# Patient Record
Sex: Male | Born: 1935 | Race: White | Hispanic: No | Smoking: Never smoker
Health system: Southern US, Community
[De-identification: ages and names within clinical notes are randomized; demographics above are authoritative.]

## PROBLEM LIST (undated history)

## (undated) DIAGNOSIS — Z8719 Personal history of other diseases of the digestive system: Secondary | ICD-10-CM

## (undated) DIAGNOSIS — IMO0001 Reserved for inherently not codable concepts without codable children: Secondary | ICD-10-CM

## (undated) DIAGNOSIS — I509 Heart failure, unspecified: Secondary | ICD-10-CM

## (undated) DIAGNOSIS — K5790 Diverticulosis of intestine, part unspecified, without perforation or abscess without bleeding: Secondary | ICD-10-CM

## (undated) DIAGNOSIS — I453 Trifascicular block: Secondary | ICD-10-CM

## (undated) DIAGNOSIS — E785 Hyperlipidemia, unspecified: Secondary | ICD-10-CM

## (undated) DIAGNOSIS — D469 Myelodysplastic syndrome, unspecified: Secondary | ICD-10-CM

## (undated) DIAGNOSIS — Z9289 Personal history of other medical treatment: Secondary | ICD-10-CM

## (undated) DIAGNOSIS — M1990 Unspecified osteoarthritis, unspecified site: Secondary | ICD-10-CM

## (undated) DIAGNOSIS — M722 Plantar fascial fibromatosis: Secondary | ICD-10-CM

## (undated) DIAGNOSIS — I351 Nonrheumatic aortic (valve) insufficiency: Secondary | ICD-10-CM

## (undated) DIAGNOSIS — J45909 Unspecified asthma, uncomplicated: Secondary | ICD-10-CM

## (undated) DIAGNOSIS — I2510 Atherosclerotic heart disease of native coronary artery without angina pectoris: Secondary | ICD-10-CM

## (undated) DIAGNOSIS — I10 Essential (primary) hypertension: Secondary | ICD-10-CM

## (undated) HISTORY — DX: Unspecified asthma, uncomplicated: J45.909

## (undated) HISTORY — PX: TONSILLECTOMY: SUR1361

## (undated) HISTORY — PX: BACK SURGERY: SHX140

## (undated) HISTORY — PX: CATARACT EXTRACTION: SUR2

---

## 1994-11-02 HISTORY — PX: HEMORRHOID SURGERY: SHX153

## 1994-11-02 HISTORY — PX: INGUINAL HERNIA REPAIR: SUR1180

## 1995-12-06 ENCOUNTER — Encounter: Payer: Self-pay | Admitting: Gastroenterology

## 1998-10-01 ENCOUNTER — Encounter: Admission: RE | Admit: 1998-10-01 | Discharge: 1998-12-30 | Payer: Self-pay | Admitting: Endocrinology

## 1999-04-23 ENCOUNTER — Encounter: Payer: Self-pay | Admitting: Endocrinology

## 1999-04-23 ENCOUNTER — Ambulatory Visit (HOSPITAL_COMMUNITY): Admission: RE | Admit: 1999-04-23 | Discharge: 1999-04-23 | Payer: Self-pay | Admitting: Endocrinology

## 2000-12-01 ENCOUNTER — Ambulatory Visit (HOSPITAL_COMMUNITY): Admission: RE | Admit: 2000-12-01 | Discharge: 2000-12-01 | Payer: Self-pay | Admitting: Gastroenterology

## 2001-08-05 ENCOUNTER — Ambulatory Visit (HOSPITAL_COMMUNITY): Admission: RE | Admit: 2001-08-05 | Discharge: 2001-08-05 | Payer: Self-pay | Admitting: Endocrinology

## 2001-11-02 DIAGNOSIS — Z8719 Personal history of other diseases of the digestive system: Secondary | ICD-10-CM

## 2001-11-02 HISTORY — PX: LUMBAR LAMINECTOMY: SHX95

## 2001-11-02 HISTORY — DX: Personal history of other diseases of the digestive system: Z87.19

## 2001-12-22 ENCOUNTER — Ambulatory Visit (HOSPITAL_COMMUNITY): Admission: RE | Admit: 2001-12-22 | Discharge: 2001-12-22 | Payer: Self-pay | Admitting: Specialist

## 2001-12-22 ENCOUNTER — Encounter: Payer: Self-pay | Admitting: Specialist

## 2002-09-21 ENCOUNTER — Inpatient Hospital Stay (HOSPITAL_COMMUNITY): Admission: RE | Admit: 2002-09-21 | Discharge: 2002-09-23 | Payer: Self-pay | Admitting: Neurosurgery

## 2002-09-21 ENCOUNTER — Encounter: Payer: Self-pay | Admitting: Neurosurgery

## 2003-01-29 ENCOUNTER — Observation Stay (HOSPITAL_COMMUNITY): Admission: EM | Admit: 2003-01-29 | Discharge: 2003-01-30 | Payer: Self-pay | Admitting: Emergency Medicine

## 2003-09-04 ENCOUNTER — Encounter: Admission: RE | Admit: 2003-09-04 | Discharge: 2003-12-03 | Payer: Self-pay | Admitting: Neurosurgery

## 2005-05-18 ENCOUNTER — Ambulatory Visit (HOSPITAL_COMMUNITY): Admission: RE | Admit: 2005-05-18 | Discharge: 2005-05-18 | Payer: Self-pay | Admitting: Orthopedic Surgery

## 2008-08-23 ENCOUNTER — Encounter: Payer: Self-pay | Admitting: Internal Medicine

## 2008-08-24 ENCOUNTER — Encounter: Payer: Self-pay | Admitting: Internal Medicine

## 2009-08-22 ENCOUNTER — Encounter: Payer: Self-pay | Admitting: Internal Medicine

## 2009-08-23 ENCOUNTER — Encounter: Payer: Self-pay | Admitting: Internal Medicine

## 2010-01-07 ENCOUNTER — Encounter: Payer: Self-pay | Admitting: Cardiology

## 2010-03-11 ENCOUNTER — Ambulatory Visit: Payer: Self-pay | Admitting: Vascular Surgery

## 2010-04-15 DIAGNOSIS — I2510 Atherosclerotic heart disease of native coronary artery without angina pectoris: Secondary | ICD-10-CM

## 2010-04-15 DIAGNOSIS — I10 Essential (primary) hypertension: Secondary | ICD-10-CM | POA: Insufficient documentation

## 2010-04-16 ENCOUNTER — Ambulatory Visit: Payer: Self-pay | Admitting: Internal Medicine

## 2010-04-16 DIAGNOSIS — R011 Cardiac murmur, unspecified: Secondary | ICD-10-CM

## 2010-04-16 DIAGNOSIS — I453 Trifascicular block: Secondary | ICD-10-CM

## 2010-05-13 ENCOUNTER — Ambulatory Visit: Payer: Self-pay

## 2010-05-13 ENCOUNTER — Ambulatory Visit: Payer: Self-pay | Admitting: Cardiology

## 2010-05-13 ENCOUNTER — Encounter: Payer: Self-pay | Admitting: Internal Medicine

## 2010-05-13 ENCOUNTER — Ambulatory Visit (HOSPITAL_COMMUNITY): Admission: RE | Admit: 2010-05-13 | Discharge: 2010-05-13 | Payer: Self-pay | Admitting: Internal Medicine

## 2010-06-05 ENCOUNTER — Telehealth: Payer: Self-pay | Admitting: Internal Medicine

## 2010-07-03 ENCOUNTER — Ambulatory Visit: Payer: Self-pay | Admitting: Internal Medicine

## 2010-07-15 ENCOUNTER — Encounter: Payer: Self-pay | Admitting: Internal Medicine

## 2010-07-15 LAB — CBC & DIFF AND RETIC
BASO%: 0.2 % (ref 0.0–2.0)
Basophils Absolute: 0 10*3/uL (ref 0.0–0.1)
EOS%: 1.3 % (ref 0.0–7.0)
HCT: 35.7 % — ABNORMAL LOW (ref 38.4–49.9)
HGB: 12 g/dL — ABNORMAL LOW (ref 13.0–17.1)
MCH: 32.6 pg (ref 27.2–33.4)
MCHC: 33.6 g/dL (ref 32.0–36.0)
MCV: 97 fL (ref 79.3–98.0)
MONO%: 10.8 % (ref 0.0–14.0)
NEUT%: 62 % (ref 39.0–75.0)
lymph#: 1.4 10*3/uL (ref 0.9–3.3)

## 2010-07-17 LAB — COMPREHENSIVE METABOLIC PANEL
BUN: 22 mg/dL (ref 6–23)
CO2: 27 mEq/L (ref 19–32)
Calcium: 9 mg/dL (ref 8.4–10.5)
Chloride: 102 mEq/L (ref 96–112)
Creatinine, Ser: 0.94 mg/dL (ref 0.40–1.50)
Glucose, Bld: 80 mg/dL (ref 70–99)
Total Bilirubin: 0.5 mg/dL (ref 0.3–1.2)

## 2010-07-17 LAB — FOLATE: Folate: >20 ng/mL

## 2010-07-17 LAB — PROTEIN ELECTROPHORESIS, SERUM
Albumin ELP: 62 % (ref 55.8–66.1)
Beta Globulin: 5.7 % (ref 4.7–7.2)
Total Protein, Serum Electrophoresis: 6.1 g/dL (ref 6.0–8.3)

## 2010-07-17 LAB — LACTATE DEHYDROGENASE: LDH: 156 U/L (ref 94–250)

## 2010-07-17 LAB — FERRITIN: Ferritin: 38 ng/mL (ref 22–322)

## 2010-07-17 LAB — VITAMIN B12: Vitamin B-12: 871 pg/mL (ref 211–911)

## 2010-07-17 LAB — IRON AND TIBC: UIBC: 197 ug/dL

## 2010-08-05 ENCOUNTER — Ambulatory Visit: Payer: Self-pay | Admitting: Internal Medicine

## 2010-08-05 LAB — CBC WITH DIFFERENTIAL/PLATELET
BASO%: 0.2 % (ref 0.0–2.0)
HCT: 35.5 % — ABNORMAL LOW (ref 38.4–49.9)
LYMPH%: 20.1 % (ref 14.0–49.0)
MCHC: 33.5 g/dL (ref 32.0–36.0)
MCV: 97.8 fL (ref 79.3–98.0)
MONO#: 0.7 10*3/uL (ref 0.1–0.9)
MONO%: 11.5 % (ref 0.0–14.0)
NEUT%: 66.9 % (ref 39.0–75.0)
Platelets: 120 10*3/uL — ABNORMAL LOW (ref 140–400)
RBC: 3.63 10*6/uL — ABNORMAL LOW (ref 4.20–5.82)
nRBC: 0 % (ref 0–0)

## 2010-11-07 ENCOUNTER — Ambulatory Visit: Payer: Self-pay | Admitting: Internal Medicine

## 2010-12-02 NOTE — Letter (Signed)
Summary: [facility]  [facility]   Imported By: Marylou Mccoy 08/06/2010 10:34:35  _____________________________________________________________________  External Attachment:    Type:   Image     Comment:   External Document

## 2010-12-02 NOTE — Progress Notes (Signed)
Summary: test results( EKG)  Phone Note Call from Patient Call back at Work Phone [phone]   Caller: Patient Summary of Call: Ttest results  Initial call taken by: Judie Grieve,  June 05, 2010 1:19 PM  Follow-up for Phone Call        pt request 2 copies of EKG be mailed to him, Deitra Mayo, RN  June 05, 2010 4:33 PM

## 2010-12-02 NOTE — Assessment & Plan Note (Signed)
Summary: NP6/ Victor Green HAS [employer]/ GD   Visit Type:  new Victor Green visit Primary Provider:  Lucianne Muss  CC:  Victor Green states he needed to get a cardiologist...no complaints today.  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 Has a h/o  HTN and HL previously followed by Dr. Corinda Gubler years ago. He presents to establish long-term cardiac care  Follows with a cardiologist in the [facility] in [place] once a year has been followed very closely.  He provides extensive records from them which I have reviewed.   In 2009 had complete cardiac w/u:  -ECHO 70% mild AI -Myoview 89% no ischemia or scar (has never had a cath) -EBCT with significant coronary ca2+ (82%) -Carotid u/s 2010 40% bilat  Recently found to have Baker cyst in R knee and been having problems with swelling in RLE only. [facility] and had u/s and no DVT. Denies CP or SOB. No orthopnea or PND Exercises 1-2/week on recumbent bicycle. BP and lipids followed by Dr. Lucianne Muss.   Current Medications (verified): 1)  Lipitor 10 Mg Tabs (Atorvastatin Calcium) .... 1/2 Tab Once Daily 2)  Diovan 160 Mg Tabs (Valsartan) .Marland Kitchen.. 1 Tab Two Times A Day 3)  Amiloride Hcl 5 Mg Tabs (Amiloride Hcl) .Marland Kitchen.. 1 Tab Once Daily 4)  Cardura 4 Mg Tabs (Doxazosin Mesylate) .... Dose Titrated.Kinston Reddinger Takes 1-2 Tabs Once Daily 5)  Allegra 180 Mg Tabs (Fexofenadine Hcl) .Marland Kitchen.. 1 Tab Once Daily 6)  Multivitamins   Tabs (Multiple Vitamin) .Marland Kitchen.. 1 Tab Once Daily 7)  B Complex  Tabs (B Complex Vitamins) .Marland Kitchen.. 1 Tab Once Daily 8)  Metamucil 30.9 % Powd (Psyllium) .... W/each Meal 9)  Colace 100 Mg Caps (Docusate Sodium) .Marland Kitchen.. 1 Cap Once Daily 10)  Miralax  Powd (Polyethylene Glycol 3350) .... Take As Directed As Needed 11)  Glucosamine 500 Mg Caps (Glucosamine Sulfate) .Marland Kitchen.. 1 Tab W/each Meal 12)  Aspirin 81 Mg Tbec (Aspirin) .... Take One Tablet By Mouth Daily 13)  Alphagan P 0.15 % Soln (Brimonidine Tartrate) .... Use As Directed Two  Times A Day 14)  Trusopt 2 % Soln (Dorzolamide Hcl) .... Use As Directed 15)  Cytotec 200 Mcg Tabs (Misoprostol) .Marland Kitchen.. 1 Tab Once Daily 16)  Carafate 1 Gm Tabs (Sucralfate) .Marland Kitchen.. 1 Tab Once Daily 17)  Lodine 300 Mg .Marland Kitchen.. 1 Tab Once Daily For Reflux  Allergies (verified): No Known Drug Allergies  Past History:  Past Surgical History: Last updated: 04/15/2010 Tonsillectomy hemorrhoidectomy 1996 right herniorrhaphy 1996 lumbar laminectomy 2003 left cataract surgery  Family History: Last updated: Apr 28, 2010 Father: deceased @ 73 of mesenteric artery thrombosis after prostate surgery. Mother: deceased @ 68 of colon cancer  Social History: Last updated: 04/15/2010 Full Time Married  Tobacco Use - No.  Alcohol Use - no  Risk Factors: Smoking Status: never (04/15/2010)  Past Medical History: Current Problems:  HYPERTENSION (ICD-401.9) CAD (ICD-414.00) with extensive of coronary calcification by EBCT 2009   --Myoview Normal 2009 Hyprlipidemia.  Aoric insufficiency    --ECHO 2009 EF 70% mild AI  Trifascicular block    --1 AVB (264 ms), RBBB, LAFB) H/O upper GI bleed due to H. pyloric-positive ulcer 2003 Diverticulosis w/remote history of diverticulitis Chronic plantar faciitis Allergic  Rhinitis responding well to desenitation injections Allergy to tetanus vaccine Glaucoma Osteoarthritis of both knees. FH reviewed for relevance  Family History: Reviewed history from 04/15/2010 and no changes required. Father: deceased @ 32 of  mesenteric artery thrombosis after prostate surgery. Mother: deceased @ 71 of colon cancer  Social History: Reviewed history from 04/15/2010 and no changes required. Full Time Married  Tobacco Use - No.  Alcohol Use - no  Review of Systems       As per HPI and past medical history; otherwise all systems negative.   Vital Signs:  Patient profile:   77 year old male Height:      69 inches Weight:      172 pounds BMI:     25.49 Pulse  rate:   71 / minute Pulse rhythm:   irregular BP sitting:   106 / 60  (left arm) Cuff size:   regular  Vitals Entered By: Danielle Rankin, CMA (April 16, 2010 10:45 AM)  Physical Exam  General:  Gen: well appearing. no resp difficulty HEENT: normal Neck: supple. no JVD. Carotids 2+ bilat; no bruits. No lymphadenopathy or thryomegaly appreciated. Cor: PMI nondisplaced. Regular rate & rhythm. No rubs, gallops, murmur. Lungs: clear Abdomen: soft, nontender, nondistended. No hepatosplenomegaly. No bruits or masses. Good bowel sounds. Extremities: no cyanosis, clubbing, rash, 3+ edema with some varicosities on R no swelling on L Neuro: alert & orientedx3, cranial nerves grossly intact. moves all 4 extremities w/o difficulty. affect pleasant    Impression & Recommendations:  Problem # 1:  CAD (ICD-414.00) Stable. Asymptomatic. Continue aggressive RF management.  Orders: EKG w/ Interpretation (93000) Echocardiogram (Echo)  Problem # 2:  MURMUR (ICD-785.2) Has soft ejection murmur as RUSB suggestive of mild AS and squeaky systolic murmur at apex. Will need echo to further evaluate. I was not able to hear his AI  Orders: EKG w/ Interpretation (93000) Echocardiogram (Echo)  Problem # 3:  TRIFASCICULAR BLOCK (ICD-426.54) Stable. no indication for pacer at this point. Will follow.   Total time spent reviewing records, examining patient and doing note over 1 hour.   Patient Instructions: 1)  Your physician has requested that you have an echocardiogram.  Echocardiography is a painless test that uses sound waves to create images of your heart. It provides your doctor with information about the size and shape of your heart and how well your heart's chambers and valves are working.  This procedure takes approximately one hour. There are no restrictions for this procedure. 2)  Your physician wants you to follow-up in:  1 year. You will receive a reminder letter in the mail two months in advance.  If you don't receive a letter, please call our office to schedule the follow-up appointment.

## 2010-12-02 NOTE — Consult Note (Signed)
Summary: Patient Referral Request   Patient Referral Request   Imported By: Roderic Ovens 04/28/2010 12:31:08  _____________________________________________________________________  External Attachment:    Type:   Image     Comment:   External Document

## 2010-12-02 NOTE — Miscellaneous (Signed)
Summary: Influenza Vaccination  Influenza Vaccination   Imported By: Roderic Ovens 06/27/2010 13:01:43  _____________________________________________________________________  External Attachment:    Type:   Image     Comment:   External Document

## 2010-12-02 NOTE — Letter (Signed)
Summary: [facility]  [facility]   Imported By: Marylou Mccoy 04/15/2010 14:06:22  _____________________________________________________________________  External Attachment:    Type:   Image     Comment:   External Document

## 2010-12-02 NOTE — Letter (Signed)
Summary: [facility]  [facility]   Imported By: Marylou Mccoy 04/26/2010 10:34:21  _____________________________________________________________________  External Attachment:    Type:   Image     Comment:   External Document

## 2010-12-05 NOTE — Letter (Signed)
Summary: [facility]  [facility]   Imported By: Marylou Mccoy 04/15/2010 14:19:26  _____________________________________________________________________  External Attachment:    Type:   Image     Comment:   External Document

## 2010-12-05 NOTE — Letter (Signed)
Summary: [facility]  [facility]   Imported By: Marylou Mccoy 04/24/2010 11:34:11  _____________________________________________________________________  External Attachment:    Type:   Image     Comment:   External Document

## 2010-12-05 NOTE — Procedures (Signed)
Summary: [facility] Spirometry Report   [facility] Spirometry Report   Imported By: Roderic Ovens 06/27/2010 13:02:54  _____________________________________________________________________  External Attachment:    Type:   Image     Comment:   External Document

## 2011-03-17 NOTE — Procedures (Signed)
DUPLEX DEEP VENOUS EXAM - LOWER EXTREMITY   INDICATION:  Right lower extremity swelling for approximately 1 week.   HISTORY:  Edema:  Right lower extremity/foot, approximately 1 week.  Trauma/Surgery:  No.  Pain:  No.  PE:  No.  Previous DVT:  No.  Anticoagulants:  Baby aspirin.  Other:   DUPLEX EXAM:                CFV   SFV   PopV  PTV    GSV                R  L  R  L  R  L  R   L  R  L  Thrombosis    o  o  o     o     o      o  Spontaneous   +  +  +     +     +      +  Phasic        +  +  +     +     +      +  Augmentation  +  +  +     +     +      +  Compressible  +  +  +     +     +      +  Competent     +  +  +     +     +      +   Legend:  + - yes  o - no  p - partial  D - decreased   IMPRESSION:  1. No evidence of deep or superficial venous thrombosis in the right      lower extremity or left common femoral vein.  2. Evidence of Baker's cyst in right popliteal fossa measuring 2.45 cm      X 4.03 cm.   Preliminary results phoned to Dr. Lucianne Muss.    _____________________________  Quita Skye. Hart Rochester, M.D.   AS/MEDQ  D:  03/11/2010  T:  03/11/2010  Job:  [id number]

## 2011-03-20 NOTE — Discharge Summary (Signed)
   NAMESKYE, RODARTE                        ACCOUNT NO.:  [id number]   MEDICAL RECORD NO.:  [id number]                   PATIENT TYPE:  INP   LOCATION:  0480                                 FACILITY:  [facility]   PHYSICIAN:  Reather Littler, M.D.                    DATE OF BIRTH:  Oct 17, 1920   DATE OF ADMISSION:  01/29/2003  DATE OF DISCHARGE:                                 DISCHARGE SUMMARY   HISTORY:  The patient was admitted with dark bowel movements and weakness  for one day.  The patient had been previously on Bextra and had a previous  history of bleeding duodenal ulcer in 1971.  On arrival to the emergency  room his hemoglobin was 9.8 with a baseline previously of 14.4.   HOSPITAL COURSE:  The patient was admitted and IV fluids were started.  Endoscopy showed a nonbleeding duodenal ulcer with some gastritis.  The  patient's hemoglobin stayed fairly stable around 9 and dropped slightly to  8.5 on discharge but he was clinically feeling much better and was  nontender.   LABORATORIES:  Hemoglobin ranged between 8.5 and 10.1 with a final one 8.5.  His sodium was 128.  Otherwise, laboratories normal.   DISCHARGE DIAGNOSES:  Bleeding duodenal ulcer secondary to Bextra.   DISCHARGE INSTRUCTIONS:  To avoid aspirin and Bextra.  Resume all other  medications as before and use Tylenol OTC for pain.  Also start ferrous  sulfate one daily.  To follow up in the office in one week and with Bernette Redbird, M.D. as directed.  Diet to be low sodium, low residue, and no  alcohol for 30 days.                                               Reather Littler, M.D.    AK/MEDQ  D:  01/30/2003  T:  01/30/2003  Job:  [id number]   cc:   Bernette Redbird, M.D.  [address]  [place], [place] [address]  Fax: [phone]

## 2011-03-20 NOTE — H&P (Signed)
NAMEBUEL, MOLDER                        ACCOUNT NO.:  [id number]   MEDICAL RECORD NO.:  [id number]                   PATIENT TYPE:  INP   LOCATION:  0480                                 FACILITY:  [facility]   PHYSICIAN:  Reather Littler, M.D.                    DATE OF BIRTH:  April 22, 1920   DATE OF ADMISSION:  01/29/2003  DATE OF DISCHARGE:                                HISTORY & PHYSICAL   CHIEF COMPLAINT:  Black stools.   HISTORY:  This is an 77 year old patient who started complaining of black  stools the day before admission in the morning.  He had two episodes  associated with mild upper abdominal discomfort.  He was not feeling  lightheaded or weak and his blood pressure at home was stable.  He was given  Nexium and liquid antacids but at about 10 o'clock in the evening he had  another dark bowel movement with some associated reddish blood also.  He was  feeling somewhat weaker and therefore was brought to be seen in the  emergency room where his blood pressure was normal but hemoglobin was down  to 9.8.  The patient normally has a hemoglobin of 14.4.   He had been taking Bextra 20 mg a day for almost 2 months for his low back.  He also has had osteoarthritis of his neck.  The patient also apparently has  a past history of bleeding duodenal ulcer since 1971 of unknown etiology.   MEDICATIONS:  1. Diovan 160 mg.  2. Amiloride 5 mg b.i.d.  3. Bextra 20 mg daily.  4. Flonase, Allegra, and Valium p.r.n.   ALLERGIES:  None.   PAST MEDICAL HISTORY:  His past history includes hernia surgery and bleeding  ulcer.   FAMILY HISTORY:  Positive for diabetes.  No other significant family  history.   PERSONAL HISTORY:  He drinks 2-3 alcohol drinks 5 days a week or so.   REVIEW OF SYSTEMS:  He has hypertension which is fairly well controlled,  mild BPH, allergic rhinitis, __________ insomnia.  He has neck and back  pain.  He has had no thyroid problems.  His sodium has occasionally  been  slightly low chronically with no etiology found.   PHYSICAL EXAMINATION:  VITAL SIGNS:  The patient weighs about 185 pounds.  His pulse is 90, blood pressure 160/78.  GENERAL:  He is alert and cooperative.  He is obviously built.  HEENT:  Grossly normal.  NECK:  No carotid bruit.  CARDIAC:  Heart sounds normal.  LUNGS:  Clear.  ABDOMEN:  No distention, mass, or tenderness.  RECTAL:  As per ER physician.  EXTREMITIES:  Normal.   ASSESSMENT:  He has bleeding likely to be from Bextra.   PLAN:  Would be to hydrate him, watch his hemoglobin and get GI consultation  urgently for endoscopy, monitor hemoglobin every 6 hours.  Reather Littler, M.D.    AK/MEDQ  D:  01/29/2003  T:  01/29/2003  Job:  [id number]

## 2011-03-20 NOTE — Procedures (Signed)
[facility]  Patient:    Victor Green, Victor Green                     MRN: [id number] Proc. Date: 12/01/00 Adm. Date:  [id number] Attending:  Rich Brave CC:         Reather Littler, M.D.   Procedure Report  PROCEDURE:  Colonoscopy.  INDICATION:  A 77 year old with family history of colon cancer.  FINDINGS:  Extensive diverticulosis.  DESCRIPTION OF PROCEDURE:  The nature, purpose, and risks of the procedure were familiar to the patient, who provided written consent.  Sedation for this procedure and the upper endoscopy which preceded it totalled fentanyl 40 mcg and Versed 4 mg IV.  He also received antibiotic prophylaxis with ampicillin 1 g, which went in just prior to the endoscopy, and gentamicin 80 mg, which went in during the colonoscopy.  This is because of a history of aortic insufficiency.  Digital rectal exam was unremarkable.  The Olympus colonoscope was advanced to the cecum without difficulty, and pullback was then performed.  The quality of prep was very good, and it is felt that all areas were well-seen.  No polyps, masses, colitis, or vascular malformations were observed.  There was extensive diverticulosis throughout the colon, especially the left colon, but to some degree in the transverse and, I believe, even in the ascending colon.  Retroflexion in the rectum was normal except for some scope trauma on one of the valves of [place].  No biopsies were obtained during this exam, which the patient tolerated well and without apparent complication.  IMPRESSION:  Normal screening colonoscopy in a patient with a family history of colon cancer.  RECOMMENDATIONS:  In view of the patients somewhat advanced age and the fact that he probably would not require another colonoscopy for five years, and since he has now had two colonoscopies five years apart, which were both negative for polyps, I think a strong case could be made for no  further surveillance examinations.  I would leave the final decision regarding this to the discretion of the patient and his primary physician. DD:  12/01/00 TD:  12/01/00 Job: [id number]

## 2011-03-20 NOTE — Consult Note (Signed)
Victor Green, Victor Green                        ACCOUNT NO.:  [id number]   MEDICAL RECORD NO.:  [id number]                   PATIENT TYPE:  INP   LOCATION:  0480                                 FACILITY:  [facility]   PHYSICIAN:  Althea Grimmer. Luther Parody, M.D.            DATE OF BIRTH:  14-Dec-1919   DATE OF CONSULTATION:  01/29/2003  DATE OF DISCHARGE:                                   CONSULTATION   REASON FOR CONSULTATION:  Victor Green is an 77 year old male in generally  good health for his age, who presented to Reather Littler, M.D. yesterday  complaining of two episodes of tarry stool.  He was placed on Nexium as an  outpatient but then admitted to the hospital.  He has been taking Bextra 20  mg daily along with Carafate 1 g b.i.d. (the latter as a preventative) for  pain related to osteoarthritis and lumbar stenosis.  He has a prior history  of a bleeding duodenal ulcer in 1971.  He has noticed some epigastric  discomfort in the last several days.  He does not feel lightheaded or dizzy  or have any chest pain.  He has not had any melena this morning.  His  baseline hemoglobin is approximately 14.  The hemoglobin this morning was  registered at 8.9.  The patient underwent endoscopic studies by Bernette Redbird, M.D. in January 2002.  A colonoscopy demonstrated extensive  diverticula, worse on the left side.  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  Victor Green does report that he had a bleeding duodenal  ulcer that required transfusions in 1971.  He denies other gastrointestinal  symptoms at this time.   PAST MEDICAL HISTORY:  1. Hypertension.  2. Osteoarthritis.  3. Lumbar stenosis.   FAMILY HISTORY:  Noncontributory.   SOCIAL HISTORY:  Nonsmoker, social drinker.   REVIEW OF SYSTEMS:  GENERAL:  No weight loss or night sweats.  ENDOCRINE:  No history of diabetes or thyroid problems.  SKIN:  No rash or  pruritus.  EYES:  No icterus or change in vision.  ENT:  No aphthous ulcers or chronic  sore throat.  RESPIRATORY:  No shortness of breath, cough, or wheezing.  CARDIAC:  No chest pain, palpitations, or history of valvular heart disease.  GI:  As above.  GU:  No dysuria or hematuria.  Remainder of review of  systems is negative.   PHYSICAL EXAMINATION:  GENERAL:  He is a well-developed, well-nourished,  alert, adult male in no acute distress.  VITAL SIGNS:  Afebrile, blood pressure 161/80, pulse 88 and regular.  Exam  as below.  HEENT:  Eyes anicteric.  Oropharynx unremarkable.  NECK:  Supple without thyromegaly.  There is no cervical or inguinal  adenopathy.  CHEST:  Clear.  HEART SOUNDS:  Regular rate and rhythm without murmurs, rubs, or gallops.  ABDOMEN:  Soft with normal bowel sounds and without mass or organomegaly.  There is minimal epigastric tenderness to deep palpation.  There is no  rebound or hernia.  RECTAL:  Not performed.  EXTREMITIES:  Without cyanosis, clubbing, edema, or rash.  He has  osteoarthritic changes of his knees.   IMPRESSION:  An 77 year old male with significant new anemia and melena.  He  has been taking Bextra along with Carafate.  I suspect he has nonsteroidal-  induced gastritis or ulcer disease.  Upper endoscopy is in order.   PLAN:  Upper endoscopy is advised to the patient and discussed in terms of  technique, perforation, and risk of complications, including bleeding and  perforation.  He agrees to proceed.  It will be scheduled immediately.  Further recommendations will follow the completion of this procedure.  He  has already received IV Protonix which will be continued.                                               Althea Grimmer. Luther Parody, M.D.    PJS/MEDQ  D:  01/29/2003  T:  01/29/2003  Job:  [id number]   cc:   Bernette Redbird, M.D.  [address]  [place], [place] [address]  Fax: [phone]   Reather Littler, M.D.  [address]  [address]  [place]  [place] [address]  Fax: [phone]

## 2011-03-20 NOTE — Op Note (Signed)
NAME:  Victor Green, Victor Green                        ACCOUNT NO.:  [id number]   MEDICAL RECORD NO.:  [id number]                   PATIENT TYPE:  INP   LOCATION:  NA                                   FACILITY:  [facility]   PHYSICIAN:  Hewitt Shorts, M.D.            DATE OF BIRTH:  1920-05-31   DATE OF PROCEDURE:  09/21/2002  DATE OF DISCHARGE:                                 OPERATIVE REPORT   PREOPERATIVE DIAGNOSIS:  Multilevel, multifactorial lumbar stenosis, left L3-  4 disk herniation as well as lumbar spondylosis and lumbar degenerative disk  disease.   POSTOPERATIVE DIAGNOSIS:  Multilevel, multifactorial lumbar stenosis, left  L3-4 disk herniation as well as lumbar spondylosis and lumbar degenerative  disk disease.   PROCEDURE:  L2 through L5 lumbar laminectomy and left L3-4 microdiskectomy.   SURGEON:  Hewitt Shorts, M.D.   ASSISTANT:  Stefani Dama, M.D.   ANESTHESIA:  General endotracheal anesthesia.   INDICATIONS FOR PROCEDURE:  The patient is an 77 year-old man who presented  with neurogenic claudication.  On examination he had weakness of the right  extensor hallicis longus.  MRI scan showed multilevel, multifactorial lumbar  stenosis as well as a left L3-4 disk herniation. The decision was made to  proceed with decompressive laminectomy as well as microdiskectomy.   DESCRIPTION OF PROCEDURE:  The patient was brought to the operating room and  placed under general endotracheal anesthesia.  The patient was turned to the  prone region and the lumbar region was prepped with Betadine soak and  solution and draped in sterile fashion.  The midline was infiltrated with  local anesthesia with epinephrine.  X-rays were taken and the L3-4 and L4-5  levels were identified.  A midline incision was made and carried down to the  subcutaneous tissue.  Bipolar cautery and electrocautery were used to  maintain hemostasis.  Dissection was carried down to the lumbar fascia which  was incised bilaterally and the paraspinal muscles were dissected along with  spinous process of the lamina in a subperiosteal fashion.  An x-ray was  taken to localize the spinous process of the lamina and then we proceeded  with a multilevel decompressive lumbar laminectomy using double Leksell  rongeurs, the Ragnell drill and the Kerrison punches. Care was taken to  leave the underlying thecal sac undisturbed. The stenosis was quite notable  both at the L3-4 as well as the L4-5 levels.  To help localize the disk  herniation another x-ray was taken.  The lamina was extended rostrally so as  to be able to expose the disk herniation.  The inferior aspect of L2 had to  be removed as well.  The microscope was draped and brought onto the field to  provide verification, lamination and visualization.  The diskectomy was  performed with microdissection and microsurgical technique. We identified  the left L2 and L3 nerve roots as well as the  left L4 nerve root.  We  examined the eventual epidural space adjacent to the left L3 nerve root as  it exited beneath the left L3 pedicle.  The disk herniation was visualized  and was spondylitic and removed in a piecemeal fashion.  Good decompression  fo the thecal sac and the nerve root was achieved.  We did not enter into  the disk space but rather removed the fragment within the spinal canal.  Once that diskectomy was completed we again checked to ensure that good  decompression of the thecal sac and nerve roots had been achieved  bilaterally.  Gelfoam was placed in the laminectomy defect that had been  soaked in thrombin.  Good hemostasis was noted throughout and we then  proceeded with closure.  The paraspinal muscle was reapproximated with  interrupted undyed #1 Vicryl sutures.  The deep fascia was were  reapproximated with interrupted undyed #1 Vicryl sutures.  The subcutaneous  layer was closed with interrupted 2-0 Vicryl sutures.  The skin was   reapproximated with Dermabond.  The patient tolerated the procedure well.  Estimated blood loss was 250 cc.  Sponge and needle counts were correct.  Following surgery the patient was to be returned back to the supine  position, reversal of anesthetic, extubated and transferred to the recovery  room for further care.                                               Hewitt Shorts, M.D.    RWN/MEDQ  D:  09/21/2002  T:  09/21/2002  Job:  [id number]

## 2011-08-27 ENCOUNTER — Ambulatory Visit (HOSPITAL_COMMUNITY)
Admission: RE | Admit: 2011-08-27 | Discharge: 2011-08-27 | Disposition: A | Discharge status: Home / Self Care | Payer: 59 | Source: Ambulatory Visit | Attending: Internal Medicine | Admitting: Internal Medicine

## 2011-08-27 ENCOUNTER — Encounter (HOSPITAL_COMMUNITY): Payer: Self-pay

## 2011-08-27 DIAGNOSIS — R609 Edema, unspecified: Secondary | ICD-10-CM | POA: Insufficient documentation

## 2011-08-27 DIAGNOSIS — I10 Essential (primary) hypertension: Secondary | ICD-10-CM | POA: Insufficient documentation

## 2011-08-27 DIAGNOSIS — I2510 Atherosclerotic heart disease of native coronary artery without angina pectoris: Secondary | ICD-10-CM | POA: Insufficient documentation

## 2011-08-27 HISTORY — DX: Personal history of other diseases of the digestive system: Z87.19

## 2011-08-27 HISTORY — DX: Trifascicular block: I45.3

## 2011-08-27 HISTORY — DX: Atherosclerotic heart disease of native coronary artery without angina pectoris: I25.10

## 2011-08-27 HISTORY — DX: Essential (primary) hypertension: I10

## 2011-08-27 HISTORY — DX: Nonrheumatic aortic (valve) insufficiency: I35.1

## 2011-08-27 HISTORY — DX: Diverticulosis of intestine, part unspecified, without perforation or abscess without bleeding: K57.90

## 2011-08-27 HISTORY — DX: Unspecified osteoarthritis, unspecified site: M19.90

## 2011-08-27 HISTORY — DX: Plantar fascial fibromatosis: M72.2

## 2011-08-27 HISTORY — DX: Hyperlipidemia, unspecified: E78.5

## 2011-08-27 NOTE — Progress Notes (Signed)
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 Has a h/o HTN and HL previously followed by Dr. Corinda Gubler years ago.   Follows with a cardiologist in the [facility] in [place] once a year has been followed very closely. Cardiac history as follows:  In 2009 had complete cardiac w/u:  -ECHO 70% mild AI  -Myoview 89% no ischemia or scar (has never had a cath)  -EBCT with significant coronary ca2+ (82%)  -Carotid u/s 2010 40% bilat   When I saw him last year had an AS murmur. Echo 7/11 EF 60%. AoV sclerosis no stenosis mild AI.   Returns for routine f/u. Doing very. Still working 4 hours per day. Denies CP or SOB. Not exercising well due to severe OA in knees. No CHF symptoms.  Follows BP very closely and well controlled with SBP 120-140. Dr. Lucianne Muss following lipids.    Current outpatient prescriptions:ALPRAZolam (XANAX) 0.25 MG tablet, Take 0.25 mg by mouth daily.  , Disp: , Rfl: ;  aMILoride (MIDAMOR) 5 MG tablet, Take 5 mg by mouth daily.  , Disp: , Rfl: ;  amLODipine (NORVASC) 2.5 MG tablet, Take 2.5 mg by mouth daily.  , Disp: , Rfl: ;  aspirin 81 MG tablet, Take 81 mg by mouth daily.  , Disp: , Rfl: ;  atorvastatin (LIPITOR) 10 MG tablet, Take 10 mg by mouth daily.  , Disp: , Rfl:  Azelastine HCl (ASTELIN NA), Place 2 sprays into the nose 2 (two) times daily.  , Disp: , Rfl: ;  brimonidine (ALPHAGAN) 0.15 % ophthalmic solution, Place 1 drop into both eyes 2 (two) times daily.  , Disp: , Rfl: ;  bumetanide (BUMEX) 0.5 MG tablet, Take 0.5 mg by mouth daily.  , Disp: , Rfl: ;  diazepam (VALIUM) 2 MG tablet, Take 2 mg by mouth daily.  , Disp: , Rfl:  dorzolamide (TRUSOPT) 2 % ophthalmic solution, 1 drop as directed.  , Disp: , Rfl: ;  doxazosin (CARDURA) 4 MG tablet, Take 4 mg in AM and 2 mg in PM, Disp: , Rfl: ;  fexofenadine (ALLEGRA) 180 MG tablet, Take 180 mg by mouth daily. , Disp: , Rfl: ;  fluticasone (FLONASE) 50 MCG/ACT nasal spray,  Place 2 sprays into the nose 3 (three) times daily.  , Disp: , Rfl:  glucosamine-chondroitin 500-400 MG tablet, Take 1 tablet by mouth 3 (three) times daily.  , Disp: , Rfl: ;  levothyroxine (SYNTHROID, LEVOTHROID) 25 MCG tablet, Take 25 mcg by mouth daily.  , Disp: , Rfl: ;  Multiple Vitamin (MULTIVITAMIN) capsule, Take 1 capsule by mouth daily.  , Disp: , Rfl: ;  pantoprazole (PROTONIX) 40 MG tablet, Take 40 mg by mouth daily.  , Disp: , Rfl:  polyethylene glycol powder (MIRALAX) powder, Take 17 g by mouth as needed.  , Disp: , Rfl: ;  Psyllium (METAMUCIL) 30.9 % POWD, Take by mouth 3 (three) times daily with meals.  , Disp: , Rfl: ;  valsartan (DIOVAN) 320 MG tablet, Take 320 mg by mouth daily.  , Disp: , Rfl:    Allergies (verified):  No Known Drug Allergies   Past History:  HYPERTENSION (ICD-401.9)  CAD (ICD-414.00) with extensive of coronary calcification by EBCT 2009  --Myoview Normal 2009  Hyprlipidemia.  Aoric insufficiency  --ECHO 2009 EF 70% mild AI  Trifascicular block  --1 AVB (264 ms), RBBB, LAFB)  H/O upper GI bleed  due to H. pyloric-positive ulcer 2003  Diverticulosis w/remote history of diverticulitis  Chronic plantar faciitis  Allergic  Rhinitis responding well to desenitation injections  Allergy to tetanus vaccine  Glaucoma  Osteoarthritis of both knees.   Family History:  Last updated: 05-07-10  Father: deceased @ 29 of mesenteric artery thrombosis after prostate surgery.  Mother: deceased @ 72 of colon cancer   Social History:  Last updated: 04/15/2010  Full Time  Married  Tobacco Use - No.  Alcohol Use - no  Risk Factors:  Smoking Status: never (04/15/2010)    Family History:  Father: deceased @ 19 of mesenteric artery thrombosis after prostate surgery.  Mother: deceased @ 1 of colon cancer    Filed Vitals:   08/27/11 1219  BP: 106/48  Pulse: 70    Physical Exam  General: Elderly well appearing. no resp difficulty  HEENT: normal  Neck:  supple. no JVD. Carotids 2+ bilat; no bruits. No lymphadenopathy or thryomegaly appreciated.  Cor: PMI nondisplaced. Regular rate & rhythm. No rubs, gallops. 1/6 aortic systolic murmur.  Lungs: clear  Abdomen: soft, nontender, nondistended. No hepatosplenomegaly. No bruits or masses. Good bowel sounds.  Extremities: no cyanosis, clubbing, rash, 2+ ankle edema with some varicosities on R no swelling on L  Neuro: alert & orientedx3, cranial nerves grossly intact. moves all 4 extremities w/o difficulty. affect pleasant

## 2011-08-27 NOTE — Assessment & Plan Note (Signed)
Mild to moderate due to venous insufficiency. Suggested wearing compression hose.

## 2011-08-27 NOTE — Patient Instructions (Signed)
Your physician wants you to follow-up in: 1 year. You will receive a reminder letter in the mail two months in advance. If you don't receive a letter, please call our office to schedule the follow-up appointment.  

## 2011-08-27 NOTE — Assessment & Plan Note (Signed)
Blood pressure well controlled. Continue current regimen.  

## 2011-08-27 NOTE — Assessment & Plan Note (Signed)
Stable. Very mild. Asymptomatic. I would not pursue further imagine at this point.

## 2011-08-30 NOTE — Assessment & Plan Note (Signed)
No evidence of ischemia. Continue current regimen.   

## 2011-10-23 ENCOUNTER — Ambulatory Visit
Admission: RE | Admit: 2011-10-23 | Discharge: 2011-10-23 | Disposition: A | Discharge status: Home / Self Care | Payer: Medicare Other | Source: Ambulatory Visit | Attending: Endocrinology | Admitting: Endocrinology

## 2011-10-23 ENCOUNTER — Other Ambulatory Visit: Payer: Self-pay | Admitting: Endocrinology

## 2011-10-23 DIAGNOSIS — R05 Cough: Secondary | ICD-10-CM

## 2012-11-22 ENCOUNTER — Ambulatory Visit
Admission: RE | Admit: 2012-11-22 | Discharge: 2012-11-22 | Disposition: A | Discharge status: Home / Self Care | Payer: 59 | Source: Ambulatory Visit | Attending: Endocrinology | Admitting: Endocrinology

## 2012-11-22 ENCOUNTER — Other Ambulatory Visit: Payer: Self-pay | Admitting: Endocrinology

## 2012-11-22 DIAGNOSIS — R05 Cough: Secondary | ICD-10-CM

## 2012-11-22 DIAGNOSIS — R059 Cough, unspecified: Secondary | ICD-10-CM

## 2013-05-17 ENCOUNTER — Other Ambulatory Visit: Payer: Self-pay | Admitting: *Deleted

## 2013-05-17 DIAGNOSIS — D649 Anemia, unspecified: Secondary | ICD-10-CM

## 2013-05-17 DIAGNOSIS — I10 Essential (primary) hypertension: Secondary | ICD-10-CM

## 2013-05-18 ENCOUNTER — Other Ambulatory Visit (INDEPENDENT_AMBULATORY_CARE_PROVIDER_SITE_OTHER): Payer: 59

## 2013-05-18 ENCOUNTER — Ambulatory Visit (INDEPENDENT_AMBULATORY_CARE_PROVIDER_SITE_OTHER): Payer: 59 | Admitting: Endocrinology

## 2013-05-18 ENCOUNTER — Encounter: Payer: Self-pay | Admitting: Endocrinology

## 2013-05-18 VITALS — BP 115/60 | HR 62 | Ht 69.0 in | Wt 185.8 lb

## 2013-05-18 DIAGNOSIS — D649 Anemia, unspecified: Secondary | ICD-10-CM

## 2013-05-18 DIAGNOSIS — R6882 Decreased libido: Secondary | ICD-10-CM

## 2013-05-18 DIAGNOSIS — D509 Iron deficiency anemia, unspecified: Secondary | ICD-10-CM

## 2013-05-18 DIAGNOSIS — R609 Edema, unspecified: Secondary | ICD-10-CM

## 2013-05-18 DIAGNOSIS — I10 Essential (primary) hypertension: Secondary | ICD-10-CM

## 2013-05-18 DIAGNOSIS — C61 Malignant neoplasm of prostate: Secondary | ICD-10-CM

## 2013-05-18 LAB — COMPREHENSIVE METABOLIC PANEL
BUN: 36 mg/dL — ABNORMAL HIGH (ref 6–23)
CO2: 29 mEq/L (ref 19–32)
Calcium: 9 mg/dL (ref 8.4–10.5)
Chloride: 101 mEq/L (ref 96–112)
Creatinine, Ser: 1.2 mg/dL (ref 0.4–1.5)
GFR: 63.34 mL/min (ref >60.00)
Glucose, Bld: 96 mg/dL (ref 70–99)

## 2013-05-18 LAB — CBC WITH DIFFERENTIAL/PLATELET
Basophils Absolute: 0 10*3/uL (ref 0.0–0.1)
Eosinophils Relative: 0.9 % (ref 0.0–5.0)
MCV: 106.8 fl — ABNORMAL HIGH (ref 78.0–100.0)
Monocytes Absolute: 0.6 10*3/uL (ref 0.1–1.0)
Neutrophils Relative %: 58.3 % (ref 43.0–77.0)
Platelets: 132 10*3/uL — ABNORMAL LOW (ref 150.0–400.0)
RDW: 15.5 % — ABNORMAL HIGH (ref 11.5–14.6)
WBC: 6.7 10*3/uL (ref 4.5–10.5)

## 2013-05-18 LAB — LIPID PANEL
HDL: 40.4 mg/dL (ref >39.00)
Total CHOL/HDL Ratio: 3
VLDL: 22.6 mg/dL (ref 0.0–40.0)

## 2013-05-18 LAB — URINALYSIS, ROUTINE W REFLEX MICROSCOPIC
Leukocytes, UA: NEGATIVE
Specific Gravity, Urine: >=1.03 (ref 1.000–1.030)
Urobilinogen, UA: 0.2 (ref 0.0–1.0)

## 2013-05-18 NOTE — Progress Notes (Signed)
Patient ID: Victor Green, male   DOB: Apr 10, 1920, 79 y.o.   MRN: [id number]  History of Present Illness:   Edema:he has had pedal edema for several months requiring diuretics. Etiology unclear as he has had no systemic disease is unlikely to be from venous insufficiency. He is not using elastic stockings as he has difficulty putting them on now. On his last visit was told to take Lasix 40 mg daily. Previously also has required Zaroxolyn and larger doses of diuretics. Dosage limited by prerenal azotemia and relatively low blood pressure  History of hypertension: Previously also had hyperaldosteronism but has not had any hypokalemia. The pressure not requiring any medications at this time Anemia: Hemoglobin was worse at 10.9 on the last visit, but clear if this was from concomitant fluid overload and is not on any specific treatment now.  Hypothyroidism: This is mild and stable on low-dose supplementation History of prostate cancer, PSA has been upper normal compared to before which was high. He has been seeing the urologist and a repeat PSA has been requested  Hypercholesterolemia: Mild and well-controlled with low dose Lipitor     Medication List       This list is accurate as of: 05/18/13 11:59 PM.  Always use your most recent med list.               albuterol 108 (90 BASE) MCG/ACT inhaler  Commonly known as:  PROVENTIL HFA;VENTOLIN HFA  Inhale 2 puffs into the lungs every 4 (four) hours as needed for wheezing.     ALPRAZolam 0.25 MG tablet  Commonly known as:  XANAX  Take 0.25 mg by mouth daily.     aMILoride 5 MG tablet  Commonly known as:  MIDAMOR  Take 5 mg by mouth daily.     amLODipine 2.5 MG tablet  Commonly known as:  NORVASC  Take 2.5 mg by mouth daily.     aspirin 81 MG tablet  Take 81 mg by mouth daily.     ASTELIN NA  Place 2 sprays into the nose 2 (two) times daily.     atorvastatin 10 MG tablet  Commonly known as:  LIPITOR  Take 10 mg by mouth daily.  Take 1/2 tablet once a day     brimonidine 0.15 % ophthalmic solution  Commonly known as:  ALPHAGAN  Place 1 drop into both eyes 2 (two) times daily.     diazepam 2 MG tablet  Commonly known as:  VALIUM  Take 2 mg by mouth daily.     dorzolamide 2 % ophthalmic solution  Commonly known as:  TRUSOPT  1 drop as directed.     doxazosin 4 MG tablet  Commonly known as:  CARDURA  Take 4 mg in AM and 2 mg in PM     EPINEPHrine 0.3 mg/0.3 mL Soaj  Commonly known as:  EPI-PEN  Inject 0.3 mg into the muscle once. INJECT AS DIRECTED INTRAMUSCULAR PER DR KUMARE     fexofenadine 180 MG tablet  Commonly known as:  ALLEGRA  Take 180 mg by mouth daily. 1/2 tablet once a day     fluticasone 50 MCG/ACT nasal spray  Commonly known as:  FLONASE  Place 2 sprays into the nose 3 (three) times daily.     furosemide 40 MG tablet  Commonly known as:  LASIX  Take 40 mg by mouth. TAKE 1/2 TABLET ORALLY ONCE A DAY     glucosamine-chondroitin 500-400 MG tablet  Take 1 tablet  by mouth 3 (three) times daily.     levothyroxine 25 MCG tablet  Commonly known as:  SYNTHROID, LEVOTHROID  Take 50 mcg by mouth daily before breakfast.     METAMUCIL 30.9 % Powd  Generic drug:  Psyllium  Take by mouth 3 (three) times daily with meals.     metolazone 2.5 MG tablet  Commonly known as:  ZAROXOLYN  Take 2.5 mg by mouth daily. TAKE ONE TABLET ORALLY ON Monday, Wednesday AND Friday 15-20 MINUTES BEFORE LASIX.     MIRALAX powder  Generic drug:  polyethylene glycol powder  Take 17 g by mouth as needed.     multivitamin capsule  Take 1 capsule by mouth daily.     pantoprazole 40 MG tablet  Commonly known as:  PROTONIX  Take 40 mg by mouth daily.     valsartan 320 MG tablet  Commonly known as:  DIOVAN  Take 320 mg by mouth daily. 1/2 tablet once a day        Allergies:  Allergies  Allergen Reactions  . Tetanus Toxoids     Past Medical History  Diagnosis Date  . Hypertension   . CAD (coronary  artery disease) complete w/u in 2009    extensive coronary calcification by EBCT, normal myoview, echo with EF 70% mild AI  . Hyperlipidemia   . Aortic insufficiency     echo July 2011 EF 60%  . 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 History of GI bleed 2003    due to pyloric-positive ulcer   . Diverticulosis     with remote history of diverticulitis  . Plantar fasciitis     chronic  . Allergic rhinitis     responding well to desensitation injections  . Glaucoma   . Osteoarthritis     both knees    Past Surgical History  Procedure Laterality Date  . Tonsillectomy    . Hemorrhoid surgery  1996  . Hernia repair  1996    right  . Lumbar laminectomy  2003  . Cataract extraction      left eye    Family History  Problem Relation Age of Onset  . Thrombosis Father     mesenteric artery thrombosis after prostate surgery  . Colon cancer Mother     Social History:  reports that he has never smoked. He does not have any smokeless tobacco history on file. He reports that he does not drink alcohol or use illicit drugs.  Review of Systems -   Has had osteoarthritis Has history of reflux and taking PPI drugs since he has been on anti-inflammatory drugs and asking about switching the Nexium OTC No history of symptomatic coronary artery disease History of diabetes No history of numbness in his feet Has history of shoulder rotator cuff symptoms   EXAM:  BP 115/60  Pulse 62  Ht 5\' 9"  (1.753 m)  Wt 185 lb 12.8 oz (84.278 kg)  BMI 27.43 kg/m2  SpO2 96%  Blood pressure checked sitting and standing, see vitals  Trace edema on the left and 1+ on the right lower leg He has symptoms scaliness And dryness of the right lower leg skin  Assessment/Plan:   Anemia:: His anemia was relatively worse on his last visit and will need to recheck this along with iron saturation. Not a candidate for colonoscopy   Decreased libido and low normal testosterone: He wants to be  rechecked. He says his urologist wants  this and his PSA checked for his prostate cancer but serum not saved for this. Discussed that testosterone supplementation may not be advisable with his history of prostate cancer  Hypertension: His blood pressure is  not requiring any specific medication while he is using his diuretics  EDEMA: This is partly idiopathic and may be related to venous insufficiency also. No history of CHF Since his edema is only mild  EDEMA: He is not using his elastic stockings as he has difficulty putting them on  and he has a tendency to prerenal azotemia will not increase his Lasix unless edemas gets worse  Addendum: Hemoglobin improved, renal function normal

## 2013-05-21 NOTE — Patient Instructions (Addendum)
Instructions to because and after labs available, continue same medications for now Start using plastic stockings for edema

## 2013-05-22 NOTE — Progress Notes (Signed)
Labs mailed to patient.

## 2013-05-24 ENCOUNTER — Other Ambulatory Visit: Payer: Self-pay | Admitting: *Deleted

## 2013-05-24 MED ORDER — EPINEPHRINE 0.3 MG/0.3ML IJ SOAJ: 0.3000 mg | Freq: Once | INTRAMUSCULAR | Status: DC

## 2013-05-25 ENCOUNTER — Ambulatory Visit: Payer: 59 | Admitting: Endocrinology

## 2013-06-21 ENCOUNTER — Telehealth: Payer: Self-pay | Admitting: Endocrinology

## 2013-06-21 ENCOUNTER — Other Ambulatory Visit: Payer: Self-pay | Admitting: *Deleted

## 2013-06-21 DIAGNOSIS — R609 Edema, unspecified: Secondary | ICD-10-CM

## 2013-06-21 MED ORDER — FUROSEMIDE 40 MG PO TABS: 40.0000 mg | ORAL_TABLET | Freq: Every day | ORAL | Status: DC

## 2013-06-21 NOTE — Telephone Encounter (Signed)
VOID

## 2013-06-30 ENCOUNTER — Telehealth: Payer: Self-pay | Admitting: Endocrinology

## 2013-06-30 NOTE — Telephone Encounter (Signed)
Pt is aware.  

## 2013-06-30 NOTE — Telephone Encounter (Signed)
Pt says he has been having a raspy voice, had bronchitis, he's not having any fever or coughing anything up, he's not feeling very bad from it, he says it's more annoying.  Wants to know if you can recommend something either rx or otc?

## 2013-06-30 NOTE — Telephone Encounter (Signed)
No specific treatment as it is likely viral, he can use OTC throat lozenges and steam inhalations

## 2013-07-19 ENCOUNTER — Encounter: Payer: Self-pay | Admitting: Endocrinology

## 2013-07-19 ENCOUNTER — Ambulatory Visit (INDEPENDENT_AMBULATORY_CARE_PROVIDER_SITE_OTHER): Payer: 59 | Admitting: Endocrinology

## 2013-07-19 VITALS — BP 104/58 | HR 74 | Temp 98.4°F | Resp 12 | Ht 69.0 in | Wt 184.6 lb

## 2013-07-19 DIAGNOSIS — N400 Enlarged prostate without lower urinary tract symptoms: Secondary | ICD-10-CM | POA: Insufficient documentation

## 2013-07-19 DIAGNOSIS — R7989 Other specified abnormal findings of blood chemistry: Secondary | ICD-10-CM

## 2013-07-19 DIAGNOSIS — C61 Malignant neoplasm of prostate: Secondary | ICD-10-CM

## 2013-07-19 DIAGNOSIS — D509 Iron deficiency anemia, unspecified: Secondary | ICD-10-CM

## 2013-07-19 DIAGNOSIS — E291 Testicular hypofunction: Secondary | ICD-10-CM

## 2013-07-19 DIAGNOSIS — R609 Edema, unspecified: Secondary | ICD-10-CM

## 2013-07-19 DIAGNOSIS — E7800 Pure hypercholesterolemia, unspecified: Secondary | ICD-10-CM | POA: Insufficient documentation

## 2013-07-19 DIAGNOSIS — E039 Hypothyroidism, unspecified: Secondary | ICD-10-CM

## 2013-07-19 LAB — CBC WITH DIFFERENTIAL/PLATELET
Basophils Relative: 0.2 % (ref 0.0–3.0)
Eosinophils Relative: 0.8 % (ref 0.0–5.0)
HCT: 32.8 % — ABNORMAL LOW (ref 39.0–52.0)
Hemoglobin: 11.1 g/dL — ABNORMAL LOW (ref 13.0–17.0)
Lymphs Abs: 1.2 10*3/uL (ref 0.7–4.0)
MCV: 102.5 fl — ABNORMAL HIGH (ref 78.0–100.0)
Monocytes Absolute: 0.4 10*3/uL (ref 0.1–1.0)
Neutro Abs: 3.6 10*3/uL (ref 1.4–7.7)
RBC: 3.2 Mil/uL — ABNORMAL LOW (ref 4.22–5.81)
WBC: 5.3 10*3/uL (ref 4.5–10.5)

## 2013-07-19 LAB — COMPREHENSIVE METABOLIC PANEL
Albumin: 3.6 g/dL (ref 3.5–5.2)
Alkaline Phosphatase: 58 U/L (ref 39–117)
BUN: 32 mg/dL — ABNORMAL HIGH (ref 6–23)
Calcium: 8.6 mg/dL (ref 8.4–10.5)
Creatinine, Ser: 1 mg/dL (ref 0.4–1.5)
Glucose, Bld: 110 mg/dL — ABNORMAL HIGH (ref 70–99)
Potassium: 4 mEq/L (ref 3.5–5.1)

## 2013-07-19 LAB — URINALYSIS, ROUTINE W REFLEX MICROSCOPIC
Hgb urine dipstick: NEGATIVE
Ketones, ur: NEGATIVE
Leukocytes, UA: NEGATIVE
Urine Glucose: NEGATIVE
Urobilinogen, UA: 0.2 (ref 0.0–1.0)

## 2013-07-19 LAB — LIPID PANEL
LDL Cholesterol: 53 mg/dL (ref 0–99)
Total CHOL/HDL Ratio: 3
Triglycerides: 84 mg/dL (ref 0.0–149.0)

## 2013-07-19 LAB — T4, FREE: Free T4: 1.01 ng/dL (ref 0.60–1.60)

## 2013-07-19 LAB — TESTOSTERONE: Testosterone: 163.77 ng/dL — ABNORMAL LOW (ref 350.00–890.00)

## 2013-07-19 LAB — TSH: TSH: 3.11 u[IU]/mL (ref 0.35–5.50)

## 2013-07-19 NOTE — Patient Instructions (Addendum)
LASIX ONE ALTERNATING WITH 1/2 TAB  Make sure you are off AMLODIPINE  USE ELASTIC STOCKING DAILY

## 2013-07-19 NOTE — Progress Notes (Signed)
Patient ID: Victor Green, male   DOB: [birth year], 79 y.o.   MRN: [id number]  History of Present Illness:   Edema:he has had pedal edema for several months requiring diuretics. Etiology unclear as he has had no systemic disease, low albumin levels or renal insufficiency. Has been felt to be  from venous insufficiency.  He is not using elastic stockings as he has difficulty putting them on now. Previously also has required Zaroxolyn and larger doses of diuretics.  Dosage limited by prerenal azotemia and relatively low blood pressure  Wt Readings from Last 3 Encounters:  07/19/13 184 lb 9.6 oz (83.734 kg)  05/18/13 185 lb 12.8 oz (84.278 kg)  08/27/11 182 lb 8 oz (82.781 kg)    History of hypertension: Previously also had hyperaldosteronism but has not had any hypokalemia. Currently not requiring any medications at this time. Home blood pressures generally higher than in the office HOME BP recently 134-150/66-87  Anemia: Hemoglobin was improved on last visit  History of prostate cancer, PSA has been upper normal compared to before which was high. He has been seeing the urologist and a repeat PSA has been requested  Hypercholesterolemia: Mild and well-controlled with low dose Lipitor     Medication List       This list is accurate as of: 07/19/13  1:26 PM.  Always use your most recent med list.               albuterol 108 (90 BASE) MCG/ACT inhaler  Commonly known as:  PROVENTIL HFA;VENTOLIN HFA  Inhale 2 puffs into the lungs every 4 (four) hours as needed for wheezing.     aMILoride 5 MG tablet  Commonly known as:  MIDAMOR  Take 5 mg by mouth daily.     amLODipine 2.5 MG tablet  Commonly known as:  NORVASC  Take 2.5 mg by mouth daily.     aspirin 81 MG tablet  Take 81 mg by mouth daily.     ASTELIN NA  Place 2 sprays into the nose 2 (two) times daily.     atorvastatin 10 MG tablet  Commonly known as:  LIPITOR  Take 10 mg by mouth daily. Take 1/2 tablet once a day      brimonidine 0.15 % ophthalmic solution  Commonly known as:  ALPHAGAN  Place 1 drop into both eyes 2 (two) times daily.     diazepam 2 MG tablet  Commonly known as:  VALIUM  Take 2 mg by mouth daily.     dorzolamide 2 % ophthalmic solution  Commonly known as:  TRUSOPT  1 drop as directed.     EPINEPHrine 0.3 mg/0.3 mL Soaj injection  Commonly known as:  EPI-PEN  Inject 0.3 mLs (0.3 mg total) into the muscle once. INJECT AS DIRECTED INTRAMUSCULAR PER DR KUMARE     fexofenadine 180 MG tablet  Commonly known as:  ALLEGRA  Take 180 mg by mouth daily. 1/2 tablet once a day     fluticasone 50 MCG/ACT nasal spray  Commonly known as:  FLONASE  Place 2 sprays into the nose 3 (three) times daily.     furosemide 40 MG tablet  Commonly known as:  LASIX  Take 1 tablet (40 mg total) by mouth daily. TAKE 1 TABLET ORALLY ONCE A DAY     glucosamine-chondroitin 500-400 MG tablet  Take 1 tablet by mouth 3 (three) times daily.     levothyroxine 25 MCG tablet  Commonly known as:  SYNTHROID, LEVOTHROID  Take 50 mcg by mouth daily before breakfast.     METAMUCIL 30.9 % Powd  Generic drug:  Psyllium  Take by mouth 3 (three) times daily with meals.     MIRALAX powder  Generic drug:  polyethylene glycol powder  Take 17 g by mouth as needed.     multivitamin capsule  Take 1 capsule by mouth daily.     pantoprazole 40 MG tablet  Commonly known as:  PROTONIX  Take 40 mg by mouth daily.        Allergies:  Allergies  Allergen Reactions  . Tetanus Toxoids     Past Medical History  Diagnosis Date  . Hypertension   . CAD (coronary artery disease) complete w/u in 2009    extensive coronary calcification by EBCT, normal myoview, echo with EF 70% mild AI  . Hyperlipidemia   . Aortic insufficiency     echo July 2011 EF 60%  . 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 History of GI bleed 2003    due to pyloric-positive ulcer   . Diverticulosis     with remote history of  diverticulitis  . Plantar fasciitis     chronic  . Allergic rhinitis     responding well to desensitation injections  . Glaucoma   . Osteoarthritis     both knees    Past Surgical History  Procedure Laterality Date  . Tonsillectomy    . Hemorrhoid surgery  1996  . Hernia repair  1996    right  . Lumbar laminectomy  2003  . Cataract extraction      left eye    Family History  Problem Relation Age of Onset  . Thrombosis Father     mesenteric artery thrombosis after prostate surgery  . Colon cancer Mother     Social History:  reports that he has never smoked. He does not have any smokeless tobacco history on file. He reports that he does not drink alcohol or use illicit drugs.  Review of Systems -   Has had osteoarthritis Has history of reflux and taking PPI drugs since he has been on anti-inflammatory drugs and asking about switching the Nexium OTC No history of symptomatic coronary artery disease No history of diabetes Has history of shoulder rotator cuff symptoms   EXAM:  BP 142/78  Pulse 74  Temp(Src) 98.4 F (36.9 C)  Resp 12  Ht 5\' 9"  (1.753 m)  Wt 184 lb 9.6 oz (83.734 kg)  BMI 27.25 kg/m2  SpO2 96%  Blood pressure checked sitting and standing, see vitals  No edema on the left and trace on the right lower leg above his socks  Assessment/Plan:   EDEMA: This is probably related to venous insufficiency. No history of CHF and cardiac evaluation negative Since his edema is only mild  and he appears to have mild orthostasis will reduce his Lasix for now. Also has a tendency to prerenal azotemia with larger amounts of diuretics  Anemia:: His anemia has been stable   Decreased libido and low normal testosterone: He wants to be rechecked. He says his urologist wants this and his PSA checked for his prostate cancer  Discussed that testosterone supplementation may not be advisable with his history of prostate cancer  Hypertension: He is not requiring any  specific medication while he is using his diuretics despite previous history of significant hypertension  Addendum: Renal function stable, has low testosterone and high normal PSA

## 2013-07-26 ENCOUNTER — Ambulatory Visit: Payer: 59 | Admitting: Endocrinology

## 2013-08-09 ENCOUNTER — Other Ambulatory Visit: Payer: Self-pay | Admitting: *Deleted

## 2013-08-09 DIAGNOSIS — R609 Edema, unspecified: Secondary | ICD-10-CM

## 2013-08-09 MED ORDER — DIAZEPAM 2 MG PO TABS: 2.0000 mg | ORAL_TABLET | Freq: Every day | ORAL | Status: DC

## 2013-08-09 MED ORDER — FUROSEMIDE 40 MG PO TABS: 40.0000 mg | ORAL_TABLET | Freq: Every day | ORAL | Status: DC

## 2013-08-17 ENCOUNTER — Encounter: Payer: Self-pay | Admitting: Endocrinology

## 2013-09-20 ENCOUNTER — Encounter: Payer: Self-pay | Admitting: Endocrinology

## 2013-09-20 ENCOUNTER — Ambulatory Visit (INDEPENDENT_AMBULATORY_CARE_PROVIDER_SITE_OTHER): Payer: 59 | Admitting: Endocrinology

## 2013-09-20 VITALS — BP 128/60 | HR 67 | Temp 98.5°F | Resp 12 | Ht 69.0 in | Wt 183.1 lb

## 2013-09-20 DIAGNOSIS — E7800 Pure hypercholesterolemia, unspecified: Secondary | ICD-10-CM

## 2013-09-20 DIAGNOSIS — D509 Iron deficiency anemia, unspecified: Secondary | ICD-10-CM

## 2013-09-20 DIAGNOSIS — E039 Hypothyroidism, unspecified: Secondary | ICD-10-CM

## 2013-09-20 DIAGNOSIS — D696 Thrombocytopenia, unspecified: Secondary | ICD-10-CM

## 2013-09-20 DIAGNOSIS — R609 Edema, unspecified: Secondary | ICD-10-CM

## 2013-09-20 LAB — LIPID PANEL
Cholesterol: 100 mg/dL (ref 0–200)
LDL Cholesterol: 44 mg/dL (ref 0–99)
Total CHOL/HDL Ratio: 3
VLDL: 18.2 mg/dL (ref 0.0–40.0)

## 2013-09-20 LAB — CBC WITH DIFFERENTIAL/PLATELET
Basophils Absolute: 0 10*3/uL (ref 0.0–0.1)
Basophils Relative: 0.3 % (ref 0.0–3.0)
Eosinophils Absolute: 0 10*3/uL (ref 0.0–0.7)
Hemoglobin: 10.8 g/dL — ABNORMAL LOW (ref 13.0–17.0)
Lymphocytes Relative: 25 % (ref 12.0–46.0)
MCHC: 33.7 g/dL (ref 30.0–36.0)
Monocytes Relative: 7.2 % (ref 3.0–12.0)
Neutrophils Relative %: 67.2 % (ref 43.0–77.0)
RBC: 3.11 Mil/uL — ABNORMAL LOW (ref 4.22–5.81)
RDW: 16.1 % — ABNORMAL HIGH (ref 11.5–14.6)

## 2013-09-20 LAB — COMPREHENSIVE METABOLIC PANEL
ALT: 13 U/L (ref 0–53)
AST: 22 U/L (ref 0–37)
Albumin: 3.6 g/dL (ref 3.5–5.2)
Alkaline Phosphatase: 57 U/L (ref 39–117)
Glucose, Bld: 92 mg/dL (ref 70–99)
Potassium: 4.4 mEq/L (ref 3.5–5.1)
Sodium: 138 mEq/L (ref 135–145)
Total Bilirubin: 0.9 mg/dL (ref 0.3–1.2)
Total Protein: 6.4 g/dL (ref 6.0–8.3)

## 2013-09-20 NOTE — Progress Notes (Signed)
Patient ID: Victor Green, male   DOB: 06-28-1920, 79 y.o.   MRN: [id number]  Chief complaint: Followup of multiple problems  History of Present Illness:  1. Edema:he has had pedal edema for several months requiring diuretics. Etiology unclear as he has had no systemic disease, low albumin levels or renal insufficiency. It has been felt to be  from venous insufficiency.  He is not using elastic stockings consistently and is asking about using them today He is now using Lasix 40 mg daily. Previously also has required Zaroxolyn and larger doses of diuretics. On his last visit he was told to alternate 40 mg which 20 mg of Lasix but has not done so His edema is well controlled currently Dosage limited by prerenal azotemia and relatively low blood pressure  Wt Readings from Last 3 Encounters:  09/20/13 183 lb 1.6 oz (83.054 kg)  07/19/13 184 lb 9.6 oz (83.734 kg)  05/18/13 185 lb 12.8 oz (84.278 kg)    2. History of hypertension: Previously also had hyperaldosteronism but has not had any hypokalemia. Currently not requiring any antihypertensive medications. Home blood pressures have been generally higher than in the office HOME BP recently 114-160/ 69-84, mostly under 135  3. Anemia: Hemoglobin has ranged from 11.1-11.6 in the last few months. Ferritin low normal at 38  4. History of prostate cancer, PSA has been minimally elevated compared to before when it was high. Last PSA was 4.3  He has been seeing the urologist  infrequently  5. Hypercholesterolemia: Mild and well-controlled with low dose Lipitor. Last LDL 44     Medication List       This list is accurate as of: 09/20/13  1:30 PM.  Always use your most recent med list.               albuterol 108 (90 BASE) MCG/ACT inhaler  Commonly known as:  PROVENTIL HFA;VENTOLIN HFA  Inhale 2 puffs into the lungs every 4 (four) hours as needed for wheezing.     aMILoride 5 MG tablet  Commonly known as:  MIDAMOR  Take 5 mg by mouth  daily.     amLODipine 2.5 MG tablet  Commonly known as:  NORVASC  Take 2.5 mg by mouth daily.     aspirin 81 MG tablet  Take 81 mg by mouth daily.     ASTELIN NA  Place 2 sprays into the nose 2 (two) times daily.     atorvastatin 10 MG tablet  Commonly known as:  LIPITOR  Take 10 mg by mouth daily. Take 1/2 tablet once a day     brimonidine 0.15 % ophthalmic solution  Commonly known as:  ALPHAGAN  Place 1 drop into both eyes 2 (two) times daily.     diazepam 2 MG tablet  Commonly known as:  VALIUM  Take 1 tablet (2 mg total) by mouth daily.     dorzolamide 2 % ophthalmic solution  Commonly known as:  TRUSOPT  1 drop as directed.     EPINEPHrine 0.3 mg/0.3 mL Soaj injection  Commonly known as:  EPI-PEN  Inject 0.3 mLs (0.3 mg total) into the muscle once. INJECT AS DIRECTED INTRAMUSCULAR PER DR KUMARE     fexofenadine 180 MG tablet  Commonly known as:  ALLEGRA  Take 180 mg by mouth daily. 1/2 tablet once a day     fluticasone 50 MCG/ACT nasal spray  Commonly known as:  FLONASE  Place 2 sprays into the nose 3 (three) times  daily.     furosemide 40 MG tablet  Commonly known as:  LASIX  Take 1 tablet (40 mg total) by mouth daily. TAKE 1 TABLET ORALLY ONCE A DAY     glucosamine-chondroitin 500-400 MG tablet  Take 1 tablet by mouth 3 (three) times daily.     levothyroxine 25 MCG tablet  Commonly known as:  SYNTHROID, LEVOTHROID  Take 50 mcg by mouth daily before breakfast.     METAMUCIL 30.9 % Powd  Generic drug:  Psyllium  Take by mouth 3 (three) times daily with meals.     MIRALAX powder  Generic drug:  polyethylene glycol powder  Take 17 g by mouth as needed.     multivitamin capsule  Take 1 capsule by mouth daily.     pantoprazole 40 MG tablet  Commonly known as:  PROTONIX  Take 40 mg by mouth daily.        Allergies:  Allergies  Allergen Reactions  . Tetanus Toxoids     Past Medical History  Diagnosis Date  . Hypertension   . CAD (coronary  artery disease) complete w/u in 2009    extensive coronary calcification by EBCT, normal myoview, echo with EF 70% mild AI  . Hyperlipidemia   . Aortic insufficiency     echo July 2011 EF 60%  . 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 History of GI bleed 2003    due to pyloric-positive ulcer   . Diverticulosis     with remote history of diverticulitis  . Plantar fasciitis     chronic  . Allergic rhinitis     responding well to desensitation injections  . Glaucoma   . Osteoarthritis     both knees    Past Surgical History  Procedure Laterality Date  . Tonsillectomy    . Hemorrhoid surgery  1996  . Hernia repair  1996    right  . Lumbar laminectomy  2003  . Cataract extraction      left eye    Family History  Problem Relation Age of Onset  . Thrombosis Father     mesenteric artery thrombosis after prostate surgery  . Colon cancer Mother     Social History:  reports that he has never smoked. He does not have any smokeless tobacco history on file. He reports that he does not drink alcohol or use illicit drugs.  Review of Systems -   Has had osteoarthritis in multiple joints especially knees Has history of reflux and taking PPI drugs since he has been on anti-inflammatory drugs and asking about switching the Nexium OTC No history of symptomatic coronary artery disease Has history of shoulder rotator cuff symptoms   EXAM:  BP 128/60  Pulse 67  Temp(Src) 98.5 F (36.9 C)  Resp 12  Ht 5\' 9"  (1.753 m)  Wt 183 lb 1.6 oz (83.054 kg)  BMI 27.03 kg/m2  SpO2 95%  Pleasant and alert, well built and nourished  No edema on the left lower leg and trace on the right lower leg above his sock line  Assessment/Plan:   EDEMA: This is  related to venous insufficiency. No history of CHF and cardiac evaluation negative More recently his edema is only mild. He did not reduce his Lasix as advised on the last visit when he was having some orthostasis when his blood  pressure measurement; will reduce his Lasix to 40 mg alternating with 20  Anemia with mild thrombocytopenia workup:  Likely to be from chronic disease and mild degree of iron deficiency; His anemia has been stable previously, to be followed periodically   Decreased libido and low normal testosterone: Probably does not have any significant systemic symptoms from this. Last testosterone level was 158  Discussed that testosterone supplementation may not be advisable with his history of prostate cancer  Hypertension: He is not requiring any specific medication except amiloride and will continue to monitor Discussed more lax systolic blood pressure target at his age  [facility]  Office Visit on 09/20/2013  Component Date Value Range Status  . Sodium 09/20/2013 138  135 - 145 mEq/L Final  . Potassium 09/20/2013 4.4  3.5 - 5.1 mEq/L Final  . Chloride 09/20/2013 106  96 - 112 mEq/L Final  . CO2 09/20/2013 28  19 - 32 mEq/L Final  . Glucose, Bld 09/20/2013 92  70 - 99 mg/dL Final  . BUN 16/08/9603 35* 6 - 23 mg/dL Final  . Creatinine, Ser 09/20/2013 1.0  0.4 - 1.5 mg/dL Final  . Total Bilirubin 09/20/2013 0.9  0.3 - 1.2 mg/dL Final  . Alkaline Phosphatase 09/20/2013 57  39 - 117 U/L Final  . AST 09/20/2013 22  0 - 37 U/L Final  . ALT 09/20/2013 13  0 - 53 U/L Final  . Total Protein 09/20/2013 6.4  6.0 - 8.3 g/dL Final  . Albumin 54/07/8118 3.6  3.5 - 5.2 g/dL Final  . Calcium 14/78/2956 9.1  8.4 - 10.5 mg/dL Final  . GFR 21/30/8657 71.87  >60.00 mL/min Final  . Cholesterol 09/20/2013 100  0 - 200 mg/dL Final   ATP III Classification       Desirable:  < 200 mg/dL               Borderline High:  200 - 239 mg/dL          High:  > = 846 mg/dL  . Triglycerides 09/20/2013 91.0  0.0 - 149.0 mg/dL Final   Normal:  <962 mg/dLBorderline High:  150 - 199 mg/dL  . HDL 09/20/2013 38.10* >39.00 mg/dL Final  . VLDL 95/28/4132 18.2  0.0 - 40.0 mg/dL Final  . LDL Cholesterol 09/20/2013 44  0 - 99 mg/dL  Final  . Total CHOL/HDL Ratio 09/20/2013 3   Final                  Men          Women1/2 Average Risk     3.4          3.3Average Risk          5.0          4.42X Average Risk          9.6          7.13X Average Risk          15.0          11.0                      . WBC 09/20/2013 6.4  4.5 - 10.5 K/uL Final  . RBC 09/20/2013 3.11* 4.22 - 5.81 Mil/uL Final  . Hemoglobin 09/20/2013 10.8* 13.0 - 17.0 g/dL Final  . HCT 44/11/270 32.1* 39.0 - 52.0 % Final  . MCV 09/20/2013 103.3* 78.0 - 100.0 fl Final  . MCHC 09/20/2013 33.7  30.0 - 36.0 g/dL Final  . RDW 53/66/4403 16.1* 11.5 - 14.6 % Final  . Platelets 09/20/2013  106.0* 150.0 - 400.0 K/uL Final  . Neutrophils Relative % 09/20/2013 67.2  43.0 - 77.0 % Final  . Lymphocytes Relative 09/20/2013 25.0  12.0 - 46.0 % Final  . Monocytes Relative 09/20/2013 7.2  3.0 - 12.0 % Final  . Eosinophils Relative 09/20/2013 0.3  0.0 - 5.0 % Final  . Basophils Relative 09/20/2013 0.3  0.0 - 3.0 % Final  . Neutro Abs 09/20/2013 4.3  1.4 - 7.7 K/uL Final  . Lymphs Abs 09/20/2013 1.6  0.7 - 4.0 K/uL Final  . Monocytes Absolute 09/20/2013 0.5  0.1 - 1.0 K/uL Final  . Eosinophils Absolute 09/20/2013 0.0  0.0 - 0.7 K/uL Final  . Basophils Absolute 09/20/2013 0.0  0.0 - 0.1 K/uL Final

## 2013-09-21 DIAGNOSIS — D696 Thrombocytopenia, unspecified: Secondary | ICD-10-CM | POA: Insufficient documentation

## 2013-10-30 ENCOUNTER — Other Ambulatory Visit: Payer: Self-pay | Admitting: *Deleted

## 2013-10-30 ENCOUNTER — Telehealth: Payer: Self-pay | Admitting: *Deleted

## 2013-10-30 MED ORDER — HYDROCOD POLST-CHLORPHEN POLST 10-8 MG/5ML PO LQCR: 5.0000 mL | Freq: Every evening | ORAL | Status: DC | PRN

## 2013-10-30 MED ORDER — ALBUTEROL SULFATE HFA 108 (90 BASE) MCG/ACT IN AERS: 2.0000 | INHALATION_SPRAY | RESPIRATORY_TRACT | Status: DC | PRN

## 2013-10-30 NOTE — Telephone Encounter (Signed)
Noted, pt is aware, rx's have been sent

## 2013-10-30 NOTE — Telephone Encounter (Signed)
I spoke with Victor Green on the phone, he sounds horrible, he states he is taking mucinex every day, he was running a fever yesterday, is very hoarse and feels very achy.  Please advise

## 2013-10-30 NOTE — Telephone Encounter (Signed)
Steam inhalation for hoarseness, Tussionex 1tsf hs for cough and Albuterol 2 puffs qid for chest congestion

## 2013-10-30 NOTE — Telephone Encounter (Signed)
Apparently he has a viral infection. Needs antibiotic only if having dark or yellow sputum He can use Mucinex DM for cough OTC

## 2013-10-30 NOTE — Telephone Encounter (Signed)
Pt would like an antibiotic, he says he's been sick with bronchitis and was running a temp of 99.4 as of yesterday, he said he has a sore raspy throat, cough, and hoarseness, Please advise

## 2013-10-30 NOTE — Telephone Encounter (Signed)
Noted left message on patients vm

## 2013-10-31 ENCOUNTER — Other Ambulatory Visit: Payer: Self-pay | Admitting: *Deleted

## 2013-10-31 ENCOUNTER — Telehealth: Payer: Self-pay | Admitting: *Deleted

## 2013-10-31 MED ORDER — AMOXICILLIN-POT CLAVULANATE 875-125 MG PO TABS: 1.0000 | ORAL_TABLET | Freq: Two times a day (BID) | ORAL | Status: DC

## 2013-10-31 MED ORDER — HYDROCOD POLST-CHLORPHEN POLST 10-8 MG/5ML PO LQCR: 5.0000 mL | Freq: Every evening | ORAL | Status: DC | PRN

## 2013-10-31 NOTE — Telephone Encounter (Signed)
Noted, rx sent, patient is aware. 

## 2013-10-31 NOTE — Telephone Encounter (Signed)
Patient called back again today, he's running a temp of about 99.7 and coughing up a green mucus discharge, He would like for an antibiotic to be called in to help with this. Please advise.

## 2013-10-31 NOTE — Telephone Encounter (Signed)
Augmentin 875/125, twice a day, 14 tablets

## 2013-11-06 ENCOUNTER — Other Ambulatory Visit: Payer: Self-pay | Admitting: *Deleted

## 2013-11-06 ENCOUNTER — Telehealth: Payer: Self-pay

## 2013-11-06 MED ORDER — AMILORIDE HCL 5 MG PO TABS: 5.0000 mg | ORAL_TABLET | Freq: Every day | ORAL | Status: DC

## 2013-11-06 NOTE — Telephone Encounter (Signed)
Kellie at [facility] requested a call back concerning pts pneumonia. Call back # is [phone]

## 2013-11-06 NOTE — Telephone Encounter (Signed)
I spoke with patient this morning and he is doing much better.

## 2013-11-16 ENCOUNTER — Telehealth: Payer: Self-pay | Admitting: *Deleted

## 2013-11-16 NOTE — Telephone Encounter (Signed)
Mr. Capraro has been getting allergy shots from a Dr. At [facility], but he said the Dr. Has left for [place], he is looking for a good Allergist here in town, wants to know if you have any names?

## 2013-11-16 NOTE — Telephone Encounter (Signed)
Noted, patient is aware. 

## 2013-11-16 NOTE — Telephone Encounter (Signed)
Dr Mosetta Anis

## 2013-12-08 ENCOUNTER — Other Ambulatory Visit: Payer: Self-pay | Admitting: Endocrinology

## 2013-12-08 ENCOUNTER — Other Ambulatory Visit: Payer: Self-pay | Admitting: *Deleted

## 2013-12-08 MED ORDER — DIAZEPAM 2 MG PO TABS: 2.0000 mg | ORAL_TABLET | Freq: Every day | ORAL | Status: DC

## 2013-12-12 ENCOUNTER — Other Ambulatory Visit: Payer: Self-pay | Admitting: *Deleted

## 2013-12-12 MED ORDER — LEVOTHYROXINE SODIUM 50 MCG PO TABS: 50.0000 ug | ORAL_TABLET | Freq: Every day | ORAL | Status: DC

## 2013-12-21 ENCOUNTER — Ambulatory Visit (INDEPENDENT_AMBULATORY_CARE_PROVIDER_SITE_OTHER): Payer: 59 | Admitting: Endocrinology

## 2013-12-21 ENCOUNTER — Encounter: Payer: Self-pay | Admitting: Endocrinology

## 2013-12-21 VITALS — BP 116/60 | HR 78 | Temp 98.4°F | Resp 16 | Ht 69.0 in | Wt 185.6 lb

## 2013-12-21 DIAGNOSIS — D539 Nutritional anemia, unspecified: Secondary | ICD-10-CM

## 2013-12-21 DIAGNOSIS — D696 Thrombocytopenia, unspecified: Secondary | ICD-10-CM

## 2013-12-21 DIAGNOSIS — E039 Hypothyroidism, unspecified: Secondary | ICD-10-CM

## 2013-12-21 DIAGNOSIS — R5383 Other fatigue: Secondary | ICD-10-CM

## 2013-12-21 DIAGNOSIS — D509 Iron deficiency anemia, unspecified: Secondary | ICD-10-CM

## 2013-12-21 DIAGNOSIS — R5381 Other malaise: Secondary | ICD-10-CM

## 2013-12-21 DIAGNOSIS — R609 Edema, unspecified: Secondary | ICD-10-CM

## 2013-12-21 LAB — COMPREHENSIVE METABOLIC PANEL
ALBUMIN: 3.8 g/dL (ref 3.5–5.2)
ALT: 14 U/L (ref 0–53)
AST: 24 U/L (ref 0–37)
Alkaline Phosphatase: 52 U/L (ref 39–117)
BUN: 40 mg/dL — AB (ref 6–23)
CALCIUM: 9.2 mg/dL (ref 8.4–10.5)
CHLORIDE: 106 meq/L (ref 96–112)
CO2: 25 meq/L (ref 19–32)
Creatinine, Ser: 1 mg/dL (ref 0.4–1.5)
GFR: 74.33 mL/min (ref >60.00)
GLUCOSE: 86 mg/dL (ref 70–99)
POTASSIUM: 4.2 meq/L (ref 3.5–5.1)
Sodium: 138 mEq/L (ref 135–145)
Total Bilirubin: 0.8 mg/dL (ref 0.3–1.2)
Total Protein: 6.4 g/dL (ref 6.0–8.3)

## 2013-12-21 LAB — IBC PANEL
Iron: 113 ug/dL (ref 42–165)
Saturation Ratios: 42.4 % (ref 20.0–50.0)
Transferrin: 190.4 mg/dL — ABNORMAL LOW (ref 212.0–360.0)

## 2013-12-21 LAB — CBC WITH DIFFERENTIAL/PLATELET
BASOS PCT: 0.1 % (ref 0.0–3.0)
Basophils Absolute: 0 10*3/uL (ref 0.0–0.1)
Eosinophils Absolute: 0 10*3/uL (ref 0.0–0.7)
Eosinophils Relative: 0.7 % (ref 0.0–5.0)
HCT: 34.8 % — ABNORMAL LOW (ref 39.0–52.0)
Hemoglobin: 11.3 g/dL — ABNORMAL LOW (ref 13.0–17.0)
LYMPHS PCT: 28.7 % (ref 12.0–46.0)
Lymphs Abs: 1.7 10*3/uL (ref 0.7–4.0)
MCHC: 32.5 g/dL (ref 30.0–36.0)
MCV: 104.9 fl — ABNORMAL HIGH (ref 78.0–100.0)
MONOS PCT: 7.4 % (ref 3.0–12.0)
Monocytes Absolute: 0.4 10*3/uL (ref 0.1–1.0)
NEUTROS PCT: 63.1 % (ref 43.0–77.0)
Neutro Abs: 3.8 10*3/uL (ref 1.4–7.7)
Platelets: 93 10*3/uL — ABNORMAL LOW (ref 150.0–400.0)
RBC: 3.32 Mil/uL — AB (ref 4.22–5.81)
RDW: 16.1 % — ABNORMAL HIGH (ref 11.5–14.6)
WBC: 6 10*3/uL (ref 4.5–10.5)

## 2013-12-21 LAB — VITAMIN B12: VITAMIN B 12: 1039 pg/mL — AB (ref 211–911)

## 2013-12-21 LAB — T4, FREE: FREE T4: 0.95 ng/dL (ref 0.60–1.60)

## 2013-12-21 LAB — TSH: TSH: 2.48 u[IU]/mL (ref 0.35–5.50)

## 2013-12-21 NOTE — Progress Notes (Signed)
Patient ID: Victor Green, male   DOB: 12-15-1919, 80 y.o.   MRN: [id number]  Chief complaint: Followup of multiple problems  History of Present Illness:  1. Leg edema: he has had lower leg edema chronically requiring diuretics. Etiology unclear as he has had no systemic disease, low albumin levels or renal insufficiency. It has been felt to be  from venous insufficiency.  He is not using elastic stockings consistently  He is now using Lasix 40 mg alternating with 20 daily. Previously also has required Zaroxolyn and larger doses of diuretics with occasional problems with renal insufficiency and low normal blood pressures. His edema is well controlled currently  Wt Readings from Last 3 Encounters:  12/21/13 185 lb 9.6 oz (84.188 kg)  09/20/13 183 lb 1.6 oz (83.054 kg)  07/19/13 184 lb 9.6 oz (83.734 kg)    2. History of hypertension: Previously also had hyperaldosteronism but has not had any hypokalemia. Currently not requiring any antihypertensive medications. Home blood pressures have been generally higher than in the office HOME BP recently about 017-510 systolic, mostly normal diastolic  3. Anemia: Hemoglobin has ranged from 10.8 -11.6 in the last few months. Ferritin previously low normal at 38 and B12 normal. He was referred to hematologist in 2012 but did not go  4. He is asking about taking testosterone level. Has had some decreased libido and occasional daytime fatigue and sleepiness. Previous level is 158 but treatment deferred because of questionable prostate cancer   5. Hypercholesterolemia: Mild and well-controlled with low dose Lipitor.   Lab Results  Component Value Date   CHOL 100 09/20/2013   HDL 38.10* 09/20/2013   LDLCALC 44 09/20/2013   TRIG 91.0 09/20/2013   CHOLHDL 3 09/20/2013       Medication List       This list is accurate as of: 12/21/13  2:04 PM.  Always use your most recent med list.               albuterol 108 (90 BASE) MCG/ACT inhaler   Commonly known as:  PROVENTIL HFA;VENTOLIN HFA  Inhale 2 puffs into the lungs every 4 (four) hours as needed for wheezing.     aMILoride 5 MG tablet  Commonly known as:  MIDAMOR  Take 1 tablet (5 mg total) by mouth daily.     amLODipine 2.5 MG tablet  Commonly known as:  NORVASC  Take 2.5 mg by mouth daily.     amoxicillin-clavulanate 875-125 MG per tablet  Commonly known as:  AUGMENTIN  Take 1 tablet by mouth 2 (two) times daily.     aspirin 81 MG tablet  Take 81 mg by mouth daily.     ASTELIN NA  Place 2 sprays into the nose 2 (two) times daily.     atorvastatin 10 MG tablet  Commonly known as:  LIPITOR  Take 10 mg by mouth daily. Take 1/2 tablet once a day     brimonidine 0.15 % ophthalmic solution  Commonly known as:  ALPHAGAN  Place 1 drop into both eyes 2 (two) times daily.     chlorpheniramine-HYDROcodone 10-8 MG/5ML Lqcr  Commonly known as:  TUSSIONEX PENNKINETIC ER  Take 5 mLs by mouth at bedtime as needed for cough.     diazepam 2 MG tablet  Commonly known as:  VALIUM  Take 1 tablet (2 mg total) by mouth daily.     dorzolamide 2 % ophthalmic solution  Commonly known as:  TRUSOPT  1 drop as directed.  EPINEPHrine 0.3 mg/0.3 mL Soaj injection  Commonly known as:  EPI-PEN  Inject 0.3 mLs (0.3 mg total) into the muscle once. INJECT AS DIRECTED INTRAMUSCULAR PER DR KUMARE     fexofenadine 180 MG tablet  Commonly known as:  ALLEGRA  Take 180 mg by mouth daily. 1/2 tablet once a day     fluticasone 50 MCG/ACT nasal spray  Commonly known as:  FLONASE  Place 2 sprays into the nose 3 (three) times daily.     furosemide 40 MG tablet  Commonly known as:  LASIX  Take 1 tablet by mouth  daily     glucosamine-chondroitin 500-400 MG tablet  Take 1 tablet by mouth 3 (three) times daily.     levothyroxine 50 MCG tablet  Commonly known as:  SYNTHROID, LEVOTHROID  Take 1 tablet (50 mcg total) by mouth daily.     METAMUCIL 30.9 % Powd  Generic drug:   Psyllium  Take by mouth 3 (three) times daily with meals.     MIRALAX powder  Generic drug:  polyethylene glycol powder  Take 17 g by mouth as needed.     multivitamin capsule  Take 1 capsule by mouth daily.     pantoprazole 40 MG tablet  Commonly known as:  PROTONIX  Take 40 mg by mouth daily.        Allergies:  Allergies  Allergen Reactions  . Tetanus Toxoids     Past Medical History  Diagnosis Date  . Hypertension   . CAD (coronary artery disease) complete w/u in 2009    extensive coronary calcification by EBCT, normal myoview, echo with EF 70% mild AI  . Hyperlipidemia   . Aortic insufficiency     echo July 2011 EF 60%  . 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 History of GI bleed 2003    due to pyloric-positive ulcer   . Diverticulosis     with remote history of diverticulitis  . Plantar fasciitis     chronic  . Allergic rhinitis     responding well to desensitation injections  . Glaucoma   . Osteoarthritis     both knees    Past Surgical History  Procedure Laterality Date  . Tonsillectomy    . Hemorrhoid surgery  1996  . Hernia repair  1996    right  . Lumbar laminectomy  2003  . Cataract extraction      left eye    Family History  Problem Relation Age of Onset  . Thrombosis Father     mesenteric artery thrombosis after prostate surgery  . Colon cancer Mother     Social History:  reports that he has never smoked. He does not have any smokeless tobacco history on file. He reports that he does not drink alcohol or use illicit drugs.  Review of Systems -   Has had osteoarthritis in multiple joints especially knees, asking about followup with the orthopedic surgeon  Has history of reflux and taking PPI drugs since he has been on anti-inflammatory drugs and asking about switching the Nexium OTC No history of symptomatic coronary artery disease   EXAM:  BP 132/78  Pulse 78  Temp(Src) 98.4 F (36.9 C)  Resp 16  Ht 5\' 9"   (1.753 m)  Wt 185 lb 9.6 oz (84.188 kg)  BMI 27.40 kg/m2  SpO2 94%  Standing blood pressure 116/60 right arm and 102/54 left arm  Pleasant and alert, well built and nourished  1+ left lower leg edema, minimal on the right  Assessment/Plan:   EDEMA: This is  related to venous insufficiency. No history of CHF and cardiac evaluation negative  Now  his edema is only mild even with slight reduction in Lasix.  However his blood pressure is low normal standing. If his BUN or creatinine or higher will reduce Lasix to 20 mg  Anemia with mild thrombocytopenia: Likely to be from chronic disease and mild degree of iron deficiency; His hemoglobin was slightly lower than the last visit and the iron and B12 levels needs to be reassessed    Decreased libido and low normal testosterone:  not clear if he is havingsystemic symptoms from this. Last testosterone level was 158  Will check free testosterone because of his age   Hypertension: He is not requiring any specific medication except amiloride and will continue to monitor    [facility]  No visits with results within 1 Week(s) from this visit. Latest known visit with results is:  Office Visit on 09/20/2013  Component Date Value Ref Range Status  . Sodium 09/20/2013 138  135 - 145 mEq/L Final  . Potassium 09/20/2013 4.4  3.5 - 5.1 mEq/L Final  . Chloride 09/20/2013 106  96 - 112 mEq/L Final  . CO2 09/20/2013 28  19 - 32 mEq/L Final  . Glucose, Bld 09/20/2013 92  70 - 99 mg/dL Final  . BUN 09/20/2013 35* 6 - 23 mg/dL Final  . Creatinine, Ser 09/20/2013 1.0  0.4 - 1.5 mg/dL Final  . Total Bilirubin 09/20/2013 0.9  0.3 - 1.2 mg/dL Final  . Alkaline Phosphatase 09/20/2013 57  39 - 117 U/L Final  . AST 09/20/2013 22  0 - 37 U/L Final  . ALT 09/20/2013 13  0 - 53 U/L Final  . Total Protein 09/20/2013 6.4  6.0 - 8.3 g/dL Final  . Albumin 09/20/2013 3.6  3.5 - 5.2 g/dL Final  . Calcium 09/20/2013 9.1  8.4 - 10.5 mg/dL Final  . GFR 09/20/2013  71.87  >60.00 mL/min Final  . Cholesterol 09/20/2013 100  0 - 200 mg/dL Final   ATP III Classification       Desirable:  < 200 mg/dL               Borderline High:  200 - 239 mg/dL          High:  > = 240 mg/dL  . Triglycerides 09/20/2013 91.0  0.0 - 149.0 mg/dL Final   Normal:  <150 mg/dLBorderline High:  150 - 199 mg/dL  . HDL 09/20/2013 38.10* >39.00 mg/dL Final  . VLDL 09/20/2013 18.2  0.0 - 40.0 mg/dL Final  . LDL Cholesterol 09/20/2013 44  0 - 99 mg/dL Final  . Total CHOL/HDL Ratio 09/20/2013 3   Final                  Men          Women1/2 Average Risk     3.4          3.3Average Risk          5.0          4.42X Average Risk          9.6          7.13X Average Risk          15.0          11.0                      .  WBC 09/20/2013 6.4  4.5 - 10.5 K/uL Final  . RBC 09/20/2013 3.11* 4.22 - 5.81 Mil/uL Final  . Hemoglobin 09/20/2013 10.8* 13.0 - 17.0 g/dL Final  . HCT 09/20/2013 32.1* 39.0 - 52.0 % Final  . MCV 09/20/2013 103.3* 78.0 - 100.0 fl Final  . MCHC 09/20/2013 33.7  30.0 - 36.0 g/dL Final  . RDW 09/20/2013 16.1* 11.5 - 14.6 % Final  . Platelets 09/20/2013 106.0* 150.0 - 400.0 K/uL Final  . Neutrophils Relative % 09/20/2013 67.2  43.0 - 77.0 % Final  . Lymphocytes Relative 09/20/2013 25.0  12.0 - 46.0 % Final  . Monocytes Relative 09/20/2013 7.2  3.0 - 12.0 % Final  . Eosinophils Relative 09/20/2013 0.3  0.0 - 5.0 % Final  . Basophils Relative 09/20/2013 0.3  0.0 - 3.0 % Final  . Neutro Abs 09/20/2013 4.3  1.4 - 7.7 K/uL Final  . Lymphs Abs 09/20/2013 1.6  0.7 - 4.0 K/uL Final  . Monocytes Absolute 09/20/2013 0.5  0.1 - 1.0 K/uL Final  . Eosinophils Absolute 09/20/2013 0.0  0.0 - 0.7 K/uL Final  . Basophils Absolute 09/20/2013 0.0  0.0 - 0.1 K/uL Final

## 2013-12-22 NOTE — Progress Notes (Signed)
Quick Note:  Please let patient know that the anemia is better, iron and B12 are normal; since kidney test likely higher would like to reduce furosemide to 20 mg daily, testosterone level pending ______

## 2013-12-24 LAB — TESTOSTERONE, FREE, TOTAL, SHBG
TESTOSTERONE, TOTAL: 190.7 ng/dL — AB (ref 348.0–1197.0)
Testosterone, Free: 1.4 pg/mL — ABNORMAL LOW (ref 6.6–18.1)

## 2013-12-29 ENCOUNTER — Other Ambulatory Visit: Payer: Self-pay | Admitting: *Deleted

## 2013-12-29 MED ORDER — EPINEPHRINE 0.3 MG/0.3ML IJ SOAJ: 0.3000 mg | Freq: Once | INTRAMUSCULAR | Status: DC

## 2014-02-22 ENCOUNTER — Other Ambulatory Visit: Payer: Self-pay | Admitting: *Deleted

## 2014-02-22 ENCOUNTER — Telehealth: Payer: Self-pay | Admitting: Endocrinology

## 2014-02-22 MED ORDER — PANTOPRAZOLE SODIUM 40 MG PO TBEC: 40.0000 mg | DELAYED_RELEASE_TABLET | Freq: Every day | ORAL | Status: DC

## 2014-02-22 NOTE — Telephone Encounter (Signed)
rx sent

## 2014-02-22 NOTE — Telephone Encounter (Signed)
Please call in rx for protonix to [facility]

## 2014-02-26 ENCOUNTER — Other Ambulatory Visit: Payer: Self-pay | Admitting: *Deleted

## 2014-02-26 MED ORDER — PANTOPRAZOLE SODIUM 40 MG PO TBEC: 40.0000 mg | DELAYED_RELEASE_TABLET | Freq: Every day | ORAL | Status: DC

## 2014-03-22 ENCOUNTER — Ambulatory Visit: Payer: 59 | Admitting: Endocrinology

## 2014-03-23 ENCOUNTER — Other Ambulatory Visit: Payer: Self-pay | Admitting: *Deleted

## 2014-03-23 MED ORDER — AZELASTINE HCL 0.1 % NA SOLN: 1.0000 | Freq: Two times a day (BID) | NASAL | Status: DC

## 2014-04-02 ENCOUNTER — Encounter: Payer: Self-pay | Admitting: Endocrinology

## 2014-04-02 ENCOUNTER — Ambulatory Visit (INDEPENDENT_AMBULATORY_CARE_PROVIDER_SITE_OTHER): Payer: 59 | Admitting: Endocrinology

## 2014-04-02 VITALS — BP 126/68 | HR 73 | Temp 98.3°F | Resp 14 | Ht 69.0 in | Wt 180.6 lb

## 2014-04-02 DIAGNOSIS — D509 Iron deficiency anemia, unspecified: Secondary | ICD-10-CM

## 2014-04-02 DIAGNOSIS — E039 Hypothyroidism, unspecified: Secondary | ICD-10-CM

## 2014-04-02 DIAGNOSIS — R609 Edema, unspecified: Secondary | ICD-10-CM

## 2014-04-02 DIAGNOSIS — E7800 Pure hypercholesterolemia, unspecified: Secondary | ICD-10-CM

## 2014-04-02 NOTE — Progress Notes (Signed)
Patient ID: Victor Green, male   DOB: Aug 07, 1920, 80 y.o.   MRN: [id number]  Chief complaint: Followup of multiple problems  History of Present Illness:  1. Leg edema: he has had lower leg edema which has been persistent and requiring variable doses of diuretics.  Etiology unclear as he has had no systemic disease, low albumin levels or renal insufficiency.  It has been felt to be  from venous insufficiency.  He is recently using elastic stockings but is using it mostly on his left leg and this is below the knee, prefers the one with the zipper  He is now using Lasix 40 mg since 5/28 since he thought he had more edema. However does not think the swelling is better. Not able to tell me if he has swelling on the right side Previously also has required Zaroxolyn and larger doses of diuretics with occasional problems with renal insufficiency and low normal blood pressures.  Wt Readings from Last 3 Encounters:  04/02/14 180 lb 9.6 oz (81.92 kg)  12/21/13 185 lb 9.6 oz (84.188 kg)  09/20/13 183 lb 1.6 oz (83.054 kg)    2. History of hypertension: Previously also had hyperaldosteronism but has not had any hypokalemia. Currently not requiring any antihypertensive medications. Home blood pressures have been generally higher than in the office HOME BP recently about 885-027 systolic, mostly normal diastolic  3. Anemia: Hemoglobin has ranged from 10.8 -11.6 in the last few months. His last iron level was normal. Ferritin previously low normal at 38 and B12 normal. Asymptomatic except for some sleepiness  4.. Hypercholesterolemia: Mild and well-controlled with low dose Lipitor.   Lab Results  Component Value Date   CHOL 100 09/20/2013   HDL 38.10* 09/20/2013   LDLCALC 44 09/20/2013   TRIG 91.0 09/20/2013   CHOLHDL 3 09/20/2013       Medication List       This list is accurate as of: 04/02/14  2:26 PM.  Always use your most recent med list.               albuterol 108 (90 BASE)  MCG/ACT inhaler  Commonly known as:  PROVENTIL HFA;VENTOLIN HFA  Inhale 2 puffs into the lungs every 4 (four) hours as needed for wheezing.     aMILoride 5 MG tablet  Commonly known as:  MIDAMOR  Take 1 tablet (5 mg total) by mouth daily.     amLODipine 2.5 MG tablet  Commonly known as:  NORVASC  Take 2.5 mg by mouth daily.     amoxicillin-clavulanate 875-125 MG per tablet  Commonly known as:  AUGMENTIN  Take 1 tablet by mouth 2 (two) times daily.     aspirin 81 MG tablet  Take 81 mg by mouth daily.     atorvastatin 10 MG tablet  Commonly known as:  LIPITOR  Take 10 mg by mouth daily. Take 1/2 tablet once a day     azelastine 0.1 % nasal spray  Commonly known as:  ASTELIN  Place 1 spray into both nostrils 2 (two) times daily. Use in each nostril as directed     brimonidine 0.15 % ophthalmic solution  Commonly known as:  ALPHAGAN  Place 1 drop into both eyes 2 (two) times daily.     chlorpheniramine-HYDROcodone 10-8 MG/5ML Lqcr  Commonly known as:  TUSSIONEX PENNKINETIC ER  Take 5 mLs by mouth at bedtime as needed for cough.     diazepam 2 MG tablet  Commonly known as:  VALIUM  Take 1 tablet (2 mg total) by mouth daily.     dorzolamide 2 % ophthalmic solution  Commonly known as:  TRUSOPT  1 drop as directed.     EPINEPHrine 0.3 mg/0.3 mL Soaj injection  Commonly known as:  EPI-PEN  Inject 0.3 mLs (0.3 mg total) into the muscle once. INJECT AS DIRECTED INTRAMUSCULAR PER DR Dajaun Goldring     fexofenadine 180 MG tablet  Commonly known as:  ALLEGRA  Take 180 mg by mouth daily. 1/2 tablet once a day     fluticasone 50 MCG/ACT nasal spray  Commonly known as:  FLONASE  Place 2 sprays into the nose 3 (three) times daily.     furosemide 40 MG tablet  Commonly known as:  LASIX  Take 1 tablet by mouth  daily     glucosamine-chondroitin 500-400 MG tablet  Take 1 tablet by mouth 3 (three) times daily.     levothyroxine 50 MCG tablet  Commonly known as:  SYNTHROID, LEVOTHROID   Take 1 tablet (50 mcg total) by mouth daily.     METAMUCIL 30.9 % Powd  Generic drug:  Psyllium  Take by mouth 3 (three) times daily with meals.     MIRALAX powder  Generic drug:  polyethylene glycol powder  Take 17 g by mouth as needed.     multivitamin capsule  Take 1 capsule by mouth daily.     pantoprazole 40 MG tablet  Commonly known as:  PROTONIX  Take 1 tablet (40 mg total) by mouth daily.        Allergies:  Allergies  Allergen Reactions  . Tetanus Toxoids     Past Medical History  Diagnosis Date  . Hypertension   . CAD (coronary artery disease) complete w/u in 2009    extensive coronary calcification by EBCT, normal myoview, echo with EF 70% mild AI  . Hyperlipidemia   . Aortic insufficiency     echo July 2011 EF 60%  . 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 History of GI bleed 2003    due to pyloric-positive ulcer   . Diverticulosis     with remote history of diverticulitis  . Plantar fasciitis     chronic  . Allergic rhinitis     responding well to desensitation injections  . Glaucoma   . Osteoarthritis     both knees    Past Surgical History  Procedure Laterality Date  . Tonsillectomy    . Hemorrhoid surgery  1996  . Hernia repair  1996    right  . Lumbar laminectomy  2003  . Cataract extraction      left eye    Family History  Problem Relation Age of Onset  . Thrombosis Father     mesenteric artery thrombosis after prostate surgery  . Colon cancer Mother     Social History:  reports that he has never smoked. He does not have any smokeless tobacco history on file. He reports that he does not drink alcohol or use illicit drugs.  Review of Systems -   Has had osteoarthritis in multiple joints especially knees, asking about followup with the orthopedic surgeon  Has history of reflux and taking PPI drugs since he has been on anti-inflammatory drugs and asking about switching the Nexium OTC No history of symptomatic  coronary artery disease  Lab Results  Component Value Date   CREATININE 1.0 12/21/2013      EXAM:  BP  126/68  Pulse 73  Temp(Src) 98.3 F (36.8 C)  Resp 14  Ht 5\' 9"  (1.753 m)  Wt 180 lb 9.6 oz (81.92 kg)  BMI 26.66 kg/m2  SpO2 94%  Pleasant and alert, well built and nourished  2+ left lower leg edema, minimal on the right above his sock  Assessment/Plan:   EDEMA: This is  related to venous insufficiency. No history of CHF and cardiac evaluation negative  Now  his edema is only apparent on the left side and not helped by his elastic stocking which is below the knee Advised him to use a plastic stocking above the knee, prescription given. Also go back to 20 mg Lasix  Anemia with mild thrombocytopenia: Likely to be from chronic disease and mild degree of iron deficiency; His hemoglobin was slightly low previously, to repeat today   Fatigue/sleepiness: This is not excessive but will recheck his TSH or mild hypothyroidism  Hypertension: He is not requiring any specific medication except amiloride and will continue to monitor    [name]

## 2014-04-02 NOTE — Patient Instructions (Signed)
Lasix 20 mg daily  Use thigh high stockings

## 2014-04-30 ENCOUNTER — Other Ambulatory Visit: Payer: Self-pay | Admitting: *Deleted

## 2014-04-30 ENCOUNTER — Telehealth: Payer: Self-pay | Admitting: Endocrinology

## 2014-04-30 MED ORDER — AZELASTINE HCL 0.1 % NA SOLN: 1.0000 | Freq: Two times a day (BID) | NASAL | Status: DC

## 2014-04-30 MED ORDER — FLUTICASONE PROPIONATE 50 MCG/ACT NA SUSP
2.0000 | Freq: Three times a day (TID) | NASAL | Status: DC
Start: 2014-04-30 — End: 2014-08-16

## 2014-04-30 NOTE — Telephone Encounter (Signed)
Victor Green needs to know what the status is for the refills on fluticasone and azelastine

## 2014-05-12 ENCOUNTER — Other Ambulatory Visit: Payer: Self-pay | Admitting: Endocrinology

## 2014-06-07 ENCOUNTER — Other Ambulatory Visit: Payer: Self-pay | Admitting: *Deleted

## 2014-06-07 ENCOUNTER — Telehealth: Payer: Self-pay | Admitting: Endocrinology

## 2014-06-07 MED ORDER — AMILORIDE HCL 5 MG PO TABS: 5.0000 mg | ORAL_TABLET | Freq: Every day | ORAL | Status: DC

## 2014-06-07 NOTE — Telephone Encounter (Signed)
Patient need refill of Amiloride 5 mg sent to [facility] on [address]

## 2014-06-27 ENCOUNTER — Other Ambulatory Visit: Payer: Self-pay | Admitting: *Deleted

## 2014-06-27 MED ORDER — PANTOPRAZOLE SODIUM 40 MG PO TBEC: 40.0000 mg | DELAYED_RELEASE_TABLET | Freq: Every day | ORAL | Status: DC

## 2014-06-27 NOTE — Telephone Encounter (Signed)
Patient need refill on Protonix 40 mg just a few pills to tide him over until he get his regular meds,sent to [facility] on [address]

## 2014-07-04 ENCOUNTER — Other Ambulatory Visit (INDEPENDENT_AMBULATORY_CARE_PROVIDER_SITE_OTHER): Payer: 59

## 2014-07-04 ENCOUNTER — Ambulatory Visit (INDEPENDENT_AMBULATORY_CARE_PROVIDER_SITE_OTHER): Payer: 59 | Admitting: Endocrinology

## 2014-07-04 ENCOUNTER — Encounter: Payer: Self-pay | Admitting: Endocrinology

## 2014-07-04 VITALS — BP 140/70 | HR 70 | Temp 98.6°F | Resp 14 | Ht 69.0 in | Wt 182.6 lb

## 2014-07-04 DIAGNOSIS — E039 Hypothyroidism, unspecified: Secondary | ICD-10-CM

## 2014-07-04 DIAGNOSIS — E291 Testicular hypofunction: Secondary | ICD-10-CM

## 2014-07-04 DIAGNOSIS — D509 Iron deficiency anemia, unspecified: Secondary | ICD-10-CM

## 2014-07-04 DIAGNOSIS — R609 Edema, unspecified: Secondary | ICD-10-CM

## 2014-07-04 DIAGNOSIS — D696 Thrombocytopenia, unspecified: Secondary | ICD-10-CM

## 2014-07-04 DIAGNOSIS — R7989 Other specified abnormal findings of blood chemistry: Secondary | ICD-10-CM

## 2014-07-04 DIAGNOSIS — E7800 Pure hypercholesterolemia, unspecified: Secondary | ICD-10-CM

## 2014-07-04 LAB — LIPID PANEL
Cholesterol: 98 mg/dL (ref 0–200)
HDL: 34.8 mg/dL — ABNORMAL LOW (ref >39.00)
LDL Cholesterol: 44 mg/dL (ref 0–99)
NonHDL: 63.2
TRIGLYCERIDES: 95 mg/dL (ref 0.0–149.0)
Total CHOL/HDL Ratio: 3
VLDL: 19 mg/dL (ref 0.0–40.0)

## 2014-07-04 LAB — CBC
HCT: 32.3 % — ABNORMAL LOW (ref 39.0–52.0)
HEMOGLOBIN: 10.7 g/dL — AB (ref 13.0–17.0)
MCHC: 33.3 g/dL (ref 30.0–36.0)
MCV: 100.6 fl — ABNORMAL HIGH (ref 78.0–100.0)
Platelets: 84 10*3/uL — ABNORMAL LOW (ref 150.0–400.0)
RBC: 3.21 Mil/uL — ABNORMAL LOW (ref 4.22–5.81)
RDW: 17.1 % — ABNORMAL HIGH (ref 11.5–15.5)
WBC: 6.1 10*3/uL (ref 4.0–10.5)

## 2014-07-04 LAB — COMPREHENSIVE METABOLIC PANEL
ALBUMIN: 3.5 g/dL (ref 3.5–5.2)
ALT: 20 U/L (ref 0–53)
AST: 27 U/L (ref 0–37)
Alkaline Phosphatase: 55 U/L (ref 39–117)
BUN: 36 mg/dL — ABNORMAL HIGH (ref 6–23)
CALCIUM: 8.9 mg/dL (ref 8.4–10.5)
CO2: 28 mEq/L (ref 19–32)
CREATININE: 1 mg/dL (ref 0.4–1.5)
Chloride: 108 mEq/L (ref 96–112)
GFR: 71.75 mL/min (ref >60.00)
GLUCOSE: 98 mg/dL (ref 70–99)
POTASSIUM: 4.5 meq/L (ref 3.5–5.1)
Sodium: 140 mEq/L (ref 135–145)
Total Bilirubin: 0.6 mg/dL (ref 0.2–1.2)
Total Protein: 6.3 g/dL (ref 6.0–8.3)

## 2014-07-04 LAB — TSH: TSH: 0.54 u[IU]/mL (ref 0.35–4.50)

## 2014-07-04 LAB — T4, FREE: Free T4: 1.12 ng/dL (ref 0.60–1.60)

## 2014-07-04 NOTE — Patient Instructions (Signed)
Use high moisture skin cream, elastic stocking on left leg in daytime

## 2014-07-04 NOTE — Progress Notes (Addendum)
Patient ID: Victor Green, male   DOB: 04-Sep-1920, 80 y.o.   MRN: [id number]  Chief complaint: Followup of multiple problems  History of Present Illness:  1. Leg edema: he has had lower leg edema which has been persistent for a couple of years  Etiology unclear as he has had no systemic disease, low albumin levels or renal insufficiency.  It has been felt to be  from venous insufficiency.  Previously had used larger doses of diuretics including Zaroxolyn but currently only taking 40 mg, half tablet daily along with his amiloride He is not consistently using elastic stockings, prefers the one with the zipper which is knee-high  However does not think the swelling is better on the left but appears better on the right Does not appear to have overall fluid retention with stable weights  Wt Readings from Last 3 Encounters:  07/04/14 182 lb 9.6 oz (82.827 kg)  04/02/14 180 lb 9.6 oz (81.92 kg)  12/21/13 185 lb 9.6 oz (84.188 kg)    2. History of hypertension: Previously also had hyperaldosteronism but has not had any hypokalemia. Currently not requiring any antihypertensive medications except 5 mg amiloride. Home blood pressures have been generally higher than in the office HOME BLOOD PRESSURE recently about 130-145, checking times a week  3. Anemia: Hemoglobin has ranged from 10.8 -11.6 in the last few months. His last iron level was normal. Ferritin previously low normal at 38 and B12 normal but he is still taking iron. Asymptomatic with no unusual fatigue  Lab Results  Component Value Date   WBC 6.0 12/21/2013   HGB 11.3* 12/21/2013   HCT 34.8* 12/21/2013   MCV 104.9* 12/21/2013   PLT 93.0* 12/21/2013    4.. Hypercholesterolemia: Mild and well-controlled with low dose Lipitor, tends to have low HDL.   Lab Results  Component Value Date   CHOL 100 09/20/2013   HDL 38.10* 09/20/2013   LDLCALC 44 09/20/2013   TRIG 91.0 09/20/2013   CHOLHDL 3 09/20/2013   5. Skin rash: He is having  some redness and scaling on the left lower leg, mostly on the foot area. Not itching but he is using OTC anti-itching cream     Medication List       This list is accurate as of: 07/04/14  1:48 PM.  Always use your most recent med list.               albuterol 108 (90 BASE) MCG/ACT inhaler  Commonly known as:  PROVENTIL HFA;VENTOLIN HFA  Inhale 2 puffs into the lungs every 4 (four) hours as needed for wheezing.     aMILoride 5 MG tablet  Commonly known as:  MIDAMOR  Take 1 tablet (5 mg total) by mouth daily.     amLODipine 2.5 MG tablet  Commonly known as:  NORVASC  Take 2.5 mg by mouth daily.     aspirin 81 MG tablet  Take 81 mg by mouth daily.     atorvastatin 10 MG tablet  Commonly known as:  LIPITOR  Take 10 mg by mouth daily. Take 1/2 tablet once a day     azelastine 0.1 % nasal spray  Commonly known as:  ASTELIN  Place 1 spray into both nostrils 2 (two) times daily. Use in each nostril as directed     brimonidine 0.15 % ophthalmic solution  Commonly known as:  ALPHAGAN  Place 1 drop into both eyes 2 (two) times daily.     chlorpheniramine-HYDROcodone 10-8 MG/5ML  Lqcr  Commonly known as:  TUSSIONEX PENNKINETIC ER  Take 5 mLs by mouth at bedtime as needed for cough.     diazepam 2 MG tablet  Commonly known as:  VALIUM  Take 1 tablet (2 mg total) by mouth daily.     dorzolamide 2 % ophthalmic solution  Commonly known as:  TRUSOPT  1 drop as directed.     EPINEPHrine 0.3 mg/0.3 mL Soaj injection  Commonly known as:  EPI-PEN  Inject 0.3 mLs (0.3 mg total) into the muscle once. INJECT AS DIRECTED INTRAMUSCULAR PER DR Devone Bonilla     fexofenadine 180 MG tablet  Commonly known as:  ALLEGRA  Take 180 mg by mouth daily. 1/2 tablet once a day     fluticasone 50 MCG/ACT nasal spray  Commonly known as:  FLONASE  Place 2 sprays into both nostrils 3 (three) times daily.     furosemide 40 MG tablet  Commonly known as:  LASIX  Take 1 tablet by mouth  daily      glucosamine-chondroitin 500-400 MG tablet  Take 1 tablet by mouth 3 (three) times daily.     levothyroxine 50 MCG tablet  Commonly known as:  SYNTHROID, LEVOTHROID  Take 1 tablet (50 mcg total) by mouth daily.     METAMUCIL 30.9 % Powd  Generic drug:  Psyllium  Take by mouth 3 (three) times daily with meals.     MIRALAX powder  Generic drug:  polyethylene glycol powder  Take 17 g by mouth as needed.     multivitamin capsule  Take 1 capsule by mouth daily.     pantoprazole 40 MG tablet  Commonly known as:  PROTONIX  Take 1 tablet (40 mg total) by mouth daily.        Allergies:  Allergies  Allergen Reactions  . Tetanus Toxoids     Past Medical History  Diagnosis Date  . Hypertension   . CAD (coronary artery disease) complete w/u in 2009    extensive coronary calcification by EBCT, normal myoview, echo with EF 70% mild AI  . Hyperlipidemia   . Aortic insufficiency     echo July 2011 EF 60%  . 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 History of GI bleed 2003    due to pyloric-positive ulcer   . Diverticulosis     with remote history of diverticulitis  . Plantar fasciitis     chronic  . Allergic rhinitis     responding well to desensitation injections  . Glaucoma   . Osteoarthritis     both knees    Past Surgical History  Procedure Laterality Date  . Tonsillectomy    . Hemorrhoid surgery  1996  . Hernia repair  1996    right  . Lumbar laminectomy  2003  . Cataract extraction      left eye    Family History  Problem Relation Age of Onset  . Thrombosis Father     mesenteric artery thrombosis after prostate surgery  . Colon cancer Mother     Social History:  reports that he has never smoked. He does not have any smokeless tobacco history on file. He reports that he does not drink alcohol or use illicit drugs.  Review of Systems -   Has had osteoarthritis in multiple joints especially knees   Has history of reflux and taking PPI drugs    No history of symptomatic coronary artery disease  History of increased  PSA, probable prostate cancer, his last PSA had been just above normal, has not followed up with urologist  Lab Results  Component Value Date   PSA 4.32* 07/19/2013   Asking about gynecomastia. This has been present for some time and also associated with relatively low testosterone level  Lab Results  Component Value Date   TESTOSTERONE 163.77* 07/19/2013    EXAM:  BP 140/70  Pulse 70  Temp(Src) 98.6 F (37 C)  Resp 14  Ht 5\' 9"  (1.753 m)  Wt 182 lb 9.6 oz (82.827 kg)  BMI 26.95 kg/m2  SpO2 97%  Pleasant and alert, well built and nourished  Heart sounds normal although distant Lungs clear  2+ left pedal, minimal on lower leg and no edema on the right side at the ankles Skin over the left foot is mildly erythematous and slightly scaly and slightly warm.  Assessment/Plan:   EDEMA: This is  related to venous insufficiency, mostly on the left side He has not used the elastic stockings consistently and advised him to do so today during the day  He can continue low-dose Lasix 20 mg along with 5 mg amiloride  Rash on lower leg: This is probably related to stasis and may improve with using elastic stockings. Advised him to use a better moisturizer cream, consider dermatology consultation if worse   Anemia with mild thrombocytopenia: Likely to be from chronic disease and mild degree of iron deficiency; His hemoglobin was slightly low previously, to repeat today    Gynecomastia: Likely to be from hypogonadism. Would not use testosterone at his age especially with possible history of prostate cancer  Hypertension: He is not requiring any specific medication except amiloride and will continue to monitor   Hanah Moultry  Addendum: Hemoglobin 10.7 with low platelets, may be from myelofibrosis, needs hematology consultation  TSH relatively low, to stop levothyroxine and repeat levels on the next  visit  Appointment on 07/04/2014  Component Date Value Ref Range Status  . Sodium 07/04/2014 140  135 - 145 mEq/L Final  . Potassium 07/04/2014 4.5  3.5 - 5.1 mEq/L Final  . Chloride 07/04/2014 108  96 - 112 mEq/L Final  . CO2 07/04/2014 28  19 - 32 mEq/L Final  . Glucose, Bld 07/04/2014 98  70 - 99 mg/dL Final  . BUN 07/04/2014 36* 6 - 23 mg/dL Final  . Creatinine, Ser 07/04/2014 1.0  0.4 - 1.5 mg/dL Final  . Total Bilirubin 07/04/2014 0.6  0.2 - 1.2 mg/dL Final  . Alkaline Phosphatase 07/04/2014 55  39 - 117 U/L Final  . AST 07/04/2014 27  0 - 37 U/L Final  . ALT 07/04/2014 20  0 - 53 U/L Final  . Total Protein 07/04/2014 6.3  6.0 - 8.3 g/dL Final  . Albumin 07/04/2014 3.5  3.5 - 5.2 g/dL Final  . Calcium 07/04/2014 8.9  8.4 - 10.5 mg/dL Final  . GFR 07/04/2014 71.75  >60.00 mL/min Final  . Cholesterol 07/04/2014 98  0 - 200 mg/dL Final   ATP III Classification       Desirable:  < 200 mg/dL               Borderline High:  200 - 239 mg/dL          High:  > = 240 mg/dL  . Triglycerides 07/04/2014 95.0  0.0 - 149.0 mg/dL Final   Normal:  <150 mg/dLBorderline High:  150 - 199 mg/dL  . HDL 07/04/2014 34.80* >39.00 mg/dL Final  .  VLDL 07/04/2014 19.0  0.0 - 40.0 mg/dL Final  . LDL Cholesterol 07/04/2014 44  0 - 99 mg/dL Final  . Total CHOL/HDL Ratio 07/04/2014 3   Final                  Men          Women1/2 Average Risk     3.4          3.3Average Risk          5.0          4.42X Average Risk          9.6          7.13X Average Risk          15.0          11.0                      . NonHDL 07/04/2014 63.20   Final   NOTE:  Non-HDL goal should be 30 mg/dL higher than patient's LDL goal (i.e. LDL goal of < 70 mg/dL, would have non-HDL goal of < 100 mg/dL)  . Free T4 07/04/2014 1.12  0.60 - 1.60 ng/dL Final  . TSH 07/04/2014 0.54  0.35 - 4.50 uIU/mL Final  . WBC 07/04/2014 6.1  4.0 - 10.5 K/uL Final  . RBC 07/04/2014 3.21* 4.22 - 5.81 Mil/uL Final  . Platelets 07/04/2014 84.0 Repeated and  verified X2.* 150.0 - 400.0 K/uL Final  . Hemoglobin 07/04/2014 10.7* 13.0 - 17.0 g/dL Final  . HCT 07/04/2014 32.3* 39.0 - 52.0 % Final  . MCV 07/04/2014 100.6* 78.0 - 100.0 fl Final  . MCHC 07/04/2014 33.3  30.0 - 36.0 g/dL Final  . RDW 07/04/2014 17.1* 11.5 - 15.5 % Final

## 2014-07-05 ENCOUNTER — Other Ambulatory Visit: Payer: Self-pay | Admitting: *Deleted

## 2014-07-05 DIAGNOSIS — D539 Nutritional anemia, unspecified: Secondary | ICD-10-CM

## 2014-07-06 NOTE — Addendum Note (Signed)
Addended by: [name] on: 07/06/2014 08:06 AM   Modules accepted: Orders

## 2014-07-10 ENCOUNTER — Telehealth: Payer: Self-pay | Admitting: Internal Medicine

## 2014-07-10 NOTE — Telephone Encounter (Signed)
C/D 07/10/14 for appt. 07/17/14

## 2014-07-17 ENCOUNTER — Other Ambulatory Visit (HOSPITAL_BASED_OUTPATIENT_CLINIC_OR_DEPARTMENT_OTHER): Payer: 59

## 2014-07-17 ENCOUNTER — Ambulatory Visit (HOSPITAL_BASED_OUTPATIENT_CLINIC_OR_DEPARTMENT_OTHER): Payer: 59 | Admitting: Internal Medicine

## 2014-07-17 ENCOUNTER — Encounter: Payer: Self-pay | Admitting: Internal Medicine

## 2014-07-17 ENCOUNTER — Other Ambulatory Visit: Payer: Self-pay | Admitting: Internal Medicine

## 2014-07-17 ENCOUNTER — Ambulatory Visit: Payer: 59

## 2014-07-17 VITALS — BP 124/57 | HR 68 | Temp 97.8°F | Resp 18 | Ht 69.0 in | Wt 182.8 lb

## 2014-07-17 DIAGNOSIS — D696 Thrombocytopenia, unspecified: Secondary | ICD-10-CM

## 2014-07-17 DIAGNOSIS — D509 Iron deficiency anemia, unspecified: Secondary | ICD-10-CM

## 2014-07-17 LAB — COMPREHENSIVE METABOLIC PANEL (CC13)
ALK PHOS: 61 U/L (ref 40–150)
ALT: 21 U/L (ref 0–55)
AST: 25 U/L (ref 5–34)
Albumin: 3.4 g/dL — ABNORMAL LOW (ref 3.5–5.0)
Anion Gap: 8 mEq/L (ref 3–11)
BILIRUBIN TOTAL: 0.65 mg/dL (ref 0.20–1.20)
BUN: 49.2 mg/dL — AB (ref 7.0–26.0)
CALCIUM: 8.7 mg/dL (ref 8.4–10.4)
CO2: 23 mEq/L (ref 22–29)
CREATININE: 1 mg/dL (ref 0.7–1.3)
Chloride: 111 mEq/L — ABNORMAL HIGH (ref 98–109)
Glucose: 88 mg/dl (ref 70–140)
Potassium: 4.6 mEq/L (ref 3.5–5.1)
Sodium: 141 mEq/L (ref 136–145)
Total Protein: 6.2 g/dL — ABNORMAL LOW (ref 6.4–8.3)

## 2014-07-17 LAB — CBC & DIFF AND RETIC
BASO%: 0 % (ref 0.0–2.0)
Basophils Absolute: 0 10*3/uL (ref 0.0–0.1)
EOS%: 1.2 % (ref 0.0–7.0)
Eosinophils Absolute: 0.1 10*3/uL (ref 0.0–0.5)
HEMATOCRIT: 32.9 % — AB (ref 38.4–49.9)
HGB: 10.5 g/dL — ABNORMAL LOW (ref 13.0–17.1)
IMMATURE RETIC FRACT: 12.3 % — AB (ref 3.00–10.60)
LYMPH%: 31.1 % (ref 14.0–49.0)
MCH: 32.6 pg (ref 27.2–33.4)
MCHC: 31.9 g/dL — ABNORMAL LOW (ref 32.0–36.0)
MCV: 102.2 fL — ABNORMAL HIGH (ref 79.3–98.0)
MONO#: 0.5 10*3/uL (ref 0.1–0.9)
MONO%: 7.5 % (ref 0.0–14.0)
NEUT#: 4.2 10*3/uL (ref 1.5–6.5)
NEUT%: 60.2 % (ref 39.0–75.0)
PLATELETS: 86 10*3/uL — AB (ref 140–400)
RBC: 3.22 10*6/uL — ABNORMAL LOW (ref 4.20–5.82)
RDW: 16.3 % — ABNORMAL HIGH (ref 11.0–14.6)
Retic %: 2.8 % — ABNORMAL HIGH (ref 0.80–1.80)
Retic Ct Abs: 90.16 10*3/uL (ref 34.80–93.90)
WBC: 6.9 10*3/uL (ref 4.0–10.3)
lymph#: 2.2 10*3/uL (ref 0.9–3.3)

## 2014-07-17 LAB — IRON AND TIBC CHCC
%SAT: 47 % (ref 20–55)
IRON: 108 ug/dL (ref 42–163)
TIBC: 229 ug/dL (ref 202–409)
UIBC: 120 ug/dL (ref 117–376)

## 2014-07-17 LAB — LACTATE DEHYDROGENASE (CC13): LDH: 207 U/L (ref 125–245)

## 2014-07-17 LAB — FERRITIN CHCC: Ferritin: 269 ng/ml (ref 22–316)

## 2014-07-17 NOTE — Progress Notes (Signed)
Checked in new patient with no financial issues prior to seeing the dr prior to seeing the dr. He has appt card. He ok'd his caregiver to electronic sign for him

## 2014-07-17 NOTE — Progress Notes (Signed)
[place] [phone]   [phone]  CONSULT NOTE  REFERRING PHYSICIAN: Dr. [name]  REASON FOR CONSULTATION:  80 years old white male with persistent anemia.  HPI Victor Green is a 80 y.o. male was past medical history significant for multiple medical problems including hypertension, coronary artery disease, dyslipidemia, aortic insufficiency, history of GI bleed, diverticulosis, plantar fasciitis, allergic rhinitis, osteoarthritis, hypothyroidism and long history of anemia. The patient was referred back to me for evaluation of his persistent anemia. I saw him back in September of 2011. I ordered several studies at that time for evaluation of his anemia but they were unremarkable. I recommended for him a bone marrow biopsy and aspirate at that time but the patient declined. He has been doing fine with no specific complaints except for mild fatigue and shortness of breath with exertion.  Over the last 5 years, his hemoglobin has been in the range of 10.5-12.0 G./DL. The patient denied having any dizzy spells. He has no significant chest pain, cough or hemoptysis. He has no bleeding issues. He is not on any iron supplements. I don't see any recent record of GI workup.  Since his last visit with me in 4 years ago, the patient lost his wife last year. He came to the clinic today accompanied by his assistant, Victor Green who helps with transportation.  FAMILY HISTORY:  Mother had colon cancer.  Father died from mesenteric artery thrombosis.  SOCIAL HISTORY:  He is a widow. His wife died last year. He has 2 sons.  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  The patient denied having any history of smoking, alcohol or drug abuse. HPI  Past Medical History  Diagnosis Date  . Hypertension   . CAD (coronary artery disease) complete w/u in 2009    extensive coronary calcification by EBCT, normal myoview, echo with EF 70% mild  AI  . Hyperlipidemia   . Aortic insufficiency     echo July 2011 EF 60%  . 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 History of GI bleed 2003    due to pyloric-positive ulcer   . Diverticulosis     with remote history of diverticulitis  . Plantar fasciitis     chronic  . Allergic rhinitis     responding well to desensitation injections  . Glaucoma   . Osteoarthritis     both knees  . Asthma     Past Surgical History  Procedure Laterality Date  . Tonsillectomy    . Hemorrhoid surgery  1996  . Hernia repair  1996    right  . Lumbar laminectomy  2003  . Cataract extraction      left eye    Family History  Problem Relation Age of Onset  . Thrombosis Father     mesenteric artery thrombosis after prostate surgery  . Colon cancer Mother     Social History History  Substance Use Topics  . Smoking status: Never Smoker   . Smokeless tobacco: Not on file  . Alcohol Use: No    Allergies  Allergen Reactions  . Tetanus Toxoids     Current Outpatient Prescriptions  Medication Sig Dispense Refill  . aMILoride (MIDAMOR) 5 MG tablet Take 1 tablet (5 mg total) by mouth daily.  30 tablet  3  . aspirin 81 MG tablet Take 81 mg by mouth daily.        Marland Kitchen  atorvastatin (LIPITOR) 10 MG tablet Take 10 mg by mouth daily. Take 1/2 tablet once a day      . azelastine (ASTELIN) 0.1 % nasal spray Place 1 spray into both nostrils 2 (two) times daily. Use in each nostril as directed  30 mL  3  . brimonidine (ALPHAGAN) 0.15 % ophthalmic solution Place 1 drop into both eyes 2 (two) times daily.        . diazepam (VALIUM) 2 MG tablet Take 1 tablet (2 mg total) by mouth daily.  90 tablet  3  . dorzolamide (TRUSOPT) 2 % ophthalmic solution 1 drop as directed.        Marland Kitchen EPINEPHrine (EPI-PEN) 0.3 mg/0.3 mL SOAJ injection Inject 0.3 mLs (0.3 mg total) into the muscle once. INJECT AS DIRECTED INTRAMUSCULAR PER DR KUMAR  1 Device  3  . fexofenadine (ALLEGRA) 180 MG tablet Take 180 mg by  mouth daily. 1/2 tablet once a day      . fluticasone (FLONASE) 50 MCG/ACT nasal spray Place 2 sprays into both nostrils 3 (three) times daily.  16 g  3  . furosemide (LASIX) 40 MG tablet Take 1/2 tablet by mouth  daily      . glucosamine-chondroitin 500-400 MG tablet Take 1 tablet by mouth 3 (three) times daily.        Marland Kitchen levothyroxine (SYNTHROID, LEVOTHROID) 50 MCG tablet Take 1 tablet (50 mcg total) by mouth daily.  90 tablet  3  . Multiple Vitamin (MULTIVITAMIN) capsule Take 1 capsule by mouth daily.        . pantoprazole (PROTONIX) 40 MG tablet Take 1 tablet (40 mg total) by mouth daily.  10 tablet  0  . polyethylene glycol powder (MIRALAX) powder Take 17 g by mouth as needed.        . Psyllium (METAMUCIL) 30.9 % POWD Take by mouth 3 (three) times daily with meals.         No current facility-administered medications for this visit.    Review of Systems  Constitutional: positive for fatigue Eyes: negative Ears, nose, mouth, throat, and face: negative Respiratory: positive for dyspnea on exertion Cardiovascular: negative Gastrointestinal: negative Genitourinary:negative Integument/breast: negative Hematologic/lymphatic: negative Musculoskeletal:negative Neurological: negative Behavioral/Psych: negative Endocrine: negative Allergic/Immunologic: negative  Physical Exam  MHW:KGSUP, healthy, no distress, well nourished and well developed SKIN: skin color, texture, turgor are normal, no rashes or significant lesions HEAD: Normocephalic, No masses, lesions, tenderness or abnormalities EYES: normal, PERRLA, Conjunctiva are pink and non-injected EARS: External ears normal, Canals clear OROPHARYNX:no exudate, no erythema and lips, buccal mucosa, and tongue normal  NECK: supple, no adenopathy, no JVD LYMPH:  no palpable lymphadenopathy, no hepatosplenomegaly LUNGS: clear to auscultation , and palpation HEART: regular rate & rhythm, no murmurs and no gallops ABDOMEN:abdomen soft,  non-tender, normal bowel sounds and no masses or organomegaly BACK: Back symmetric, no curvature., No CVA tenderness EXTREMITIES:no joint deformities, effusion, or inflammation, no edema, no skin discoloration  NEURO: alert & oriented x 3 with fluent speech, no focal motor/sensory deficits  PERFORMANCE STATUS: ECOG 2  LABORATORY DATA: Lab Results  Component Value Date   WBC 6.9 07/17/2014   HGB 10.5* 07/17/2014   HCT 32.9* 07/17/2014   MCV 102.2* 07/17/2014   PLT 86* 07/17/2014      Chemistry      Component Value Date/Time   NA 141 07/17/2014 1356   NA 140 07/04/2014 1324   K 4.6 07/17/2014 1356   K 4.5 07/04/2014 1324   CL 108  07/04/2014 1324   CO2 23 07/17/2014 1356   CO2 28 07/04/2014 1324   BUN 49.2* 07/17/2014 1356   BUN 36* 07/04/2014 1324   CREATININE 1.0 07/17/2014 1356   CREATININE 1.0 07/04/2014 1324      Component Value Date/Time   CALCIUM 8.7 07/17/2014 1356   CALCIUM 8.9 07/04/2014 1324   ALKPHOS 61 07/17/2014 1356   ALKPHOS 55 07/04/2014 1324   AST 25 07/17/2014 1356   AST 27 07/04/2014 1324   ALT 21 07/17/2014 1356   ALT 20 07/04/2014 1324   BILITOT 0.65 07/17/2014 1356   BILITOT 0.6 07/04/2014 1324       RADIOGRAPHIC STUDIES: No results found.  ASSESSMENT: This is a very pleasant 80 years old white male with persistent anemia and thrombocytopenia that has been on on for years with no significant deterioration compared to several years ago. The patient is currently asymptomatic except for mild fatigue.   PLAN: I had a lengthy discussion with the patient and his caregiver today about his current condition and further investigation to identify the etiology. I ordered several studies today again including repeat CBC/diff, reticulocyte count, comprehensive metabolic panel, and H., iron study, ferritin, serum erythropoietin, serum protein electrophoreses, serum folate and serum vitamin B12. Many of these results are still pending. I also discussed with the patient today consideration of  a bone marrow biopsy and aspirate to rule out myelodysplastic syndrome or any other bone marrow infiltrative process. The patient again declined this procedure and he mentions that he is feeling fine and doesn't want to consider any further investigation for his condition. I agreed with the patient's decision taking into consideration his age and other comorbidities. He will continue on observation for now with routine follow up visit with his primary care physician. I advised the patient to call me immediately if he has any concerning symptoms or if he changes his mind regarding proceeding with a bone marrow biopsy and aspirate. I would see him on as-needed basis at this point.  The patient voices understanding of current disease status and treatment options and is in agreement with the current care plan.  All questions were answered. The patient knows to call the clinic with any problems, questions or concerns. We can certainly see the patient much sooner if necessary.  Thank you so much for allowing me to participate in the care of Victor Green. I will continue to follow up the patient with you and assist in his care.  I spent 35 minutes counseling the patient face to face. The total time spent in the appointment was 55 minutes.  Disclaimer: This note was dictated with voice recognition software. Similar sounding words can inadvertently be transcribed and may not be corrected upon review.   Koltin Wehmeyer K. 07/17/2014, 2:49 PM

## 2014-07-19 LAB — PROTEIN ELECTROPHORESIS, SERUM, WITH REFLEX
Albumin ELP: 60.1 % (ref 55.8–66.1)
Alpha-1-Globulin: 5.8 % — ABNORMAL HIGH (ref 2.9–4.9)
Alpha-2-Globulin: 9.3 % (ref 7.1–11.8)
BETA 2: 4.2 % (ref 3.2–6.5)
BETA GLOBULIN: 6.5 % (ref 4.7–7.2)
GAMMA GLOBULIN: 14.1 % (ref 11.1–18.8)
Total Protein, Serum Electrophoresis: 5.9 g/dL — ABNORMAL LOW (ref 6.0–8.3)

## 2014-07-19 LAB — VITAMIN B12: Vitamin B-12: 925 pg/mL — ABNORMAL HIGH (ref 211–911)

## 2014-07-19 LAB — ERYTHROPOIETIN: Erythropoietin: 33.8 m[IU]/mL — ABNORMAL HIGH (ref 2.6–18.5)

## 2014-07-19 LAB — FOLATE: Folate: 18.6 ng/mL

## 2014-08-06 ENCOUNTER — Other Ambulatory Visit: Payer: Self-pay | Admitting: *Deleted

## 2014-08-06 MED ORDER — LEVOTHYROXINE SODIUM 50 MCG PO TABS: 50.0000 ug | ORAL_TABLET | Freq: Every day | ORAL | Status: DC

## 2014-08-16 ENCOUNTER — Other Ambulatory Visit: Payer: Self-pay | Admitting: Endocrinology

## 2014-08-20 ENCOUNTER — Other Ambulatory Visit: Payer: Self-pay | Admitting: *Deleted

## 2014-08-20 ENCOUNTER — Telehealth: Payer: Self-pay | Admitting: Endocrinology

## 2014-08-20 MED ORDER — AZELASTINE HCL 0.1 % NA SOLN: 1.0000 | Freq: Two times a day (BID) | NASAL | Status: AC

## 2014-08-20 MED ORDER — FLUTICASONE PROPIONATE 50 MCG/ACT NA SUSP: NASAL | Status: DC

## 2014-08-20 NOTE — Telephone Encounter (Signed)
flonase rx 30 day generic and astolin rx generic to [facility] please to hold him over

## 2014-08-20 NOTE — Telephone Encounter (Signed)
rx sent

## 2014-08-28 ENCOUNTER — Other Ambulatory Visit: Payer: Self-pay | Admitting: *Deleted

## 2014-08-28 MED ORDER — ATORVASTATIN CALCIUM 10 MG PO TABS: ORAL_TABLET | ORAL | Status: DC

## 2014-08-30 ENCOUNTER — Other Ambulatory Visit: Payer: Self-pay | Admitting: *Deleted

## 2014-08-30 MED ORDER — ATORVASTATIN CALCIUM 10 MG PO TABS: ORAL_TABLET | ORAL | Status: DC

## 2014-09-03 ENCOUNTER — Ambulatory Visit (INDEPENDENT_AMBULATORY_CARE_PROVIDER_SITE_OTHER): Payer: 59 | Admitting: Endocrinology

## 2014-09-03 ENCOUNTER — Encounter: Payer: Self-pay | Admitting: Endocrinology

## 2014-09-03 VITALS — BP 124/82 | HR 65 | Temp 98.2°F | Resp 14 | Ht 69.0 in | Wt 180.2 lb

## 2014-09-03 DIAGNOSIS — D539 Nutritional anemia, unspecified: Secondary | ICD-10-CM

## 2014-09-03 DIAGNOSIS — R609 Edema, unspecified: Secondary | ICD-10-CM

## 2014-09-03 NOTE — Progress Notes (Signed)
Patient ID: Victor Green, male   DOB: Aug 30, 1920, 80 y.o.   MRN: [id number]   Chief complaint: Followup of multiple problems  History of Present Illness:  1. Leg edema: he has had lower leg edema which has been persistent for a couple of years  Etiology is unclear as he has had no systemic disease or renal insufficiency.  It has been felt to be  from venous insufficiency.  Previously had used larger doses of diuretics including Zaroxolyn but currently only taking 40 mg, half tablet daily along with his amiloride He is not recently using elastic stockings because of practical difficulties He tries to elevate his foot of the bed at night but does not prop his feet up during the day  However does not think the swelling is troublesome now. Does not appear to have overall fluid retention with stable weights  Wt Readings from Last 3 Encounters:  09/03/14 180 lb 3.2 oz (81.738 kg)  07/17/14 182 lb 12.8 oz (82.918 kg)  07/04/14 182 lb 9.6 oz (82.827 kg)    2. History of hypertension: Previously also had hyperaldosteronism but has not had any hypokalemia.  Currently not requiring any antihypertensive medications except 5 mg amiloride.  Home blood pressures have been generally higher than in the office HOME Blood pressure readings recently about 129-158, checking times a week  3. Anemia: Hemoglobin has ranged from 10.8 -11.6 in the last few months. His last iron level was normal. Ferritin previously low normal at 38 and B12 normal but he is still taking iron. Asymptomatic with no unusual fatigue Hematology evaluation was negative  Lab Results  Component Value Date   WBC 6.9 07/17/2014   HGB 10.5* 07/17/2014   HCT 32.9* 07/17/2014   MCV 102.2* 07/17/2014   PLT 86* 07/17/2014    4.. Hypercholesterolemia: Mild and well-controlled with low dose Lipitor, tends to have low HDL.   Lab Results  Component Value Date   CHOL 98 07/04/2014   HDL 34.80* 07/04/2014   LDLCALC 44 07/04/2014   TRIG 95.0 07/04/2014   CHOLHDL 3 07/04/2014    5. ?  Hypothyroidism.  He has been on low-dose supplementation but since his last TSH was 0.54 he was told to stop his thyroid supplement Not clear if he has done this      Medication List       This list is accurate as of: 09/03/14  3:36 PM.  Always use your most recent med list.               albuterol 108 (90 BASE) MCG/ACT inhaler  Commonly known as:  PROVENTIL HFA;VENTOLIN HFA  Inhale 2 puffs into the lungs every 4 (four) hours as needed for wheezing or shortness of breath.     aMILoride 5 MG tablet  Commonly known as:  MIDAMOR  Take 1 tablet (5 mg total) by mouth daily.     aspirin 81 MG tablet  Take 81 mg by mouth daily.     atorvastatin 10 MG tablet  Commonly known as:  LIPITOR  Take 1 tablet daily     AVODART 0.5 MG capsule  Generic drug:  dutasteride     azelastine 0.1 % nasal spray  Commonly known as:  ASTELIN  Place 1 spray into both nostrils 2 (two) times daily. Use in each nostril as directed     brimonidine 0.15 % ophthalmic solution  Commonly known as:  ALPHAGAN  Place 1 drop into both eyes 2 (two) times daily.  diazepam 2 MG tablet  Commonly known as:  VALIUM  Take 1 tablet (2 mg total) by mouth daily.     dorzolamide 2 % ophthalmic solution  Commonly known as:  TRUSOPT  1 drop as directed.     EPINEPHrine 0.3 mg/0.3 mL Soaj injection  Commonly known as:  EPI-PEN  Inject 0.3 mLs (0.3 mg total) into the muscle once. INJECT AS DIRECTED INTRAMUSCULAR PER DR Lucretia Pendley     fexofenadine 180 MG tablet  Commonly known as:  ALLEGRA  Take 180 mg by mouth daily. 1/2 tablet once a day     fluticasone 50 MCG/ACT nasal spray  Commonly known as:  FLONASE  Use 2 sprays in each  nostril twice daily     FLUZONE HIGH-DOSE 0.5 ML Susy  Generic drug:  Influenza Vac Split High-Dose     furosemide 40 MG tablet  Commonly known as:  LASIX  Take 1/2 tablet by mouth  daily     glucosamine-chondroitin 500-400 MG  tablet  Take 1 tablet by mouth 3 (three) times daily.     levothyroxine 50 MCG tablet  Commonly known as:  SYNTHROID, LEVOTHROID  Take 1 tablet (50 mcg total) by mouth daily.     MIRALAX powder  Generic drug:  polyethylene glycol powder  Take 17 g by mouth as needed.     multivitamin capsule  Take 1 capsule by mouth daily.     pantoprazole 40 MG tablet  Commonly known as:  PROTONIX  Take 1 tablet (40 mg total) by mouth daily.        Allergies:  Allergies  Allergen Reactions  . Tetanus Toxoids     Past Medical History  Diagnosis Date  . Hypertension   . CAD (coronary artery disease) complete w/u in 2009    extensive coronary calcification by EBCT, normal myoview, echo with EF 70% mild AI  . Hyperlipidemia   . Aortic insufficiency     echo July 2011 EF 60%  . 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 History of GI bleed 2003    due to pyloric-positive ulcer   . Diverticulosis     with remote history of diverticulitis  . Plantar fasciitis     chronic  . Allergic rhinitis     responding well to desensitation injections  . Glaucoma   . Osteoarthritis     both knees  . Asthma     Past Surgical History  Procedure Laterality Date  . Tonsillectomy    . Hemorrhoid surgery  1996  . Hernia repair  1996    right  . Lumbar laminectomy  2003  . Cataract extraction      left eye    Family History  Problem Relation Age of Onset  . Thrombosis Father     mesenteric artery thrombosis after prostate surgery  . Colon cancer Mother     Social History:  reports that he has never smoked. He does not have any smokeless tobacco history on file. He reports that he does not drink alcohol or use illicit drugs.  Review of Systems -   Has had osteoarthritis in multiple joints especially knees   Has history of reflux and taking PPI drugs   No history of symptomatic coronary artery disease  History of increased PSA, probable prostate cancer, his last PSA had  been just above normal, has not followed up with urologist  Lab Results  Component Value Date   PSA  4.32* 07/19/2013   has gynecomastia. This has been present for some time and also associated with relatively low testosterone level  Lab Results  Component Value Date   TESTOSTERONE 190.7* 12/21/2013    EXAM:  BP 124/82 mmHg  Pulse 65  Temp(Src) 98.2 F (36.8 C)  Resp 14  Ht 5\' 9"  (1.753 m)  Wt 180 lb 3.2 oz (81.738 kg)  BMI 26.60 kg/m2  SpO2 96%  Looks well, well built and nourished  Heart sounds normal with regular rhythm, slightly distant Lungs clear  1+ left pedal, more on left leg and no edema on the right side at the ankles  Assessment/Plan:   EDEMA: This is  related to venous insufficiency, more on the left side He has not used the elastic stockings consistently and advised him to at least elevate his feet during the day  He can continue low-dose Lasix 20 mg along with 5 mg amiloride  History of mild hypothyroidism: Advised him not to take any supplements now since his last TSH was relatively low and he probably has minimal subclinical hypothyroidism that may not need to be treated at his age  [facility]

## 2014-09-03 NOTE — Patient Instructions (Signed)
Stop Levothyroxine

## 2014-09-04 ENCOUNTER — Other Ambulatory Visit: Payer: Self-pay | Admitting: *Deleted

## 2014-09-04 MED ORDER — FUROSEMIDE 40 MG PO TABS: ORAL_TABLET | ORAL | Status: DC

## 2014-09-24 ENCOUNTER — Other Ambulatory Visit: Payer: Self-pay | Admitting: *Deleted

## 2014-09-24 MED ORDER — DIAZEPAM 2 MG PO TABS: 2.0000 mg | ORAL_TABLET | Freq: Every day | ORAL | Status: DC

## 2014-11-08 ENCOUNTER — Other Ambulatory Visit: Payer: Self-pay | Admitting: Endocrinology

## 2014-12-05 ENCOUNTER — Ambulatory Visit (INDEPENDENT_AMBULATORY_CARE_PROVIDER_SITE_OTHER): Payer: 59 | Admitting: Endocrinology

## 2014-12-05 VITALS — BP 138/68 | HR 69 | Temp 97.2°F | Wt 178.1 lb

## 2014-12-05 DIAGNOSIS — D509 Iron deficiency anemia, unspecified: Secondary | ICD-10-CM

## 2014-12-05 DIAGNOSIS — R609 Edema, unspecified: Secondary | ICD-10-CM

## 2014-12-05 DIAGNOSIS — E039 Hypothyroidism, unspecified: Secondary | ICD-10-CM

## 2014-12-05 DIAGNOSIS — Z23 Encounter for immunization: Secondary | ICD-10-CM

## 2014-12-05 DIAGNOSIS — E7800 Pure hypercholesterolemia, unspecified: Secondary | ICD-10-CM

## 2014-12-05 DIAGNOSIS — I10 Essential (primary) hypertension: Secondary | ICD-10-CM

## 2014-12-05 DIAGNOSIS — D539 Nutritional anemia, unspecified: Secondary | ICD-10-CM

## 2014-12-05 LAB — CBC
HCT: 33.7 % — ABNORMAL LOW (ref 39.0–52.0)
Hemoglobin: 11.2 g/dL — ABNORMAL LOW (ref 13.0–17.0)
MCHC: 33.4 g/dL (ref 30.0–36.0)
MCV: 99.3 fl (ref 78.0–100.0)
Platelets: 79 10*3/uL — ABNORMAL LOW (ref 150.0–400.0)
RBC: 3.4 Mil/uL — ABNORMAL LOW (ref 4.22–5.81)
RDW: 16.8 % — ABNORMAL HIGH (ref 11.5–15.5)
WBC: 7.4 10*3/uL (ref 4.0–10.5)

## 2014-12-05 LAB — BASIC METABOLIC PANEL
BUN: 48 mg/dL — ABNORMAL HIGH (ref 6–23)
CO2: 29 mEq/L (ref 19–32)
Calcium: 9.2 mg/dL (ref 8.4–10.5)
Chloride: 109 mEq/L (ref 96–112)
Creatinine, Ser: 1.17 mg/dL (ref 0.40–1.50)
GFR: 61.88 mL/min (ref >60.00)
Glucose, Bld: 96 mg/dL (ref 70–99)
Potassium: 4.4 mEq/L (ref 3.5–5.1)
SODIUM: 140 meq/L (ref 135–145)

## 2014-12-05 LAB — T4, FREE: Free T4: 0.95 ng/dL (ref 0.60–1.60)

## 2014-12-05 LAB — TSH: TSH: 8.06 u[IU]/mL — ABNORMAL HIGH (ref 0.35–4.50)

## 2014-12-05 NOTE — Patient Instructions (Addendum)
?   Taking levothyroxine

## 2014-12-05 NOTE — Progress Notes (Signed)
Pre visit review using our clinic review tool, if applicable. No additional management support is needed unless otherwise documented below in the visit note. 

## 2014-12-05 NOTE — Progress Notes (Signed)
Patient ID: Victor Green, male   DOB: 18-Nov-1919, 81 y.o.   MRN: [id number]   Chief complaint: Review of multiple problems  History of Present Illness:  1.  ?  memory loss His caregiver thinks that he has developed memory loss but she was not present in the exam room today The patient thinks that he sometimes asks the same question again with his family but does not think this is related to memory loss He is trying to test his memory on his own and he thinks this is fairly good.  He is not keen on thinking about medications for this   2.  Leg edema: he has had lower leg edema which has been persistent for a couple of years  Etiology is unclear as he has had no systemic disease or renal insufficiency.  It has been felt to be  from venous insufficiency.  Previously had used larger doses of diuretics including Zaroxolyn but currently only taking 40 mg, half tablet daily along with his amiloride. He says that his swelling is significantly better now He is not  using elastic stockings because of practical difficulties putting them on. No recent weight gain and weight is trending lower  Wt Readings from Last 3 Encounters:  12/05/14 178 lb 2 oz (80.797 kg)  09/03/14 180 lb 3.2 oz (81.738 kg)  07/17/14 182 lb 12.8 oz (82.918 kg)    2. History of hypertension: Previously also had hyperaldosteronism but has not had any hypokalemia.  Currently not requiring any antihypertensive medications except 5 mg amiloride.  Home blood pressures have been generally higher than in the office HOME Blood pressure readings again are fairly good, mostly 120-140 range checking a couple of times a week  3. Anemia: Hemoglobin has ranged from 10.8 -11.6. His last iron level was normal.  Ferritin previously low normal at 38 and B12 normal and he is still taking iron.  Hematology evaluation was negative and no further follow-up recommended He does not complain of fatigue or shortness of breath  Lab Results   Component Value Date   WBC 6.9 07/17/2014   HGB 10.5* 07/17/2014   HCT 32.9* 07/17/2014   MCV 102.2* 07/17/2014   PLT 86* 07/17/2014    4.. Hypercholesterolemia: Mild and well-controlled with low dose Lipitor, tends to have low HDL.   Lab Results  Component Value Date   CHOL 98 07/04/2014   HDL 34.80* 07/04/2014   LDLCALC 44 07/04/2014   TRIG 95.0 07/04/2014   CHOLHDL 3 07/04/2014    5. ?  Hypothyroidism.  He has been on low-dose supplementation but since his last TSH was 0.54 he was told to stop his thyroid supplement He thinks he has done this but he's not completely sure   Lab Results  Component Value Date   TSH 0.54 07/04/2014   TSH 2.48 12/21/2013   TSH 3.11 07/19/2013   FREET4 1.12 07/04/2014   FREET4 0.95 12/21/2013   FREET4 1.01 07/19/2013    6.  His caregiver is concerned about a mole on the side of his face, not clear if this is new      Medication List       This list is accurate as of: 12/05/14  3:39 PM.  Always use your most recent med list.               albuterol 108 (90 BASE) MCG/ACT inhaler  Commonly known as:  PROVENTIL HFA;VENTOLIN HFA  Inhale 2 puffs into the lungs every  4 (four) hours as needed for wheezing or shortness of breath.     aMILoride 5 MG tablet  Commonly known as:  MIDAMOR  Take 1 tablet (5 mg total) by mouth daily.     aMILoride 5 MG tablet  Commonly known as:  MIDAMOR  Take 1 tablet by mouth  daily     aspirin 81 MG tablet  Take 81 mg by mouth daily.     atorvastatin 10 MG tablet  Commonly known as:  LIPITOR  Take 1 tablet daily     AVODART 0.5 MG capsule  Generic drug:  dutasteride     azelastine 0.1 % nasal spray  Commonly known as:  ASTELIN  Place 1 spray into both nostrils 2 (two) times daily. Use in each nostril as directed     brimonidine 0.15 % ophthalmic solution  Commonly known as:  ALPHAGAN  Place 1 drop into both eyes 2 (two) times daily.     diazepam 2 MG tablet  Commonly known as:  VALIUM   Take 1 tablet (2 mg total) by mouth daily.     dorzolamide 2 % ophthalmic solution  Commonly known as:  TRUSOPT  1 drop as directed.     EPINEPHrine 0.3 mg/0.3 mL Soaj injection  Commonly known as:  EPI-PEN  Inject 0.3 mLs (0.3 mg total) into the muscle once. INJECT AS DIRECTED INTRAMUSCULAR PER DR Ninamarie Keel     fexofenadine 180 MG tablet  Commonly known as:  ALLEGRA  Take 180 mg by mouth daily. 1/2 tablet once a day     fluticasone 50 MCG/ACT nasal spray  Commonly known as:  FLONASE  Use 2 sprays in each  nostril twice daily     FLUZONE HIGH-DOSE 0.5 ML Susy  Generic drug:  Influenza Vac Split High-Dose     furosemide 40 MG tablet  Commonly known as:  LASIX  Take 1/2 tablet daily     glucosamine-chondroitin 500-400 MG tablet  Take 1 tablet by mouth 3 (three) times daily.     MIRALAX powder  Generic drug:  polyethylene glycol powder  Take 17 g by mouth as needed.     multivitamin capsule  Take 1 capsule by mouth daily.     pantoprazole 40 MG tablet  Commonly known as:  PROTONIX  Take 1 tablet (40 mg total) by mouth daily.        Allergies:  Allergies  Allergen Reactions  . Tetanus Toxoids     Past Medical History  Diagnosis Date  . Hypertension   . CAD (coronary artery disease) complete w/u in 2009    extensive coronary calcification by EBCT, normal myoview, echo with EF 70% mild AI  . Hyperlipidemia   . Aortic insufficiency     echo July 2011 EF 60%  . 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 History of GI bleed 2003    due to pyloric-positive ulcer   . Diverticulosis     with remote history of diverticulitis  . Plantar fasciitis     chronic  . Allergic rhinitis     responding well to desensitation injections  . Glaucoma   . Osteoarthritis     both knees  . Asthma     Past Surgical History  Procedure Laterality Date  . Tonsillectomy    . Hemorrhoid surgery  1996  . Hernia repair  1996    right  . Lumbar laminectomy  2003  .  Cataract extraction  left eye    Family History  Problem Relation Age of Onset  . Thrombosis Father     mesenteric artery thrombosis after prostate surgery  . Colon cancer Mother     Social History:  reports that he has never smoked. He does not have any smokeless tobacco history on file. He reports that he does not drink alcohol or use illicit drugs.  Review of Systems -   Medication list reviewed in detail  Has had osteoarthritis in multiple joints especially knees, using a walker  He is asking about a skin tag on the right side of his face   Has history of reflux and taking Protonix  History of increased PSA, probable prostate cancer, his last PSA had been just above normal, has not followed up with urologist  Lab Results  Component Value Date   PSA 4.32* 07/19/2013   Gynecomastia has been present for some time and also associated with relatively low testosterone level  Lab Results  Component Value Date   TESTOSTERONE 190.7* 12/21/2013    EXAM:  BP 138/68 mmHg  Pulse 69  Temp(Src) 97.2 F (36.2 C) (Oral)  Wt 178 lb 2 oz (80.797 kg)  SpO2 92%  Looks well   No lymphadenopathy in the neck No thyroid enlargement Has benign looking tiny skin tag on the right side of the face below the temple No significant related edema present on either leg   Assessment/Plan:   ?  Memory loss:  Not clear if this is more than mild age-related issue.  He is not convinced that he has a problem and does not want to consider medications also.   He has not had any treatable causes in the past with normal B-12 levels and previously thyroid levels were normal    Have advised him to continue to monitor  Memory functions and call if worse.  He will have thyroid levels checked again.  Persistent anemia of unclear etiology : CBC to be checked along with folate level. B-12 previously normal. May be related to chronic disease and other factors such as hypogonadism  HYPERLIPIDEMIA:  He has had only minimal hyperlipidemia but has been on long-term Lipitor prophylactically with relatively low LDL levels, he is comfortable taking this  Joint pains: Stable  EDEMA: This is  related to venous insufficiency,  Now not a problem He can continue low-dose Lasix 20 mg along with 5 mg amiloride  History of mild hypothyroidism:   This may have been subclinical. Will check levels again today and decide on further management.  History of hyperaldosteronism: Recheck electrolytes and sodium while taking diuretics  Preventive care: He apparently has not had a Prevnar injection and his record of Pneumovax is not available at this time, may have had this in 1999 .  Discussed Prevnar and he will have this today    Kyndal Gloster

## 2014-12-26 ENCOUNTER — Other Ambulatory Visit: Payer: Self-pay | Admitting: Endocrinology

## 2015-01-01 ENCOUNTER — Other Ambulatory Visit: Payer: Self-pay | Admitting: Endocrinology

## 2015-01-09 ENCOUNTER — Other Ambulatory Visit: Payer: Self-pay | Admitting: Endocrinology

## 2015-03-06 ENCOUNTER — Ambulatory Visit: Payer: Medicare Other | Admitting: Endocrinology

## 2015-03-18 ENCOUNTER — Encounter: Payer: Self-pay | Admitting: Endocrinology

## 2015-03-18 ENCOUNTER — Ambulatory Visit (INDEPENDENT_AMBULATORY_CARE_PROVIDER_SITE_OTHER): Payer: 59 | Admitting: Endocrinology

## 2015-03-18 VITALS — BP 124/60 | HR 69 | Temp 98.1°F | Resp 16 | Ht 69.0 in | Wt 181.2 lb

## 2015-03-18 DIAGNOSIS — I951 Orthostatic hypotension: Secondary | ICD-10-CM

## 2015-03-18 DIAGNOSIS — I10 Essential (primary) hypertension: Secondary | ICD-10-CM

## 2015-03-18 DIAGNOSIS — E7800 Pure hypercholesterolemia, unspecified: Secondary | ICD-10-CM

## 2015-03-18 DIAGNOSIS — D539 Nutritional anemia, unspecified: Secondary | ICD-10-CM

## 2015-03-18 DIAGNOSIS — D509 Iron deficiency anemia, unspecified: Secondary | ICD-10-CM

## 2015-03-18 DIAGNOSIS — E039 Hypothyroidism, unspecified: Secondary | ICD-10-CM

## 2015-03-18 NOTE — Progress Notes (Signed)
Patient ID: Victor Green, male   DOB: 1920-09-12, 81 y.o.   MRN: [id number]   Chief complaint: Review of multiple problems  History of Present Illness:   1.  History of hypertension: Previously  had hypertension with mild hyperaldosteronism but has not had any hypokalemia.  Currently not requiring any antihypertensive medications except 5 mg amiloride.  Home blood pressures have been generally higher than in the office  Blood pressure readings again at home are fairly good, mostly 120-140 range with only one reading of 152; still checking a couple of times a week.  No lightheadedness on standing up   2.  Leg edema: he has had lower leg edema which has been persistent for a couple of years  Etiology is unclear as he has had no systemic disease or renal insufficiency.  It has been felt to be  from venous insufficiency.  A few years ago had used larger doses of diuretics including Zaroxolyn but currently only taking 40 mg, half tablet daily along with his amiloride.  No recent recurrence of edema but still takes 20 mg of Lasix He is not  using elastic stockings because of practical difficulties putting them on.  Wt Readings from Last 3 Encounters:  03/18/15 181 lb 3.2 oz (82.192 kg)  12/05/14 178 lb 2 oz (80.797 kg)  09/03/14 180 lb 3.2 oz (81.738 kg)     3. Anemia: Hemoglobin has ranged from 10.8 -11.6. His last iron level was normal.  Ferritin previously low normal at 38 and B12 normal and he is still taking iron.  Hematology evaluation was negative and no further follow-up recommended Hemoglobin is relatively stable now  However he continues to have persistent thrombocytopenia of unclear etiology, no bleeding tendencies   Lab Results  Component Value Date   WBC 7.4 12/05/2014   HGB 11.2* 12/05/2014   HCT 33.7* 12/05/2014   MCV 99.3 12/05/2014   PLT 79.0 Repeated and verified X2.* 12/05/2014    4.. Hypercholesterolemia: Mild and well-controlled with low dose Lipitor,  tends to have low HDL.   Lab Results  Component Value Date   CHOL 98 07/04/2014   HDL 34.80* 07/04/2014   LDLCALC 44 07/04/2014   TRIG 95.0 07/04/2014   CHOLHDL 3 07/04/2014    5.   Hypothyroidism.  He has been on low-dose supplementation with 25 g again since his TSH had gone up to 8.1 in 2/16 with stopping his thyroid supplement in 9/15 He does not feel any different with starting the thyroid supplement including any changes in concentration or memory   Lab Results  Component Value Date   TSH 8.06* 12/05/2014   TSH 0.54 07/04/2014   TSH 2.48 12/21/2013   FREET4 0.95 12/05/2014   FREET4 1.12 07/04/2014   FREET4 0.95 12/21/2013           Medication List       This list is accurate as of: 03/18/15  4:40 PM.  Always use your most recent med list.               albuterol 108 (90 BASE) MCG/ACT inhaler  Commonly known as:  PROVENTIL HFA;VENTOLIN HFA  Inhale 2 puffs into the lungs every 4 (four) hours as needed for wheezing or shortness of breath.     aMILoride 5 MG tablet  Commonly known as:  MIDAMOR  Take 1 tablet by mouth  daily     aspirin 81 MG tablet  Take 81 mg by mouth daily.  atorvastatin 10 MG tablet  Commonly known as:  LIPITOR  Take 1 tablet by mouth  daily     AVODART 0.5 MG capsule  Generic drug:  dutasteride     azelastine 0.1 % nasal spray  Commonly known as:  ASTELIN  Place 1 spray into both nostrils 2 (two) times daily. Use in each nostril as directed     B-COMPLEX PO  Take by mouth.     brimonidine 0.15 % ophthalmic solution  Commonly known as:  ALPHAGAN  Place 1 drop into both eyes 2 (two) times daily.     ALPHAGAN P 0.1 % Soln  Generic drug:  brimonidine     dextromethorphan-guaiFENesin 30-600 MG per 12 hr tablet  Commonly known as:  MUCINEX DM  Take 1 tablet by mouth daily. Takes 1/2 tablet daily     diazepam 2 MG tablet  Commonly known as:  VALIUM  Take 1 tablet (2 mg total) by mouth daily.     dorzolamide 2 %  ophthalmic solution  Commonly known as:  TRUSOPT  1 drop as directed.     EPIPEN 2-PAK 0.3 mg/0.3 mL Soaj injection  Generic drug:  EPINEPHrine  Inject 0.23ml (0.3mg ) into  the muscle once as directed     fexofenadine 180 MG tablet  Commonly known as:  ALLEGRA  Take 180 mg by mouth daily. 1/2 tablet once a day     fluticasone 50 MCG/ACT nasal spray  Commonly known as:  FLONASE  Use 2 sprays in each  nostril twice daily     FLUZONE HIGH-DOSE 0.5 ML Susy  Generic drug:  Influenza Vac Split High-Dose     furosemide 40 MG tablet  Commonly known as:  LASIX  Take one-half tablet by  mouth daily     glucosamine-chondroitin 500-400 MG tablet  Take 1 tablet by mouth 3 (three) times daily.     levothyroxine 50 MCG tablet  Commonly known as:  SYNTHROID, LEVOTHROID  Take 1 tablet by mouth  daily     MIRALAX powder  Generic drug:  polyethylene glycol powder  Take 17 g by mouth as needed.     multivitamin capsule  Take 1 capsule by mouth daily.     pantoprazole 40 MG tablet  Commonly known as:  PROTONIX  Take 1 tablet by mouth  daily        Allergies:  Allergies  Allergen Reactions  . Tetanus Toxoids     Past Medical History  Diagnosis Date  . Hypertension   . CAD (coronary artery disease) complete w/u in 2009    extensive coronary calcification by EBCT, normal myoview, echo with EF 70% mild AI  . Hyperlipidemia   . Aortic insufficiency     echo July 2011 EF 60%  . 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 History of GI bleed 2003    due to pyloric-positive ulcer   . Diverticulosis     with remote history of diverticulitis  . Plantar fasciitis     chronic  . Allergic rhinitis     responding well to desensitation injections  . Glaucoma   . Osteoarthritis     both knees  . Asthma     Past Surgical History  Procedure Laterality Date  . Tonsillectomy    . Hemorrhoid surgery  1996  . Hernia repair  1996    right  . Lumbar laminectomy  2003  .  Cataract extraction  left eye    Family History  Problem Relation Age of Onset  . Thrombosis Father     mesenteric artery thrombosis after prostate surgery  . Colon cancer Mother     Social History:  reports that he has never smoked. He does not have any smokeless tobacco history on file. He reports that he does not drink alcohol or use illicit drugs.  Review of Systems -   He has had some urgency of urination but no incontinence  Has had osteoarthritis in multiple joints especially knees, using a walker  He does not think he has significant memory problems   Has history of reflux and taking Protonix  History of increased PSA, probable prostate cancer, his last PSA had been just above normal, has not followed up with urologist  Lab Results  Component Value Date   PSA 4.32* 07/19/2013   Gynecomastia has been present for some time and also associated with relatively low testosterone level, testosterone supplements none have not been used because of his history of high PSA and age  Lab Results  Component Value Date   TESTOSTERONE 190.7* 12/21/2013    EXAM:  BP 124/60 mmHg  Pulse 69  Temp(Src) 98.1 F (36.7 C)  Resp 16  Ht 5\' 9"  (1.753 m)  Wt 181 lb 3.2 oz (82.192 kg)  BMI 26.75 kg/m2  SpO2 94%  Standing blood pressure 102/60  Looks well   No thyroid enlargement   No significant related edema present on either leg, has trace edema on the left side  Heart sounds normal Lungs clear  Assessment/Plan:   Orthostatic hypotension: His blood pressure is relatively low standing up and this is not safe at his age and this was discussed He was asked to stop Lasix  Mild hypothyroidism: He has been on 25 g levothyroxine because of TSH of 8 and potential effect of hypothyroidism on memory TSH to be rechecked today  Persistent anemia of unclear etiology : CBC to be checked Most likely anemia eyes related to chronic disease and other factors such as  hypogonadism  HYPERLIPIDEMIA: He has had only minimal hyperlipidemia but has been on long-term Lipitor prophylactically with relatively low LDL levels, he is comfortable taking this  Joint pains: Stable  EDEMA: This is  related to venous insufficiency,  Since his blood pressure is low standing up he will have to stop Lasix at this time May consider restarting this and stopping amiloride if edema recurs Renal function and electrolytes to be checked today   His multiple issues as above were discussed in detail again and discussed need for adjusting medications as above and will review his labs when available with him    [facility]

## 2015-03-18 NOTE — Patient Instructions (Signed)
Stop Lasix  May use elastic stocking if swelling

## 2015-03-19 LAB — CBC
HEMATOCRIT: 33.1 % — AB (ref 39.0–52.0)
HEMOGLOBIN: 11 g/dL — AB (ref 13.0–17.0)
MCHC: 33.2 g/dL (ref 30.0–36.0)
MCV: 98.4 fl (ref 78.0–100.0)
Platelets: 72 10*3/uL — ABNORMAL LOW (ref 150.0–400.0)
RBC: 3.36 Mil/uL — ABNORMAL LOW (ref 4.22–5.81)
RDW: 16.9 % — ABNORMAL HIGH (ref 11.5–15.5)
WBC: 6.9 10*3/uL (ref 4.0–10.5)

## 2015-03-19 LAB — COMPREHENSIVE METABOLIC PANEL
ALK PHOS: 58 U/L (ref 39–117)
ALT: 26 U/L (ref 0–53)
AST: 30 U/L (ref 0–37)
Albumin: 4 g/dL (ref 3.5–5.2)
BUN: 46 mg/dL — ABNORMAL HIGH (ref 6–23)
CHLORIDE: 107 meq/L (ref 96–112)
CO2: 28 mEq/L (ref 19–32)
Calcium: 9.4 mg/dL (ref 8.4–10.5)
Creatinine, Ser: 1.03 mg/dL (ref 0.40–1.50)
GFR: 71.64 mL/min (ref >60.00)
GLUCOSE: 101 mg/dL — AB (ref 70–99)
POTASSIUM: 4.8 meq/L (ref 3.5–5.1)
Sodium: 140 mEq/L (ref 135–145)
TOTAL PROTEIN: 6.5 g/dL (ref 6.0–8.3)
Total Bilirubin: 0.6 mg/dL (ref 0.2–1.2)

## 2015-03-19 LAB — TSH: TSH: 3.82 u[IU]/mL (ref 0.35–4.50)

## 2015-03-19 LAB — LIPID PANEL
CHOLESTEROL: 106 mg/dL (ref 0–200)
HDL: 39.8 mg/dL (ref >39.00)
LDL CALC: 45 mg/dL (ref 0–99)
NONHDL: 66.2
Total CHOL/HDL Ratio: 3
Triglycerides: 106 mg/dL (ref 0.0–149.0)
VLDL: 21.2 mg/dL (ref 0.0–40.0)

## 2015-03-19 LAB — T4, FREE: FREE T4: 0.79 ng/dL (ref 0.60–1.60)

## 2015-03-22 NOTE — Progress Notes (Signed)
Quick Note:  Please let patient know that the lab results are stable and no new recommendations  ______

## 2015-04-24 ENCOUNTER — Other Ambulatory Visit: Payer: Self-pay | Admitting: Endocrinology

## 2015-04-29 ENCOUNTER — Ambulatory Visit: Payer: Medicare Other | Admitting: Endocrinology

## 2015-04-30 ENCOUNTER — Other Ambulatory Visit: Payer: Self-pay | Admitting: *Deleted

## 2015-04-30 MED ORDER — DUTASTERIDE 0.5 MG PO CAPS: 0.5000 mg | ORAL_CAPSULE | Freq: Every day | ORAL | Status: DC

## 2015-05-06 ENCOUNTER — Other Ambulatory Visit: Payer: Self-pay | Admitting: Endocrinology

## 2015-05-07 ENCOUNTER — Other Ambulatory Visit: Payer: Self-pay | Admitting: Endocrinology

## 2015-05-07 MED ORDER — AMILORIDE HCL 5 MG PO TABS: ORAL_TABLET | ORAL | Status: DC

## 2015-05-09 ENCOUNTER — Ambulatory Visit (INDEPENDENT_AMBULATORY_CARE_PROVIDER_SITE_OTHER): Payer: 59 | Admitting: Endocrinology

## 2015-05-09 ENCOUNTER — Encounter: Payer: Self-pay | Admitting: Endocrinology

## 2015-05-09 ENCOUNTER — Other Ambulatory Visit: Payer: Self-pay | Admitting: *Deleted

## 2015-05-09 VITALS — BP 108/62 | HR 63 | Temp 97.7°F | Resp 16 | Ht 69.0 in | Wt 180.8 lb

## 2015-05-09 DIAGNOSIS — E039 Hypothyroidism, unspecified: Secondary | ICD-10-CM

## 2015-05-09 DIAGNOSIS — I951 Orthostatic hypotension: Secondary | ICD-10-CM | POA: Diagnosis not present

## 2015-05-09 DIAGNOSIS — E7800 Pure hypercholesterolemia, unspecified: Secondary | ICD-10-CM

## 2015-05-09 DIAGNOSIS — E559 Vitamin D deficiency, unspecified: Secondary | ICD-10-CM

## 2015-05-09 DIAGNOSIS — D696 Thrombocytopenia, unspecified: Secondary | ICD-10-CM | POA: Diagnosis not present

## 2015-05-09 MED ORDER — DICLOFENAC SODIUM 1 % TD GEL: 4.0000 g | Freq: Four times a day (QID) | TRANSDERMAL | Status: DC

## 2015-05-09 NOTE — Progress Notes (Signed)
Patient ID: Victor Green, male   DOB: 08/20/1920, 81 y.o.   MRN: [id number]   Chief complaint: Review of multiple problems  History of Present Illness:   1.  History of hypertension: Previously  had hypertension with mild hyperaldosteronism but has not had any hypokalemia.  Currently not requiring any antihypertensive medications but is still taking 5 mg amiloride, more for edema.  Home blood pressures have been generally higher than in the office, mostly 120-140 range but he did not bring his recent readings for review Sometimes checks his blood pressure with the mercury manometer   2.  Leg edema: he has had lower leg edema which has been persistent for a couple of years  Etiology is unclear as he has had no systemic disease or renal insufficiency.  It has been felt to be  from venous insufficiency.   No recent recurrence of edema with stopping Lasix on his last visit because of low normal blood pressure He is not  using elastic stockings because of practical difficulties putting them on.  Wt Readings from Last 3 Encounters:  05/09/15 180 lb 12.8 oz (82.01 kg)  03/18/15 181 lb 3.2 oz (82.192 kg)  12/05/14 178 lb 2 oz (80.797 kg)     3. Anemia: Hemoglobin has ranged from 10.8 -11.6. His last iron level was normal.  Ferritin previously low normal at 38 and B12 normal and he is still taking iron.  Hematology evaluation was negative and no further follow-up recommended Hemoglobin is relatively stable now  However he continues to have persistent thrombocytopenia of unclear etiology, no bleeding tendencies He is still taking 81 mg aspirin   Lab Results  Component Value Date   WBC 6.9 03/18/2015   HGB 11.0* 03/18/2015   HCT 33.1* 03/18/2015   MCV 98.4 03/18/2015   PLT 72.0 Repeated and verified X2.* 03/18/2015    4.. Hypercholesterolemia: Mild and well-controlled with low dose Lipitor, tends to have low HDL.   Lab Results  Component Value Date   CHOL 106 03/18/2015   HDL  39.80 03/18/2015   LDLCALC 45 03/18/2015   TRIG 106.0 03/18/2015   CHOLHDL 3 03/18/2015     5.   Hypothyroidism.  He has been on low-dose supplementation with 25 g again since his TSH had gone up to 8.1 in 2/16 with stopping his thyroid supplement in 9/15 He does not feel any different with starting the thyroid supplement including any changes in concentration or memory   Lab Results  Component Value Date   TSH 3.82 03/18/2015   TSH 8.06* 12/05/2014   TSH 0.54 07/04/2014   FREET4 0.79 03/18/2015   FREET4 0.95 12/05/2014   FREET4 1.12 07/04/2014           Medication List       This list is accurate as of: 05/09/15  1:37 PM.  Always use your most recent med list.               acetaminophen 325 MG suppository  Commonly known as:  TYLENOL  Place 325 mg rectally every 4 (four) hours as needed.     albuterol 108 (90 BASE) MCG/ACT inhaler  Commonly known as:  PROVENTIL HFA;VENTOLIN HFA  Inhale 2 puffs into the lungs every 4 (four) hours as needed for wheezing or shortness of breath.     aMILoride 5 MG tablet  Commonly known as:  MIDAMOR  Take 1 tablet by mouth  daily     aspirin 81 MG tablet  Take 81 mg by mouth daily.     atorvastatin 10 MG tablet  Commonly known as:  LIPITOR  Take 1 tablet by mouth  daily     azelastine 0.1 % nasal spray  Commonly known as:  ASTELIN  Place 1 spray into both nostrils 2 (two) times daily. Use in each nostril as directed     B-COMPLEX PO  Take by mouth.     brimonidine 0.15 % ophthalmic solution  Commonly known as:  ALPHAGAN  Place 1 drop into both eyes 2 (two) times daily.     ALPHAGAN P 0.1 % Soln  Generic drug:  brimonidine     dextromethorphan-guaiFENesin 30-600 MG per 12 hr tablet  Commonly known as:  MUCINEX DM  Take 1 tablet by mouth daily. Takes 1/2 tablet daily     diazepam 2 MG tablet  Commonly known as:  VALIUM  Take 1 tablet (2 mg total) by mouth daily.     diclofenac sodium 1 % Gel  Commonly known as:   VOLTAREN  Apply topically 4 (four) times daily.     dorzolamide 2 % ophthalmic solution  Commonly known as:  TRUSOPT  1 drop as directed.     dutasteride 0.5 MG capsule  Commonly known as:  AVODART  Take 1 capsule (0.5 mg total) by mouth daily.     EPIPEN 2-PAK 0.3 mg/0.3 mL Soaj injection  Generic drug:  EPINEPHrine  Inject 0.13ml (0.3mg ) into  the muscle once as directed     fexofenadine 180 MG tablet  Commonly known as:  ALLEGRA  Take 180 mg by mouth daily. 1/2 tablet once a day     fluticasone 50 MCG/ACT nasal spray  Commonly known as:  FLONASE  Use 2 sprays in each  nostril twice daily     FLUZONE HIGH-DOSE 0.5 ML Susy  Generic drug:  Influenza Vac Split High-Dose     furosemide 40 MG tablet  Commonly known as:  LASIX  Take one-half tablet by  mouth daily     glucosamine-chondroitin 500-400 MG tablet  Take 1 tablet by mouth 3 (three) times daily.     Iron (Ferrous Gluconate) 256 (28 FE) MG Tabs  Take by mouth.     levothyroxine 50 MCG tablet  Commonly known as:  SYNTHROID, LEVOTHROID  Take 1 tablet by mouth  daily     MIRALAX powder  Generic drug:  polyethylene glycol powder  Take 17 g by mouth as needed.     multivitamin capsule  Take 1 capsule by mouth daily.     pantoprazole 40 MG tablet  Commonly known as:  PROTONIX  Take 1 tablet by mouth  daily        Allergies:  Allergies  Allergen Reactions  . Tetanus Toxoids     Past Medical History  Diagnosis Date  . Hypertension   . CAD (coronary artery disease) complete w/u in 2009    extensive coronary calcification by EBCT, normal myoview, echo with EF 70% mild AI  . Hyperlipidemia   . Aortic insufficiency     echo July 2011 EF 60%  . 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 History of GI bleed 2003    due to pyloric-positive ulcer   . Diverticulosis     with remote history of diverticulitis  . Plantar fasciitis     chronic  . Allergic rhinitis     responding well to  desensitation injections  .  Glaucoma   . Osteoarthritis     both knees  . Asthma     Past Surgical History  Procedure Laterality Date  . Tonsillectomy    . Hemorrhoid surgery  1996  . Hernia repair  1996    right  . Lumbar laminectomy  2003  . Cataract extraction      left eye    Family History  Problem Relation Age of Onset  . Thrombosis Father     mesenteric artery thrombosis after prostate surgery  . Colon cancer Mother     Social History:  reports that he has never smoked. He does not have any smokeless tobacco history on file. He reports that he does not drink alcohol or use illicit drugs.  Review of Systems -   He has had some urgency of urination but no incontinence  Has had osteoarthritis in multiple joints especially knees, using a walker  He does not think he has significant memory problems   Has history of reflux and taking Protonix  History of increased PSA, probable prostate cancer, his last PSA had been just above normal, has not followed up with urologist  Lab Results  Component Value Date   PSA 4.32* 07/19/2013   Gynecomastia has been present for some time and also associated with relatively low testosterone level, testosterone supplements none have not been used because of his history of high PSA and age  Lab Results  Component Value Date   TESTOSTERONE 190.7* 12/21/2013    EXAM:  BP 136/68 mmHg  Pulse 63  Temp(Src) 97.7 F (36.5 C)  Resp 16  Ht 5\' 9"  (1.753 m)  Wt 180 lb 12.8 oz (82.01 kg)  BMI 26.69 kg/m2  SpO2 96%  Standing blood pressure 108/62  Looks well   No thyroid enlargement   No significant related edema present on either leg, has trace edema on the right ankle  Has prominence of the calf on the right side on lateral part  Assessment/Plan:   Orthostatic hypotension: His blood pressure is still relatively low standing up although he is asymptomatic This is despite stopping Lasix Will also stop his amiloride    EDEMA: This is  related to venous insufficiency but appears to be controlled now even with stopping Lasix Since his blood pressure is low standing up he will have to stop amiloride as above May consider restarting this every other day if he has significant bilateral swelling He has been recommended last take stockings and he is variably compliant with this  Mild hypothyroidism: He has been on 25 g levothyroxine because of TSH of 8 and potential effect of hypothyroidism on memory TSH was back in the normal range on the last visit and will continue the same dose  Persistent anemia and thrombocytopenia of unclear etiology : CBC to be checked on the next visit Most likely anemia eyes related to chronic disease and other factors such as hypogonadism Since he has relatively low platelets he will stop his ASPIRIN    Pearle Wandler

## 2015-05-09 NOTE — Patient Instructions (Signed)
Stop Amiloride and if swelling occurs take every 2 days  Stop Aspirin

## 2015-06-03 ENCOUNTER — Telehealth: Payer: Self-pay | Admitting: Endocrinology

## 2015-06-03 NOTE — Telephone Encounter (Signed)
Patient's caretaker Victor Green called with some concern regarding Mr. Victor Green   Swelling of the legs  Lower back hip resembles of shingles rash   Please advise Victor Green   Call back: [phone]   Thank you

## 2015-06-03 NOTE — Telephone Encounter (Signed)
I spoke with Victor Green who said his legs have been swelling since stopping his fluid pill.   She said she asked him if he was in pain from the rash and he said no, but she states he has a high tolerance for pain. Please advise.

## 2015-06-03 NOTE — Telephone Encounter (Signed)
If the rash has been only for 2 days or less would like to see him in the office for possible antiviral treatment. He can restart  amiloride every other day

## 2015-06-03 NOTE — Telephone Encounter (Signed)
Noted, caregiver is aware,  She said the rash has been there for several days already, patient not complaining of pain and there are no blisters.

## 2015-06-14 ENCOUNTER — Other Ambulatory Visit (HOSPITAL_COMMUNITY): Payer: Self-pay | Admitting: Sports Medicine

## 2015-06-14 ENCOUNTER — Ambulatory Visit (HOSPITAL_COMMUNITY)
Admission: RE | Admit: 2015-06-14 | Discharge: 2015-06-14 | Disposition: A | Discharge status: Home / Self Care | Payer: 59 | Source: Ambulatory Visit | Attending: Cardiovascular Disease | Admitting: Cardiovascular Disease

## 2015-06-14 DIAGNOSIS — M7989 Other specified soft tissue disorders: Secondary | ICD-10-CM | POA: Insufficient documentation

## 2015-06-14 DIAGNOSIS — M79604 Pain in right leg: Secondary | ICD-10-CM | POA: Insufficient documentation

## 2015-06-14 DIAGNOSIS — M79601 Pain in right arm: Secondary | ICD-10-CM

## 2015-06-30 ENCOUNTER — Other Ambulatory Visit: Payer: Self-pay | Admitting: Endocrinology

## 2015-07-03 ENCOUNTER — Other Ambulatory Visit: Payer: Self-pay | Admitting: Endocrinology

## 2015-07-08 ENCOUNTER — Other Ambulatory Visit: Payer: Self-pay | Admitting: Endocrinology

## 2015-07-17 ENCOUNTER — Ambulatory Visit (INDEPENDENT_AMBULATORY_CARE_PROVIDER_SITE_OTHER): Payer: 59 | Admitting: Endocrinology

## 2015-07-17 ENCOUNTER — Encounter: Payer: Self-pay | Admitting: Endocrinology

## 2015-07-17 VITALS — BP 126/56 | HR 90 | Temp 98.3°F | Resp 16 | Ht 69.0 in | Wt 194.6 lb

## 2015-07-17 DIAGNOSIS — D539 Nutritional anemia, unspecified: Secondary | ICD-10-CM

## 2015-07-17 DIAGNOSIS — R609 Edema, unspecified: Secondary | ICD-10-CM

## 2015-07-17 DIAGNOSIS — D696 Thrombocytopenia, unspecified: Secondary | ICD-10-CM

## 2015-07-17 DIAGNOSIS — E039 Hypothyroidism, unspecified: Secondary | ICD-10-CM | POA: Diagnosis not present

## 2015-07-17 DIAGNOSIS — I951 Orthostatic hypotension: Secondary | ICD-10-CM

## 2015-07-17 DIAGNOSIS — E559 Vitamin D deficiency, unspecified: Secondary | ICD-10-CM | POA: Diagnosis not present

## 2015-07-17 DIAGNOSIS — E7800 Pure hypercholesterolemia, unspecified: Secondary | ICD-10-CM

## 2015-07-17 DIAGNOSIS — Z23 Encounter for immunization: Secondary | ICD-10-CM

## 2015-07-17 LAB — CBC WITH DIFFERENTIAL/PLATELET
BASOS ABS: 0 10*3/uL (ref 0.0–0.1)
BASOS PCT: 0.3 % (ref 0.0–3.0)
Eosinophils Absolute: 0.1 10*3/uL (ref 0.0–0.7)
Eosinophils Relative: 1.1 % (ref 0.0–5.0)
HCT: 32.7 % — ABNORMAL LOW (ref 39.0–52.0)
Hemoglobin: 10.8 g/dL — ABNORMAL LOW (ref 13.0–17.0)
LYMPHS ABS: 2.2 10*3/uL (ref 0.7–4.0)
Lymphocytes Relative: 30.5 % (ref 12.0–46.0)
MCHC: 33 g/dL (ref 30.0–36.0)
MCV: 98.5 fl (ref 78.0–100.0)
MONOS PCT: 6.8 % (ref 3.0–12.0)
Monocytes Absolute: 0.5 10*3/uL (ref 0.1–1.0)
NEUTROS ABS: 4.4 10*3/uL (ref 1.4–7.7)
NEUTROS PCT: 61.3 % (ref 43.0–77.0)
PLATELETS: 78 10*3/uL — AB (ref 150.0–400.0)
RBC: 3.32 Mil/uL — ABNORMAL LOW (ref 4.22–5.81)
RDW: 17.7 % — AB (ref 11.5–15.5)
WBC: 7.1 10*3/uL (ref 4.0–10.5)

## 2015-07-17 LAB — LIPID PANEL
CHOL/HDL RATIO: 2
Cholesterol: 111 mg/dL (ref 0–200)
HDL: 51.7 mg/dL (ref >39.00)
LDL CALC: 47 mg/dL (ref 0–99)
NonHDL: 59.55
TRIGLYCERIDES: 65 mg/dL (ref 0.0–149.0)
VLDL: 13 mg/dL (ref 0.0–40.0)

## 2015-07-17 LAB — BASIC METABOLIC PANEL
BUN: 34 mg/dL — ABNORMAL HIGH (ref 6–23)
CALCIUM: 9 mg/dL (ref 8.4–10.5)
CO2: 29 meq/L (ref 19–32)
CREATININE: 0.93 mg/dL (ref 0.40–1.50)
Chloride: 106 mEq/L (ref 96–112)
GFR: 80.54 mL/min (ref >60.00)
GLUCOSE: 101 mg/dL — AB (ref 70–99)
Potassium: 4.5 mEq/L (ref 3.5–5.1)
Sodium: 140 mEq/L (ref 135–145)

## 2015-07-17 LAB — COMPREHENSIVE METABOLIC PANEL
ALT: 23 U/L (ref 0–53)
AST: 29 U/L (ref 0–37)
Albumin: 3.8 g/dL (ref 3.5–5.2)
Alkaline Phosphatase: 70 U/L (ref 39–117)
BILIRUBIN TOTAL: 0.5 mg/dL (ref 0.2–1.2)
BUN: 34 mg/dL — ABNORMAL HIGH (ref 6–23)
CALCIUM: 9 mg/dL (ref 8.4–10.5)
CHLORIDE: 106 meq/L (ref 96–112)
CO2: 29 meq/L (ref 19–32)
Creatinine, Ser: 0.93 mg/dL (ref 0.40–1.50)
GFR: 80.54 mL/min (ref >60.00)
Glucose, Bld: 101 mg/dL — ABNORMAL HIGH (ref 70–99)
Potassium: 4.5 mEq/L (ref 3.5–5.1)
Sodium: 140 mEq/L (ref 135–145)
Total Protein: 6.6 g/dL (ref 6.0–8.3)

## 2015-07-17 LAB — VITAMIN D 25 HYDROXY (VIT D DEFICIENCY, FRACTURES): VITD: 33.04 ng/mL (ref 30.00–100.00)

## 2015-07-17 LAB — T4, FREE: Free T4: 0.96 ng/dL (ref 0.60–1.60)

## 2015-07-17 LAB — TSH: TSH: 6.19 u[IU]/mL — ABNORMAL HIGH (ref 0.35–4.50)

## 2015-07-17 MED ORDER — METOLAZONE 2.5 MG PO TABS: ORAL_TABLET | ORAL | Status: DC

## 2015-07-17 NOTE — Progress Notes (Signed)
Patient ID: Victor Green, male   DOB: August 07, 1920, 81 y.o.   MRN: [id number]   Chief complaint: Review of multiple problems in leg swelling  History of Present Illness:   1.    Leg edema: he has had lower leg edema which has been persistent for a couple of years  Etiology is unclear as he has had no systemic disease or renal insufficiency. It has been felt to be  from venous insufficiency   More recently he has had increasing edema for at least a month or so.  Previously was told to stop amiloride because of low normal blood pressure but he is still taking this now over the last month  He has not had any relief of edema from amiloride and in the last week has taken a half tablet Lasix without benefit He is not  using elastic stockings because of practical difficulties putting them on.  his weight has gone up significantly also  Wt Readings from Last 3 Encounters:  07/17/15 194 lb 9.6 oz (88.27 kg)  05/09/15 180 lb 12.8 oz (82.01 kg)  03/18/15 181 lb 3.2 oz (82.192 kg)     2. History of hypertension: Previously  had hypertension with mild hyperaldosteronism but has not had any hypokalemia.  Currently not requiring any antihypertensive medications but is still taking 5 mg amiloride, more for edema.  Home blood pressures have been generally higher than in the office but did not bring his record  3. Anemia: Hemoglobin has ranged from 10.8 -11.6. His last iron level was normal.  Ferritin previously low normal at 38 and B12 normal and he is taking iron.  Hematology evaluation was negative and no further follow-up recommended  However he continues to have persistent thrombocytopenia of unclear etiology, no bleeding tendencies He was told to stop aspirin   Lab Results  Component Value Date   WBC 6.9 03/18/2015   HGB 11.0* 03/18/2015   HCT 33.1* 03/18/2015   MCV 98.4 03/18/2015   PLT 72.0 Repeated and verified X2.* 03/18/2015    4.. Hypercholesterolemia: Mild and  well-controlled with low dose Lipitor, tends to have low HDL.   Lab Results  Component Value Date   CHOL 106 03/18/2015   HDL 39.80 03/18/2015   LDLCALC 45 03/18/2015   TRIG 106.0 03/18/2015   CHOLHDL 3 03/18/2015     5.   Hypothyroidism.  He has been on low-dose supplementation with 25 g again since his TSH had gone up to 8.1 in 2/16 with stopping his thyroid supplement in 9/15 He does not feel any different with starting the thyroid supplement including any changes in concentration or memory   Lab Results  Component Value Date   TSH 3.82 03/18/2015   TSH 8.06* 12/05/2014   TSH 0.54 07/04/2014   FREET4 0.79 03/18/2015   FREET4 0.95 12/05/2014   FREET4 1.12 07/04/2014           Medication List       This list is accurate as of: 07/17/15  1:14 PM.  Always use your most recent med list.               acetaminophen 325 MG suppository  Commonly known as:  TYLENOL  Place 325 mg rectally every 4 (four) hours as needed.     albuterol 108 (90 BASE) MCG/ACT inhaler  Commonly known as:  PROVENTIL HFA;VENTOLIN HFA  Inhale 2 puffs into the lungs every 4 (four) hours as needed for wheezing or shortness  of breath.     aMILoride 5 MG tablet  Commonly known as:  MIDAMOR  Take 1 tablet by mouth  daily     atorvastatin 10 MG tablet  Commonly known as:  LIPITOR  Take 1 tablet by mouth  daily     azelastine 0.1 % nasal spray  Commonly known as:  ASTELIN  Place 1 spray into both nostrils 2 (two) times daily. Use in each nostril as directed     B-COMPLEX PO  Take by mouth.     brimonidine 0.15 % ophthalmic solution  Commonly known as:  ALPHAGAN  Place 1 drop into both eyes 2 (two) times daily.     ALPHAGAN P 0.1 % Soln  Generic drug:  brimonidine     dextromethorphan-guaiFENesin 30-600 MG per 12 hr tablet  Commonly known as:  MUCINEX DM  Take 1 tablet by mouth daily. Takes 1/2 tablet daily     diazepam 2 MG tablet  Commonly known as:  VALIUM  Take 1 tablet (2 mg  total) by mouth daily.     diclofenac sodium 1 % Gel  Commonly known as:  VOLTAREN  Apply 4 g topically 4 (four) times daily.     dorzolamide 2 % ophthalmic solution  Commonly known as:  TRUSOPT  1 drop as directed.     dutasteride 0.5 MG capsule  Commonly known as:  AVODART  Take 1 capsule by mouth  daily     EPIPEN 2-PAK 0.3 mg/0.3 mL Soaj injection  Generic drug:  EPINEPHrine  Inject 0.71ml (0.3mg ) into  the muscle once as directed     fexofenadine 180 MG tablet  Commonly known as:  ALLEGRA  Take 180 mg by mouth daily. 1/2 tablet once a day     fluticasone 50 MCG/ACT nasal spray  Commonly known as:  FLONASE  Use 2 sprays in each  nostril twice daily     FLUZONE HIGH-DOSE 0.5 ML Susy  Generic drug:  Influenza Vac Split High-Dose     furosemide 40 MG tablet  Commonly known as:  LASIX     glucosamine-chondroitin 500-400 MG tablet  Take 1 tablet by mouth 3 (three) times daily.     Iron (Ferrous Gluconate) 256 (28 FE) MG Tabs  Take by mouth.     levothyroxine 50 MCG tablet  Commonly known as:  SYNTHROID, LEVOTHROID  Take 1 tablet by mouth  daily     MIRALAX powder  Generic drug:  polyethylene glycol powder  Take 17 g by mouth as needed.     multivitamin capsule  Take 1 capsule by mouth daily.     pantoprazole 40 MG tablet  Commonly known as:  PROTONIX  Take 1 tablet by mouth  daily        Allergies:  Allergies  Allergen Reactions  . Tetanus Toxoids     Past Medical History  Diagnosis Date  . Hypertension   . CAD (coronary artery disease) complete w/u in 2009    extensive coronary calcification by EBCT, normal myoview, echo with EF 70% mild AI  . Hyperlipidemia   . Aortic insufficiency     echo July 2011 EF 60%  . 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 History of GI bleed 2003    due to pyloric-positive ulcer   . Diverticulosis     with remote history of diverticulitis  . Plantar fasciitis     chronic  . Allergic rhinitis  responding well to desensitation injections  . Glaucoma   . Osteoarthritis     both knees  . Asthma     Past Surgical History  Procedure Laterality Date  . Tonsillectomy    . Hemorrhoid surgery  1996  . Hernia repair  1996    right  . Lumbar laminectomy  2003  . Cataract extraction      left eye    Family History  Problem Relation Age of Onset  . Thrombosis Father     mesenteric artery thrombosis after prostate surgery  . Colon cancer Mother     Social History:  reports that he has never smoked. He does not have any smokeless tobacco history on file. He reports that he does not drink alcohol or use illicit drugs.  Review of Systems -    Has history of reflux ,  He is taking Protonix with control of symptoms  History of increased PSA, probable prostate cancer, his last PSA had been just above normal  Lab Results  Component Value Date   PSA 4.32* 07/19/2013   Gynecomastia has been present for some time and also associated with relatively low testosterone level, testosterone supplements none have not been used because of his history of high PSA and age  Lab Results  Component Value Date   TESTOSTERONE 190.7* 12/21/2013    EXAM:  BP 126/56 mmHg  Pulse 90  Temp(Src) 98.3 F (36.8 C)  Resp 16  Ht 5\' 9"  (1.753 m)  Wt 194 lb 9.6 oz (88.27 kg)  BMI 28.72 kg/m2  SpO2 91%  Standing blood pressure  135/70  3+ lower leg and pedal edema present  Has diffuse mild erythema on the lower legs  Near the ankles without tenderness and mild warmth.  no calf tenderness.  Second toe on the right distal he has a shallow clean ulcer without purulent discharge or surrounding redness   heart sounds normal, no murmur Lungs clear  No jugular venous distention per No hepatosplenomegaly present  Assessment/Plan:  EDEMA: This is  related to venous insufficiency but appears to be much worse for no apparent reason    He previously had difficulty getting edema under control and needed  Zaroxolyn also; because of his weight gain he is likely to be retaining a lot of fluid and will need evaluation for hypoalbuminemia  Since his blood pressure is normal will be able to increase his diuretics and also to use Zaroxolyn temporarily  He will also need to have follow-up electrolytes done  He will stop amiloride as this is not benefiting  Clinically no evidence of DVT   TOE ulcer : This is minimal and likely to be related to vibration and his leg swelling. He can apply topical Neosporin and covered with a Band-Aid. He will call if he has any redness or purulent discharge  Mild hypothyroidism: He has been on 25 g levothyroxine because of TSH of 8 and potential effect of hypothyroidism on memory TSH  To be rechecked today  Persistent anemia and thrombocytopenia of unclear etiology : CBC to be checked  Most likely anemia eyes related to chronic disease and other factors such as hypogonadism  Patient Instructions  Furosemide 1 and 1/2 daily in am for 1 week then 1 daily  Stop amiloride  Metolazone 30 min before lasix every 2 days for a week then 3 days          Adriauna Campton

## 2015-07-17 NOTE — Patient Instructions (Addendum)
Furosemide 1 and 1/2 daily in am for 1 week then 1 daily  Stop amiloride  Metolazone 30 min before lasix every 2 days for a week then 3 days

## 2015-07-21 NOTE — Progress Notes (Signed)
Quick Note:  Please let patient know that the lab results are in a fairly good and stable range  ______

## 2015-08-07 ENCOUNTER — Telehealth: Payer: Self-pay | Admitting: Endocrinology

## 2015-08-07 NOTE — Telephone Encounter (Signed)
Victor Green from [facility] calling because the son has requested them go out once a week they will be sending orders, it is to check for DVT once weekly # [phone]

## 2015-08-08 ENCOUNTER — Ambulatory Visit: Payer: 59 | Admitting: Endocrinology

## 2015-08-08 ENCOUNTER — Inpatient Hospital Stay (HOSPITAL_COMMUNITY): Payer: 59

## 2015-08-08 ENCOUNTER — Observation Stay (HOSPITAL_COMMUNITY): Payer: 59

## 2015-08-08 ENCOUNTER — Inpatient Hospital Stay (HOSPITAL_COMMUNITY)
Admission: EM | Admit: 2015-08-08 | Discharge: 2015-08-10 | DRG: 682 | Disposition: A | Discharge status: Home / Self Care | Payer: 59 | Attending: Internal Medicine | Admitting: Internal Medicine

## 2015-08-08 ENCOUNTER — Encounter (HOSPITAL_COMMUNITY): Payer: Self-pay | Admitting: *Deleted

## 2015-08-08 DIAGNOSIS — Z66 Do not resuscitate: Secondary | ICD-10-CM | POA: Diagnosis present

## 2015-08-08 DIAGNOSIS — R0902 Hypoxemia: Secondary | ICD-10-CM

## 2015-08-08 DIAGNOSIS — M1990 Unspecified osteoarthritis, unspecified site: Secondary | ICD-10-CM | POA: Diagnosis present

## 2015-08-08 DIAGNOSIS — Z79899 Other long term (current) drug therapy: Secondary | ICD-10-CM | POA: Diagnosis not present

## 2015-08-08 DIAGNOSIS — H409 Unspecified glaucoma: Secondary | ICD-10-CM | POA: Diagnosis present

## 2015-08-08 DIAGNOSIS — R627 Adult failure to thrive: Secondary | ICD-10-CM | POA: Diagnosis present

## 2015-08-08 DIAGNOSIS — I471 Supraventricular tachycardia: Secondary | ICD-10-CM | POA: Diagnosis present

## 2015-08-08 DIAGNOSIS — I4510 Unspecified right bundle-branch block: Secondary | ICD-10-CM | POA: Diagnosis present

## 2015-08-08 DIAGNOSIS — E876 Hypokalemia: Secondary | ICD-10-CM

## 2015-08-08 DIAGNOSIS — W19XXXA Unspecified fall, initial encounter: Secondary | ICD-10-CM

## 2015-08-08 DIAGNOSIS — I2510 Atherosclerotic heart disease of native coronary artery without angina pectoris: Secondary | ICD-10-CM | POA: Diagnosis present

## 2015-08-08 DIAGNOSIS — Z9842 Cataract extraction status, left eye: Secondary | ICD-10-CM

## 2015-08-08 DIAGNOSIS — N179 Acute kidney failure, unspecified: Principal | ICD-10-CM | POA: Diagnosis present

## 2015-08-08 DIAGNOSIS — R55 Syncope and collapse: Secondary | ICD-10-CM | POA: Diagnosis present

## 2015-08-08 DIAGNOSIS — R296 Repeated falls: Secondary | ICD-10-CM

## 2015-08-08 DIAGNOSIS — E039 Hypothyroidism, unspecified: Secondary | ICD-10-CM | POA: Diagnosis present

## 2015-08-08 DIAGNOSIS — Z9181 History of falling: Secondary | ICD-10-CM

## 2015-08-08 DIAGNOSIS — I10 Essential (primary) hypertension: Secondary | ICD-10-CM | POA: Diagnosis present

## 2015-08-08 DIAGNOSIS — J45909 Unspecified asthma, uncomplicated: Secondary | ICD-10-CM | POA: Diagnosis present

## 2015-08-08 DIAGNOSIS — E860 Dehydration: Secondary | ICD-10-CM | POA: Diagnosis present

## 2015-08-08 DIAGNOSIS — F0390 Unspecified dementia without behavioral disturbance: Secondary | ICD-10-CM | POA: Diagnosis present

## 2015-08-08 DIAGNOSIS — I951 Orthostatic hypotension: Secondary | ICD-10-CM | POA: Diagnosis not present

## 2015-08-08 DIAGNOSIS — I453 Trifascicular block: Secondary | ICD-10-CM | POA: Diagnosis present

## 2015-08-08 DIAGNOSIS — I351 Nonrheumatic aortic (valve) insufficiency: Secondary | ICD-10-CM | POA: Diagnosis present

## 2015-08-08 DIAGNOSIS — E785 Hyperlipidemia, unspecified: Secondary | ICD-10-CM | POA: Diagnosis present

## 2015-08-08 DIAGNOSIS — R7989 Other specified abnormal findings of blood chemistry: Secondary | ICD-10-CM

## 2015-08-08 DIAGNOSIS — G9341 Metabolic encephalopathy: Secondary | ICD-10-CM | POA: Diagnosis present

## 2015-08-08 DIAGNOSIS — R945 Abnormal results of liver function studies: Secondary | ICD-10-CM

## 2015-08-08 LAB — COMPREHENSIVE METABOLIC PANEL
ALK PHOS: 63 U/L (ref 38–126)
ALT: 27 U/L (ref 17–63)
AST: 56 U/L — AB (ref 15–41)
Albumin: 3.7 g/dL (ref 3.5–5.0)
Anion gap: 19 — ABNORMAL HIGH (ref 5–15)
BUN: 86 mg/dL — AB (ref 6–20)
CALCIUM: 9.8 mg/dL (ref 8.9–10.3)
CO2: 44 mmol/L — AB (ref 22–32)
CREATININE: 1.79 mg/dL — AB (ref 0.61–1.24)
Chloride: 78 mmol/L — ABNORMAL LOW (ref 101–111)
GFR, EST AFRICAN AMERICAN: 36 mL/min — AB (ref >60)
GFR, EST NON AFRICAN AMERICAN: 31 mL/min — AB (ref >60)
Glucose, Bld: 199 mg/dL — ABNORMAL HIGH (ref 65–99)
Potassium: 2 mmol/L — CL (ref 3.5–5.1)
Sodium: 141 mmol/L (ref 135–145)
Total Bilirubin: 2 mg/dL — ABNORMAL HIGH (ref 0.3–1.2)
Total Protein: 5.7 g/dL — ABNORMAL LOW (ref 6.5–8.1)

## 2015-08-08 LAB — CBC WITH DIFFERENTIAL/PLATELET
Basophils Absolute: 0 10*3/uL (ref 0.0–0.1)
Basophils Relative: 0 %
Eosinophils Absolute: 0 10*3/uL (ref 0.0–0.7)
Eosinophils Relative: 0 %
HCT: 37.6 % — ABNORMAL LOW (ref 39.0–52.0)
HEMOGLOBIN: 12.1 g/dL — AB (ref 13.0–17.0)
LYMPHS ABS: 1.1 10*3/uL (ref 0.7–4.0)
LYMPHS PCT: 10 %
MCH: 32.1 pg (ref 26.0–34.0)
MCHC: 32.2 g/dL (ref 30.0–36.0)
MCV: 99.7 fL (ref 78.0–100.0)
Monocytes Absolute: 1.1 10*3/uL — ABNORMAL HIGH (ref 0.1–1.0)
Monocytes Relative: 10 %
NEUTROS ABS: 8.2 10*3/uL — AB (ref 1.7–7.7)
NEUTROS PCT: 80 %
Platelets: 100 10*3/uL — ABNORMAL LOW (ref 150–400)
RBC: 3.77 MIL/uL — AB (ref 4.22–5.81)
RDW: 17 % — ABNORMAL HIGH (ref 11.5–15.5)
WBC: 10.4 10*3/uL (ref 4.0–10.5)

## 2015-08-08 LAB — BASIC METABOLIC PANEL
ANION GAP: 15 (ref 5–15)
BUN: 75 mg/dL — ABNORMAL HIGH (ref 6–20)
CALCIUM: 8.7 mg/dL — AB (ref 8.9–10.3)
CO2: 43 mmol/L — AB (ref 22–32)
Chloride: 82 mmol/L — ABNORMAL LOW (ref 101–111)
Creatinine, Ser: 1.59 mg/dL — ABNORMAL HIGH (ref 0.61–1.24)
GFR calc non Af Amer: 36 mL/min — ABNORMAL LOW (ref >60)
GFR, EST AFRICAN AMERICAN: 41 mL/min — AB (ref >60)
Glucose, Bld: 157 mg/dL — ABNORMAL HIGH (ref 65–99)
Potassium: 2.6 mmol/L — CL (ref 3.5–5.1)
SODIUM: 140 mmol/L (ref 135–145)

## 2015-08-08 LAB — I-STAT CG4 LACTIC ACID, ED: LACTIC ACID, VENOUS: 4.81 mmol/L — AB (ref 0.5–2.0)

## 2015-08-08 LAB — URINALYSIS, ROUTINE W REFLEX MICROSCOPIC
BILIRUBIN URINE: NEGATIVE
GLUCOSE, UA: NEGATIVE mg/dL
Ketones, ur: NEGATIVE mg/dL
Leukocytes, UA: NEGATIVE
Nitrite: NEGATIVE
Protein, ur: NEGATIVE mg/dL
SPECIFIC GRAVITY, URINE: 1.01 (ref 1.005–1.030)
UROBILINOGEN UA: 0.2 mg/dL (ref 0.0–1.0)
pH: 5.5 (ref 5.0–8.0)

## 2015-08-08 LAB — URINE MICROSCOPIC-ADD ON

## 2015-08-08 LAB — CBC
HEMATOCRIT: 33.7 % — AB (ref 39.0–52.0)
HEMOGLOBIN: 11.1 g/dL — AB (ref 13.0–17.0)
MCH: 32.5 pg (ref 26.0–34.0)
MCHC: 32.9 g/dL (ref 30.0–36.0)
MCV: 98.5 fL (ref 78.0–100.0)
Platelets: 108 10*3/uL — ABNORMAL LOW (ref 150–400)
RBC: 3.42 MIL/uL — ABNORMAL LOW (ref 4.22–5.81)
RDW: 17.2 % — AB (ref 11.5–15.5)
WBC: 8.6 10*3/uL (ref 4.0–10.5)

## 2015-08-08 LAB — MAGNESIUM: MAGNESIUM: 2.1 mg/dL (ref 1.7–2.4)

## 2015-08-08 LAB — TROPONIN I: Troponin I: 0.05 ng/mL — ABNORMAL HIGH (ref <0.031)

## 2015-08-08 MED ORDER — SODIUM CHLORIDE 0.9 % IV BOLUS (SEPSIS)
2000.0000 mL | Freq: Once | INTRAVENOUS | Status: AC
  Administered 2015-08-08: 2000 mL via INTRAVENOUS

## 2015-08-08 MED ORDER — LORATADINE 10 MG PO TABS
5.0000 mg | ORAL_TABLET | Freq: Every day | ORAL | Status: DC
  Administered 2015-08-09 – 2015-08-10 (×3): 5 mg via ORAL
  Filled 2015-08-08 (×3): qty 1

## 2015-08-08 MED ORDER — LEVOTHYROXINE SODIUM 50 MCG PO TABS
50.0000 ug | ORAL_TABLET | Freq: Every day | ORAL | Status: DC
  Administered 2015-08-09: 50 ug via ORAL
  Filled 2015-08-08: qty 1

## 2015-08-08 MED ORDER — POTASSIUM CHLORIDE 10 MEQ/100ML IV SOLN
10.0000 meq | Freq: Once | INTRAVENOUS | Status: AC
  Administered 2015-08-08 – 2015-08-10 (×2): 10 meq via INTRAVENOUS
  Filled 2015-08-08: qty 100

## 2015-08-08 MED ORDER — POTASSIUM CHLORIDE CRYS ER 20 MEQ PO TBCR
40.0000 meq | EXTENDED_RELEASE_TABLET | Freq: Once | ORAL | Status: AC
  Administered 2015-08-08: 40 meq via ORAL
  Filled 2015-08-08: qty 2

## 2015-08-08 MED ORDER — BRIMONIDINE TARTRATE 0.2 % OP SOLN
1.0000 [drp] | Freq: Two times a day (BID) | OPHTHALMIC | Status: DC
  Administered 2015-08-09 – 2015-08-10 (×4): 1 [drp] via OPHTHALMIC
  Filled 2015-08-08: qty 5

## 2015-08-08 MED ORDER — ENOXAPARIN SODIUM 30 MG/0.3ML ~~LOC~~ SOLN
30.0000 mg | SUBCUTANEOUS | Status: DC
  Administered 2015-08-09: 30 mg via SUBCUTANEOUS
  Filled 2015-08-08 (×2): qty 0.3

## 2015-08-08 MED ORDER — POTASSIUM CHLORIDE IN NACL 20-0.9 MEQ/L-% IV SOLN
INTRAVENOUS | Status: AC
  Administered 2015-08-08 – 2015-08-09 (×2): via INTRAVENOUS
  Filled 2015-08-08 (×3): qty 1000

## 2015-08-08 MED ORDER — BRIMONIDINE TARTRATE 0.15 % OP SOLN
1.0000 [drp] | Freq: Two times a day (BID) | OPHTHALMIC | Status: DC
  Filled 2015-08-08: qty 5

## 2015-08-08 MED ORDER — DUTASTERIDE 0.5 MG PO CAPS
0.5000 mg | ORAL_CAPSULE | Freq: Every day | ORAL | Status: DC
  Administered 2015-08-09 – 2015-08-10 (×3): 0.5 mg via ORAL
  Filled 2015-08-08 (×3): qty 1

## 2015-08-08 MED ORDER — DORZOLAMIDE HCL 2 % OP SOLN
1.0000 [drp] | Freq: Two times a day (BID) | OPHTHALMIC | Status: DC
  Administered 2015-08-09 – 2015-08-10 (×4): 1 [drp] via OPHTHALMIC
  Filled 2015-08-08: qty 10

## 2015-08-08 MED ORDER — PANTOPRAZOLE SODIUM 40 MG PO TBEC
40.0000 mg | DELAYED_RELEASE_TABLET | Freq: Every day | ORAL | Status: DC
  Administered 2015-08-09 – 2015-08-10 (×2): 40 mg via ORAL
  Filled 2015-08-08 (×2): qty 1

## 2015-08-08 MED ORDER — ATORVASTATIN CALCIUM 10 MG PO TABS
10.0000 mg | ORAL_TABLET | Freq: Every day | ORAL | Status: DC
  Administered 2015-08-09: 10 mg via ORAL
  Filled 2015-08-08: qty 1

## 2015-08-08 MED ORDER — SODIUM CHLORIDE 0.9 % IV SOLN
INTRAVENOUS | Status: DC
  Administered 2015-08-08: 15:00:00 via INTRAVENOUS

## 2015-08-08 MED ORDER — DIAZEPAM 2 MG PO TABS
2.0000 mg | ORAL_TABLET | Freq: Every day | ORAL | Status: DC
  Administered 2015-08-09 (×2): 2 mg via ORAL
  Filled 2015-08-08 (×2): qty 1

## 2015-08-08 MED ORDER — SODIUM CHLORIDE 0.9 % IJ SOLN
3.0000 mL | Freq: Two times a day (BID) | INTRAMUSCULAR | Status: DC
Start: 2015-08-08 — End: 2015-08-10
  Administered 2015-08-09 (×3): 3 mL via INTRAVENOUS

## 2015-08-08 NOTE — H&P (Addendum)
History and Physical  Victor Green WNU:[id number] DOB: 1920/07/04 DOA: 08/08/2015  Referring physician: EDP PCP: [name], MD   Chief Complaint:  Falls , syncope/presyncope  HPI: Victor Green is a 81 y.o. male  With has been treated for lower extremity edema with increase dose of diuretics fell twice today on the floor in the bathroom, the third time, sone caught him and layed him down on the floor and called 911, per EMS patient was orthostatic on arrival, patient was given 300cc ivf enroute to the ED.  ED course: he was orthostatic, ua no infection, labs showed arf/hypiokalemia, he was given ivf, potassium supplement, hospitalist called to admit the patient. Patient currently only oriented to person, not able to provide detailed history, but denies pain, no sob, no fever, denies diarrhea. Reported lower extremity has much improved compare to before.   Review of Systems:  Detail per HPI, Review of systems are otherwise negative  Past Medical History  Diagnosis Date  . Hypertension   . CAD (coronary artery disease) complete w/u in 2009    extensive coronary calcification by EBCT, normal myoview, echo with EF 70% mild AI  . Hyperlipidemia   . Aortic insufficiency     echo July 2011 EF 60%  . 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 History of GI bleed 2003    due to pyloric-positive ulcer   . Diverticulosis     with remote history of diverticulitis  . Plantar fasciitis     chronic  . Allergic rhinitis     responding well to desensitation injections  . Glaucoma   . Osteoarthritis     both knees  . Asthma    Past Surgical History  Procedure Laterality Date  . Tonsillectomy    . Hemorrhoid surgery  1996  . Hernia repair  1996    right  . Lumbar laminectomy  2003  . Cataract extraction      left eye   Social History:  reports that he has never smoked. He does not have any smokeless tobacco history on file. He reports that he does not drink alcohol or  use illicit drugs. Patient lives at home & is able to participate in activities of daily living with a walker and 24/7 help at home  Allergies  Allergen Reactions  . Tetanus Toxoids     Family History  Problem Relation Age of Onset  . Thrombosis Father     mesenteric artery thrombosis after prostate surgery  . Colon cancer Mother       Prior to Admission medications   Medication Sig Start Date End Date Taking? Authorizing Provider  acetaminophen (TYLENOL) 325 MG suppository Place 325 mg rectally every 4 (four) hours as needed.    Historical Provider, MD  albuterol (PROVENTIL HFA;VENTOLIN HFA) 108 (90 BASE) MCG/ACT inhaler Inhale 2 puffs into the lungs every 4 (four) hours as needed for wheezing or shortness of breath.    Historical Provider, MD  ALPHAGAN P 0.1 % SOLN  01/26/15   Historical Provider, MD  aMILoride (MIDAMOR) 5 MG tablet Take 1 tablet by mouth  daily 05/07/15   [name], MD  atorvastatin (LIPITOR) 10 MG tablet Take 1 tablet by mouth  daily 07/09/15   [name], MD  azelastine (ASTELIN) 0.1 % nasal spray Place 1 spray into both nostrils 2 (two) times daily. Use in each nostril as directed 08/20/14   [name], MD  B Complex-Biotin-FA (B-COMPLEX PO)  Take by mouth.    Historical Provider, MD  brimonidine (ALPHAGAN) 0.15 % ophthalmic solution Place 1 drop into both eyes 2 (two) times daily.      Historical Provider, MD  dextromethorphan-guaiFENesin (MUCINEX DM) 30-600 MG per 12 hr tablet Take 1 tablet by mouth daily. Takes 1/2 tablet daily    Historical Provider, MD  diazepam (VALIUM) 2 MG tablet Take 1 tablet (2 mg total) by mouth daily. 09/24/14   [name], MD  diclofenac sodium (VOLTAREN) 1 % GEL Apply 4 g topically 4 (four) times daily. 05/09/15   [name], MD  dorzolamide (TRUSOPT) 2 % ophthalmic solution 1 drop as directed.      Historical Provider, MD  dutasteride (AVODART) 0.5 MG capsule Take 1 capsule by mouth  daily 07/03/15   [name], MD  EPIPEN 2-PAK 0.3  MG/0.3ML SOAJ injection Inject 0.34ml (0.3mg ) into  the muscle once as directed 12/26/14   [name], MD  fexofenadine (ALLEGRA) 180 MG tablet Take 180 mg by mouth daily. 1/2 tablet once a day    Historical Provider, MD  fluticasone (FLONASE) 50 MCG/ACT nasal spray Use 2 sprays in each  nostril twice daily 08/20/14   [name], MD  FLUZONE HIGH-DOSE 0.5 ML SUSY  08/13/14   Historical Provider, MD  furosemide (LASIX) 40 MG tablet  06/01/15   Historical Provider, MD  glucosamine-chondroitin 500-400 MG tablet Take 1 tablet by mouth 3 (three) times daily.      Historical Provider, MD  Iron, Ferrous Gluconate, 256 (28 FE) MG TABS Take by mouth.    Historical Provider, MD  levothyroxine (SYNTHROID, LEVOTHROID) 50 MCG tablet Take 1 tablet by mouth  daily 07/01/15   [name], MD  metolazone (ZAROXOLYN) 2.5 MG tablet As directed 07/17/15   [name], MD  Multiple Vitamin (MULTIVITAMIN) capsule Take 1 capsule by mouth daily.      Historical Provider, MD  pantoprazole (PROTONIX) 40 MG tablet Take 1 tablet by mouth  daily 04/24/15   [name], MD  polyethylene glycol powder (MIRALAX) powder Take 17 g by mouth as needed.      Historical Provider, MD    Physical Exam: BP 129/62 mmHg  Pulse 87  Temp(Src) 98.8 F (37.1 C) (Oral)  Resp 20  Ht 5\' 9"  (1.753 m)  SpO2 94%  General:  Alert, pleasant and follow command, only oriented to person, not to time or place Eyes: PERRL ENT: unremarkable Neck: supple, no JVD Cardiovascular: RRR Respiratory: CTABL Abdomen: soft/ND/ND, positive bowel sounds Skin: no rash Musculoskeletal:  Trace edema right lower extremity, No edema left lower extremity Psychiatric: calm/cooperative Neurologic: no focal findings , Alert, pleasant and follow command, only oriented to person, not to time or place          Labs on Admission:  Basic Metabolic Panel:  Recent Labs Lab 08/08/15 1530  NA 141  K 2.0*  CL 78*  CO2 44*  GLUCOSE 199*  BUN 86*  CREATININE 1.79*    CALCIUM 9.8   Liver Function Tests:  Recent Labs Lab 08/08/15 1530  AST 56*  ALT 27  ALKPHOS 63  BILITOT 2.0*  PROT 5.7*  ALBUMIN 3.7   No results for input(s): LIPASE, AMYLASE in the last 168 hours. No results for input(s): AMMONIA in the last 168 hours. CBC:  Recent Labs Lab 08/08/15 1530  WBC 10.4  NEUTROABS 8.2*  HGB 12.1*  HCT 37.6*  MCV 99.7  PLT 100*   Cardiac Enzymes:  Recent Labs  Lab 08/08/15 1530  TROPONINI 0.05*    BNP (last 3 results) No results for input(s): BNP in the last 8760 hours.  ProBNP (last 3 results) No results for input(s): PROBNP in the last 8760 hours.  CBG: No results for input(s): GLUCAP in the last 168 hours.  Radiological Exams on Admission: No results found.  EKG: Independently reviewed. QTc prolongation, chronic RBBB   Assessment/Plan Present on Admission:  . Dehydration . ARF (acute renal failure) (HCC)  ARF, ua no infection, likely from dehydration, due to recent diuretic use. Will hold all diuretics.check renal ultrasound, Continue IVF ( ED ordered 2liter ns), will start ns with 20k at 125cc/hr for 24hrs, then reevaluate.  Hypokalemia, k 2.0 on admission, s/p oral k 57meq and iv k 72meq, repeat k/mag at 8pm, likely will need to replaced again.  QTc prolongation in the setting of hypok and background chronic RBBB. Repeat EKG in am to monitor QTc.  Syncope/Presyncope and orthostatic hypotension, likely secondary to dehydration, will put on tele , checkam cortisol level, echo and CT head.  H/o hypothyroidism: continue synthroid  H/o of HTN, not on any home meds except diuretics, now orthostatic hypotension on fluids. Hold diuretics.  Lower extremity edema: has mostly resolved, recent venous doppler in 8/ 2016 negative for DVT, but son reported that patient seems to have fluids in his abdomen. On pain on exam, Will check abdominal ultrasound.   FTT/falls, Likely baseline dementia, though no prior diagnosis. Per  family patient worked up to a year ago, but has gradual decline for a year, with poor short term memory, walks with a walker inside the house with 24/7 care (family hired) at home. Will need PT, likely will need home health.  DVT prophylaxis: lovenox 30mg  subQ  Consultants: none  Code Status: full , per son patient has established DNR, but paperwork is at home, patient's son agrees to put in full code for now, until he brings in the paperwork to verified DNR status.  Family Communication:  Patient and two sons in the ED  Disposition Plan: admit to med tele  Time spent: 57mins  Annaliza Zia MD, PhD [facility] Pager [phone] If 7PM-7AM, please contact night-coverage at [email or web address], password [facility]

## 2015-08-08 NOTE — ED Notes (Signed)
Pt was unable to stand long enough to get BP while standing, Pt stated he was too weak. Pt was moving the whole time while sitting up during the sitting Orthostatic BP.

## 2015-08-08 NOTE — Telephone Encounter (Signed)
Done and faxed

## 2015-08-08 NOTE — ED Notes (Signed)
Per son, Pt fell twice on the floor in the bathroom.  The third time he didn't fall,son caught him and layed him down on the floor and called 911.  He did pass out and was confused upon awakening.  Per EMS pt was orthostatic upon arrival.  Pt has a skin tear on his right forearm  And bruising on both arms.  VS per EMS are as follows: BP: 122/72 HR: 88 PT was given 300cc of normal saline in route.

## 2015-08-08 NOTE — ED Provider Notes (Signed)
CSN: [id number]     Arrival date & time 08/08/15  1442 History   First MD Initiated Contact with Patient 08/08/15 1443     Chief Complaint  Patient presents with  . Near Syncope     (Consider location/radiation/quality/duration/timing/severity/associated sxs/prior Treatment) HPI Comments: Patient here after having 3 episodes of syncope prior to arrival. No symptoms prior to the event. No recent medication changes. Denies any recent illnesses such as chest pain, shortness breath, abdominal pain, bloody stools. Symptoms were when he stood up as well as when he was sitting down. No seizure activity noted. Patient was noted to be orthostatic per EMS. Patient has had decreased oral intake recently. No recent fever reported. Patient given 300 mL of saline prior to arrival  Patient is a 81 y.o. male presenting with near-syncope. The history is provided by the patient and a relative.  Near Syncope    Past Medical History  Diagnosis Date  . Hypertension   . CAD (coronary artery disease) complete w/u in 2009    extensive coronary calcification by EBCT, normal myoview, echo with EF 70% mild AI  . Hyperlipidemia   . Aortic insufficiency     echo July 2011 EF 60%  . 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 History of GI bleed 2003    due to pyloric-positive ulcer   . Diverticulosis     with remote history of diverticulitis  . Plantar fasciitis     chronic  . Allergic rhinitis     responding well to desensitation injections  . Glaucoma   . Osteoarthritis     both knees  . Asthma    Past Surgical History  Procedure Laterality Date  . Tonsillectomy    . Hemorrhoid surgery  1996  . Hernia repair  1996    right  . Lumbar laminectomy  2003  . Cataract extraction      left eye   Family History  Problem Relation Age of Onset  . Thrombosis Father     mesenteric artery thrombosis after prostate surgery  . Colon cancer Mother    Social History  Substance Use Topics  .  Smoking status: Never Smoker   . Smokeless tobacco: None  . Alcohol Use: No    Review of Systems  Cardiovascular: Positive for near-syncope.  All other systems reviewed and are negative.     Allergies  Tetanus toxoids  Home Medications   Prior to Admission medications   Medication Sig Start Date End Date Taking? Authorizing Provider  acetaminophen (TYLENOL) 325 MG suppository Place 325 mg rectally every 4 (four) hours as needed.    Historical Provider, MD  albuterol (PROVENTIL HFA;VENTOLIN HFA) 108 (90 BASE) MCG/ACT inhaler Inhale 2 puffs into the lungs every 4 (four) hours as needed for wheezing or shortness of breath.    Historical Provider, MD  ALPHAGAN P 0.1 % SOLN  01/26/15   Historical Provider, MD  aMILoride (MIDAMOR) 5 MG tablet Take 1 tablet by mouth  daily 05/07/15   [name], MD  atorvastatin (LIPITOR) 10 MG tablet Take 1 tablet by mouth  daily 07/09/15   [name], MD  azelastine (ASTELIN) 0.1 % nasal spray Place 1 spray into both nostrils 2 (two) times daily. Use in each nostril as directed 08/20/14   [name], MD  B Complex-Biotin-FA (B-COMPLEX PO) Take by mouth.    Historical Provider, MD  brimonidine (ALPHAGAN) 0.15 % ophthalmic solution Place 1 drop into both  eyes 2 (two) times daily.      Historical Provider, MD  dextromethorphan-guaiFENesin (MUCINEX DM) 30-600 MG per 12 hr tablet Take 1 tablet by mouth daily. Takes 1/2 tablet daily    Historical Provider, MD  diazepam (VALIUM) 2 MG tablet Take 1 tablet (2 mg total) by mouth daily. 09/24/14   [name], MD  diclofenac sodium (VOLTAREN) 1 % GEL Apply 4 g topically 4 (four) times daily. 05/09/15   [name], MD  dorzolamide (TRUSOPT) 2 % ophthalmic solution 1 drop as directed.      Historical Provider, MD  dutasteride (AVODART) 0.5 MG capsule Take 1 capsule by mouth  daily 07/03/15   [name], MD  EPIPEN 2-PAK 0.3 MG/0.3ML SOAJ injection Inject 0.60ml (0.3mg ) into  the muscle once as directed 12/26/14   [name], MD   fexofenadine (ALLEGRA) 180 MG tablet Take 180 mg by mouth daily. 1/2 tablet once a day    Historical Provider, MD  fluticasone (FLONASE) 50 MCG/ACT nasal spray Use 2 sprays in each  nostril twice daily 08/20/14   [name], MD  FLUZONE HIGH-DOSE 0.5 ML SUSY  08/13/14   Historical Provider, MD  furosemide (LASIX) 40 MG tablet  06/01/15   Historical Provider, MD  glucosamine-chondroitin 500-400 MG tablet Take 1 tablet by mouth 3 (three) times daily.      Historical Provider, MD  Iron, Ferrous Gluconate, 256 (28 FE) MG TABS Take by mouth.    Historical Provider, MD  levothyroxine (SYNTHROID, LEVOTHROID) 50 MCG tablet Take 1 tablet by mouth  daily 07/01/15   [name], MD  metolazone (ZAROXOLYN) 2.5 MG tablet As directed 07/17/15   [name], MD  Multiple Vitamin (MULTIVITAMIN) capsule Take 1 capsule by mouth daily.      Historical Provider, MD  pantoprazole (PROTONIX) 40 MG tablet Take 1 tablet by mouth  daily 04/24/15   [name], MD  polyethylene glycol powder (MIRALAX) powder Take 17 g by mouth as needed.      Historical Provider, MD   BP 140/55 mmHg  Temp(Src) 98.8 F (37.1 C) (Oral)  Resp 23  Ht 5\' 9"  (1.753 m)  SpO2 95% Physical Exam  Constitutional: He is oriented to person, place, and time. He appears well-developed and well-nourished.  Non-toxic appearance. No distress.  HENT:  Head: Normocephalic and atraumatic.  Eyes: Conjunctivae, EOM and lids are normal. Pupils are equal, round, and reactive to light.  Neck: Normal range of motion. Neck supple. No tracheal deviation present. No thyroid mass present.  Cardiovascular: Normal rate, regular rhythm and normal heart sounds.  Exam reveals no gallop.   No murmur heard. Pulmonary/Chest: Effort normal and breath sounds normal. No stridor. No respiratory distress. He has no decreased breath sounds. He has no wheezes. He has no rhonchi. He has no rales.  Abdominal: Soft. Normal appearance and bowel sounds are normal. He exhibits no  distension. There is no tenderness. There is no rebound and no CVA tenderness.  Musculoskeletal: Normal range of motion. He exhibits no edema or tenderness.  Neurological: He is alert and oriented to person, place, and time. He has normal strength. No cranial nerve deficit or sensory deficit. GCS eye subscore is 4. GCS verbal subscore is 5. GCS motor subscore is 6.  Skin: Skin is warm and dry. No abrasion and no rash noted.  Psychiatric: His affect is blunt. His speech is delayed. He is slowed.  Nursing note and vitals reviewed.   ED Course  Procedures (including critical care  time) Labs Review Labs Reviewed  URINE CULTURE  TROPONIN I  CBC WITH DIFFERENTIAL/PLATELET  COMPREHENSIVE METABOLIC PANEL  URINALYSIS, ROUTINE W REFLEX MICROSCOPIC (NOT AT [facility])    Imaging Review No results found. I have personally reviewed and evaluated these images and lab results as part of my medical decision-making.   EKG Interpretation   Date/Time:  Thursday August 08 2015 14:47:37 EDT Ventricular Rate:  88 PR Interval:  221 QRS Duration: 160 QT Interval:  497 QTC Calculation: 601 R Axis:   -79 Text Interpretation:  Unknown rhythm, irregular rate Prolonged PR interval  Right bundle branch block Inferolateral infarct, old qt prolonged  Confirmed by Floreine Kingdon  MD, Guila Owensby ([id number]) on 08/08/2015 4:44:06 PM      MDM   Final diagnoses:  None    Patient given IV fluids here for the static hypotension. Patient's renal function noted and likely from dehydration as he has has diabetics increased recently. He is mentating appropriately. Will be admitted to the medicine service    Lacretia Leigh, MD 08/08/15 [phone]

## 2015-08-09 ENCOUNTER — Inpatient Hospital Stay (HOSPITAL_COMMUNITY): Payer: 59

## 2015-08-09 DIAGNOSIS — E860 Dehydration: Secondary | ICD-10-CM

## 2015-08-09 DIAGNOSIS — R55 Syncope and collapse: Secondary | ICD-10-CM

## 2015-08-09 LAB — COMPREHENSIVE METABOLIC PANEL
ALK PHOS: 51 U/L (ref 38–126)
ALT: 20 U/L (ref 17–63)
ANION GAP: 13 (ref 5–15)
AST: 49 U/L — ABNORMAL HIGH (ref 15–41)
Albumin: 2.9 g/dL — ABNORMAL LOW (ref 3.5–5.0)
BILIRUBIN TOTAL: 1.2 mg/dL (ref 0.3–1.2)
BUN: 66 mg/dL — ABNORMAL HIGH (ref 6–20)
CALCIUM: 8.4 mg/dL — AB (ref 8.9–10.3)
CO2: 43 mmol/L — AB (ref 22–32)
CREATININE: 1.35 mg/dL — AB (ref 0.61–1.24)
Chloride: 85 mmol/L — ABNORMAL LOW (ref 101–111)
GFR, EST AFRICAN AMERICAN: 50 mL/min — AB (ref >60)
GFR, EST NON AFRICAN AMERICAN: 44 mL/min — AB (ref >60)
Glucose, Bld: 114 mg/dL — ABNORMAL HIGH (ref 65–99)
Potassium: <2 mmol/L — CL (ref 3.5–5.1)
Sodium: 141 mmol/L (ref 135–145)
TOTAL PROTEIN: 5.5 g/dL — AB (ref 6.5–8.1)

## 2015-08-09 LAB — BASIC METABOLIC PANEL
ANION GAP: 11 (ref 5–15)
BUN: 55 mg/dL — AB (ref 6–20)
CO2: 41 mmol/L — ABNORMAL HIGH (ref 22–32)
Calcium: 8.4 mg/dL — ABNORMAL LOW (ref 8.9–10.3)
Chloride: 90 mmol/L — ABNORMAL LOW (ref 101–111)
Creatinine, Ser: 1.16 mg/dL (ref 0.61–1.24)
GFR, EST NON AFRICAN AMERICAN: 52 mL/min — AB (ref >60)
Glucose, Bld: 125 mg/dL — ABNORMAL HIGH (ref 65–99)
POTASSIUM: 2.9 mmol/L — AB (ref 3.5–5.1)
SODIUM: 142 mmol/L (ref 135–145)

## 2015-08-09 LAB — URINE CULTURE

## 2015-08-09 LAB — CBC
HCT: 31 % — ABNORMAL LOW (ref 39.0–52.0)
HEMOGLOBIN: 9.9 g/dL — AB (ref 13.0–17.0)
MCH: 32 pg (ref 26.0–34.0)
MCHC: 31.9 g/dL (ref 30.0–36.0)
MCV: 100.3 fL — ABNORMAL HIGH (ref 78.0–100.0)
PLATELETS: 86 10*3/uL — AB (ref 150–400)
RBC: 3.09 MIL/uL — AB (ref 4.22–5.81)
RDW: 17.5 % — ABNORMAL HIGH (ref 11.5–15.5)
WBC: 6.4 10*3/uL (ref 4.0–10.5)

## 2015-08-09 LAB — MAGNESIUM: MAGNESIUM: 2.1 mg/dL (ref 1.7–2.4)

## 2015-08-09 LAB — CORTISOL: Cortisol, Plasma: 12.7 ug/dL

## 2015-08-09 LAB — LACTIC ACID, PLASMA: LACTIC ACID, VENOUS: 2.4 mmol/L — AB (ref 0.5–2.0)

## 2015-08-09 MED ORDER — POTASSIUM CHLORIDE CRYS ER 20 MEQ PO TBCR
40.0000 meq | EXTENDED_RELEASE_TABLET | ORAL | Status: AC
  Administered 2015-08-09 (×2): 40 meq via ORAL
  Filled 2015-08-09 (×3): qty 2

## 2015-08-09 MED ORDER — ENOXAPARIN SODIUM 40 MG/0.4ML ~~LOC~~ SOLN
40.0000 mg | SUBCUTANEOUS | Status: DC
  Administered 2015-08-10: 40 mg via SUBCUTANEOUS
  Filled 2015-08-09: qty 0.4

## 2015-08-09 MED ORDER — LEVOTHYROXINE SODIUM 25 MCG PO TABS
25.0000 ug | ORAL_TABLET | Freq: Every day | ORAL | Status: DC
  Administered 2015-08-10: 25 ug via ORAL
  Filled 2015-08-09: qty 1

## 2015-08-09 MED ORDER — POLYETHYLENE GLYCOL 3350 17 G PO PACK
17.0000 g | PACK | Freq: Every day | ORAL | Status: DC
  Administered 2015-08-09: 17 g via ORAL
  Filled 2015-08-09 (×2): qty 1

## 2015-08-09 MED ORDER — METOPROLOL TARTRATE 12.5 MG HALF TABLET
12.5000 mg | ORAL_TABLET | Freq: Two times a day (BID) | ORAL | Status: DC
  Administered 2015-08-10: 12.5 mg via ORAL
  Filled 2015-08-09 (×3): qty 1

## 2015-08-09 MED ORDER — ATORVASTATIN CALCIUM 10 MG PO TABS
5.0000 mg | ORAL_TABLET | Freq: Every day | ORAL | Status: DC
  Administered 2015-08-09: 5 mg via ORAL
  Filled 2015-08-09: qty 1

## 2015-08-09 MED ORDER — POTASSIUM CHLORIDE CRYS ER 20 MEQ PO TBCR
40.0000 meq | EXTENDED_RELEASE_TABLET | ORAL | Status: AC
  Administered 2015-08-09 (×2): 40 meq via ORAL
  Filled 2015-08-09 (×2): qty 2

## 2015-08-09 MED ORDER — POTASSIUM CHLORIDE 10 MEQ/100ML IV SOLN
10.0000 meq | INTRAVENOUS | Status: AC
  Administered 2015-08-09 (×5): 10 meq via INTRAVENOUS
  Filled 2015-08-09 (×4): qty 100

## 2015-08-09 MED ORDER — DIAZEPAM 2 MG PO TABS
1.0000 mg | ORAL_TABLET | Freq: Every day | ORAL | Status: DC
  Administered 2015-08-10: 1 mg via ORAL
  Filled 2015-08-09: qty 1

## 2015-08-09 NOTE — Progress Notes (Addendum)
PROGRESS NOTE  Victor Green WIO:[id number] DOB: February 22, 1920 DOA: 08/08/2015 PCP: [name], MD  HPI/Recap of past 24 hours:  Feeling better, denies pain, no edema, better short term memory, now aaox3, lethargic, potassium still very low Personal care giver at bedside.  Assessment/Plan: Active Problems:   Dehydration   ARF (acute renal failure) (HCC)   Orthostatic hypotension   Hypokalemia   FTT (failure to thrive) in adult   Falls   Syncope  ARF,  ua no infection, likely from dehydration, due to recent diuretic use.  all diuretics held since admission. renal ultrasound no hydronephrosis. Cr improving with hydration, 1.79-1.59-1.35, continue hydration  Persistent severe Hypokalemia,  k 2.0 on admission,  remain low less than 2 after supplement. Continue oral and iv supplement, repeat k in pm, mag wnl.   QTc prolongation in the setting of hypok and background chronic RBBB. Repeat EKG in am to monitor QTc. Expect QTc improvement with correction of k.  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 Orthostatic vital sign improved with hydration.  H/o hypothyroidism: continue synthroid  H/o of HTN, not on any home meds except diuretics, now orthostatic hypotension on fluids. Hold diuretics.  Lower extremity edema: has mostly resolved, recent venous doppler in 8/ 2016 negative for DVT, but son reported that patient seems to have fluids in his abdomen. no pain on exam, abdominal ultrasound no ascites, no acute abnormalities.  FTT/falls, Likely baseline dementia vs metabolic encephalopathy, improving today, though no prior diagnosis. Per family patient worked up to a year ago, but has gradual decline for a year, with poor short term memory, walks with a walker inside the house with 24/7 care (family hired) at home. Will need PT, likely will need  home health.    DVT prophylaxis: lovenox 30mg  subQ  Code Status: full  Family Communication: patient and personal care giver  Disposition Plan: home in 1-2 days , likely will need home health   Consultants:  none  Procedures:  none  Antibiotics:  none   Objective: BP 119/51 mmHg  Pulse 57  Temp(Src) 98 F (36.7 C) (Oral)  Resp 17  Ht 5\' 9"  (1.753 m)  Wt 169 lb 9.6 oz (76.93 kg)  BMI 25.03 kg/m2  SpO2 91%  Intake/Output Summary (Last 24 hours) at 08/09/15 1308 Last data filed at 08/09/15 1110  Gross per 24 hour  Intake   1083 ml  Output    900 ml  Net    183 ml   Filed Weights   08/08/15 2052  Weight: 169 lb 9.6 oz (76.93 kg)    Exam:   General:  NAD, lethargic, but better oriented today, now though slowing in answering question, he is aaox3  Cardiovascular: RRR  Respiratory: CTABL  Abdomen: Soft/ND/NT, positive BS  Musculoskeletal: No Edema  Neuro: aaox3, impaired short term memory but better than yesterday  Data Reviewed: Basic Metabolic Panel:  Recent Labs Lab 08/08/15 1530 08/08/15 2143 08/09/15 0504  NA 141 140 141  K 2.0* 2.6* <2.0*  CL 78* 82* 85*  CO2 44* 43* 43*  GLUCOSE 199* 157* 114*  BUN 86* 75* 66*  CREATININE 1.79* 1.59* 1.35*  CALCIUM 9.8 8.7* 8.4*  MG  --  2.1 2.1   Liver Function Tests:  Recent Labs Lab 08/08/15 1530 08/09/15 0504  AST 56* 49*  ALT 27 20  ALKPHOS 63 51  BILITOT 2.0* 1.2  PROT 5.7* 5.5*  ALBUMIN 3.7 2.9*   No results for input(s): LIPASE, AMYLASE in the last 168 hours. No results for input(s): AMMONIA in the last 168 hours. CBC:  Recent Labs Lab 08/08/15 1530 08/08/15 2143 08/09/15 0504  WBC 10.4 8.6 6.4  NEUTROABS 8.2*  --   --   HGB 12.1* 11.1* 9.9*  HCT 37.6* 33.7* 31.0*  MCV 99.7 98.5 100.3*  PLT 100* 108* 86*   Cardiac Enzymes:    Recent Labs Lab 08/08/15 1530  TROPONINI 0.05*   BNP (last 3 results) No results for input(s): BNP in the last 8760 hours.  ProBNP  (last 3 results) No results for input(s): PROBNP in the last 8760 hours.  CBG: No results for input(s): GLUCAP in the last 168 hours.  Recent Results (from the past 240 hour(s))  Urine culture     Status: None (Preliminary result)   Collection Time: 08/08/15  3:15 PM  Result Value Ref Range Status   Specimen Description URINE, CLEAN CATCH  Final   Special Requests NONE  Final   Culture CULTURE REINCUBATED FOR BETTER GROWTH  Final   Report Status PENDING  Incomplete     Studies: Ct Head Wo Contrast  08/08/2015   CLINICAL DATA:  Syncope  EXAM: CT HEAD WITHOUT CONTRAST  TECHNIQUE: Contiguous axial images were obtained from the base of the skull through the vertex without intravenous contrast.  COMPARISON:  None.  FINDINGS: Moderate chronic ischemic changes in the periventricular white matter. Advance global atrophy. No mass effect, midline shift, or acute hemorrhage. Prominent atherosclerotic calcifications in the middle cerebral arteries, vertebral arteries and internal carotid arteries. Mastoid air cells are clear. Minimal mucosal thickening in the maxillary sinuses. Mucous retention cyst in left maxillary sinus. Intact cranium.  IMPRESSION: No acute intracranial pathology.  Chronic changes.   Electronically Signed   By: Marybelle Killings M.D.   On: 08/08/2015 23:30   US Renal  08/08/2015   CLINICAL DATA:  Acute renal failure  EXAM: RENAL / URINARY TRACT ULTRASOUND COMPLETE  COMPARISON:  None.  FINDINGS: Right Kidney:  Length: 9.5 cm. Echogenicity within normal limits. No mass or hydronephrosis visualized.  Left Kidney:  Length: 8.0 cm. There is an 8.7 cm cyst at the lower pole of the left kidney. No suspicious focal lesions. No hydronephrosis.  Bladder:  Appears normal for degree of bladder distention.  IMPRESSION: Large lower pole left renal cyst incidentally noted. Negative for hydronephrosis.   Electronically Signed   By: Andreas Newport M.D.   On: 08/08/2015 23:34   Dg Chest Port 1  View  08/08/2015   CLINICAL DATA:  fell twice on the floor in the bathroom. The third time he didn't fall,son caught him and layed him down on the floor and called 911. He did pass out and was confused upon awakening. Per EMS pt was orthostatic  EXAM: PORTABLE CHEST - 1 VIEW  COMPARISON:  11/22/2012  FINDINGS: Lungs are clear. Heart size and mediastinal contours are within normal limits. Elevation of the right diaphragmatic leaflet as before. No pneumothorax. Tortuous atheromatous aorta. No effusion. Degenerative changes in bilateral shoulders.  IMPRESSION: No acute cardiopulmonary disease.   Electronically Signed   By: Lucrezia Europe M.D.   On: 08/08/2015 18:31   US Abdomen Limited Ruq  08/09/2015   CLINICAL DATA:  Elevated LFTs.  Initial encounter.  EXAM: US ABDOMEN LIMITED - RIGHT UPPER QUADRANT  COMPARISON:  None.  FINDINGS: Gallbladder:  Not well  characterized. No stones are identified. No gallbladder wall thickening or pericholecystic fluid is seen. No ultrasonographic Murphy's sign is elicited.  Common bile duct:  Diameter: 0.6 cm, within normal limits in caliber.  Liver:  Not well characterized.  No focal lesions are identified.  IMPRESSION: Evaluation limited due to limitations in patient positioning and overlying structures. No acute abnormality seen at the right upper quadrant.   Electronically Signed   By: Garald Balding M.D.   On: 08/09/2015 02:50    Scheduled Meds: . atorvastatin  10 mg Oral q1800  . brimonidine  1 drop Left Eye BID  . diazepam  2 mg Oral Daily  . dorzolamide  1 drop Left Eye BID  . dutasteride  0.5 mg Oral Daily  . enoxaparin (LOVENOX) injection  30 mg Subcutaneous Q24H  . levothyroxine  50 mcg Oral QAC breakfast  . loratadine  5 mg Oral Daily  . pantoprazole  40 mg Oral Daily  . potassium chloride  10 mEq Intravenous Q1 Hr x 6  . sodium chloride  3 mL Intravenous Q12H    Continuous Infusions: . 0.9 % NaCl with KCl 20 mEq / L 125 mL/hr at 08/08/15 2343     Time  spent: 40mins  Sirenity Shew MD, PhD  [facility] Pager [phone]. If 7PM-7AM, please contact night-coverage at [email or web address], password [facility] 08/09/2015, 1:08 PM  LOS: 1 day       4:50pm addendum: 20beats of SVT on tele, will start low dose lopressor, continue replace k, mag wnl, will check tsh. Repeat bmp in the pm, persistent hypok, but cr normalized, lactic acid trending down.

## 2015-08-09 NOTE — Progress Notes (Signed)
Utilization review completed. Argusta Mcgann, RN, BSN. 

## 2015-08-09 NOTE — Progress Notes (Signed)
  Echocardiogram 2D Echocardiogram has been performed.  Lysle Rubens 08/09/2015, 1:52 PM

## 2015-08-09 NOTE — Progress Notes (Signed)
PT Cancellation Note  Patient Details Name: Victor Green MRN: [id number] DOB: Sep 13, 1920   Cancelled Treatment:    Reason Eval/Treat Not Completed: Medical issues which prohibited therapy (K <2.0 without sufficient repletion)   [name] 08/09/2015, 9:16 AM Elwyn Reach, [place]

## 2015-08-09 NOTE — Progress Notes (Signed)
CRITICAL VALUE ALERT  Critical value received:  Lactic Acid = 2.4  Date of notification:  08-09-2015  Time of notification:  14:45  Critical value read back:Yes.    Nurse who received alert:  Maggie Font, RN  MD notified (1st page):  Florencia Reasons  Time of first page:  14:46  MD notified (2nd page): Florencia Reasons  Time of second page: 15:49  Responding MD:  Florencia Reasons  Time MD responded:  16:14

## 2015-08-09 NOTE — Progress Notes (Signed)
Pt ran 20 beats of SVT. MD notified. Will continue to monitor pt.   Angus Seller

## 2015-08-10 LAB — HEPATIC FUNCTION PANEL
ALK PHOS: 61 U/L (ref 38–126)
ALT: 21 U/L (ref 17–63)
AST: 42 U/L — ABNORMAL HIGH (ref 15–41)
Albumin: 2.8 g/dL — ABNORMAL LOW (ref 3.5–5.0)
BILIRUBIN DIRECT: 0.2 mg/dL (ref 0.1–0.5)
BILIRUBIN INDIRECT: 1.1 mg/dL — AB (ref 0.3–0.9)
Total Bilirubin: 1.3 mg/dL — ABNORMAL HIGH (ref 0.3–1.2)
Total Protein: 5.4 g/dL — ABNORMAL LOW (ref 6.5–8.1)

## 2015-08-10 LAB — BASIC METABOLIC PANEL
Anion gap: 9 (ref 5–15)
BUN: 42 mg/dL — AB (ref 6–20)
CHLORIDE: 97 mmol/L — AB (ref 101–111)
CO2: 34 mmol/L — ABNORMAL HIGH (ref 22–32)
CREATININE: 1.05 mg/dL (ref 0.61–1.24)
Calcium: 8.6 mg/dL — ABNORMAL LOW (ref 8.9–10.3)
GFR calc Af Amer: >60 mL/min (ref >60)
GFR, EST NON AFRICAN AMERICAN: 59 mL/min — AB (ref >60)
GLUCOSE: 108 mg/dL — AB (ref 65–99)
POTASSIUM: 4.1 mmol/L (ref 3.5–5.1)
Sodium: 140 mmol/L (ref 135–145)

## 2015-08-10 LAB — CBC
HCT: 29.5 % — ABNORMAL LOW (ref 39.0–52.0)
Hemoglobin: 9.6 g/dL — ABNORMAL LOW (ref 13.0–17.0)
MCH: 32.7 pg (ref 26.0–34.0)
MCHC: 32.5 g/dL (ref 30.0–36.0)
MCV: 100.3 fL — AB (ref 78.0–100.0)
PLATELETS: 77 10*3/uL — AB (ref 150–400)
RBC: 2.94 MIL/uL — AB (ref 4.22–5.81)
RDW: 17.7 % — ABNORMAL HIGH (ref 11.5–15.5)
WBC: 6.7 10*3/uL (ref 4.0–10.5)

## 2015-08-10 LAB — TSH: TSH: 2.571 u[IU]/mL (ref 0.350–4.500)

## 2015-08-10 LAB — LACTIC ACID, PLASMA: LACTIC ACID, VENOUS: 1.7 mmol/L (ref 0.5–2.0)

## 2015-08-10 MED ORDER — METOPROLOL TARTRATE 25 MG PO TABS: 12.5000 mg | ORAL_TABLET | Freq: Two times a day (BID) | ORAL | Status: DC

## 2015-08-10 MED ORDER — POTASSIUM CHLORIDE ER 10 MEQ PO TBCR: 20.0000 meq | EXTENDED_RELEASE_TABLET | Freq: Every day | ORAL | Status: DC

## 2015-08-10 MED ORDER — SENNOSIDES-DOCUSATE SODIUM 8.6-50 MG PO TABS
2.0000 | ORAL_TABLET | Freq: Two times a day (BID) | ORAL | Status: DC
  Filled 2015-08-10: qty 2

## 2015-08-10 NOTE — Clinical Social Work Note (Signed)
Clinical Social Worker arranged ambulance transport home - confirmed home address with patient family.  Clinical Social Worker will sign off for now as social work intervention is no longer needed. Please consult [place] again if new need arises.  Barbette Or, [place]

## 2015-08-10 NOTE — Care Management Note (Signed)
Case Management Note  Patient Details  Name: Victor Green MRN: [id number] Date of Birth: 10-01-1920  Subjective/Objective:                   Falls , syncope/presyncope Action/Plan: Discharge planning  Expected Discharge Date:  08/10/15               Expected Discharge Plan:  [place]  In-House Referral:     Discharge planning Services  CM Consult  Post Acute Care Choice:  Home Health Choice offered to:  Adult Children  DME Arranged:    DME Agency:     HH Arranged:  PT HH Agency:  [place]  Status of Service:  In process, will continue to follow  Medicare Important Message Given:    Date Medicare IM Given:    Medicare IM give by:    Date Additional Medicare IM Given:    Additional Medicare Important Message give by:     If discussed at [place] of Stay Meetings, dates discussed:    Additional Comments: CM received call from RN stating pt ready to go home and needs HHPT setup with [facility].  Cm spoke with son of pt, Victor Green who confirms [facility] for HHPT. Victor Green provides 24 hour aide care.  Cm called [facility]  and recording prompted to call [phone] which had a recording to NOT leave a message but rather continue to call.  After several attempts, (at least 6 more times), CM faxed facesheet, orders, F2F, H&P, and DC summary to [facility] fax [phone].  CM left email for weekday Cm to please follow up on HHPT referral.     #[name], Moss Mc, RN 08/10/2015, 1:47 PM

## 2015-08-10 NOTE — Discharge Summary (Addendum)
Discharge Summary  Victor Green OVF:[id number] DOB: 1920/10/18  PCP: Victor Snare, MD  Admit date: 08/08/2015 Discharge date: 08/10/2015  Time spent: <64mins  10/9 addnendum: family requested new prescription to be called in to local pharmacy, lopressor and potassium [facility] at [phone].  Recommendations for Outpatient Follow-up:  1. F/u with PMD within three days, repeat bmp at follow up, further titration of diuretic at pmd's discretion. 2. Meds change at discharge, started lopressor, started potassium supplement. stopped metolazone, decreased lasix.  Discharge Diagnoses:  Active Hospital Problems   Diagnosis Date Noted  . Dehydration 08/08/2015  . ARF (acute renal failure) ([place]) 08/08/2015  . Orthostatic hypotension 08/08/2015  . Hypokalemia 08/08/2015  . FTT (failure to thrive) in adult 08/08/2015  . Falls 08/08/2015  . Syncope 08/08/2015    Resolved Hospital Problems   Diagnosis Date Noted Date Resolved  No resolved problems to display.    Discharge Condition: stable  Diet recommendation: heart healthy  Filed Weights   08/08/15 2052 08/09/15 2059  Weight: 169 lb 9.6 oz (76.93 kg) 171 lb 4.8 oz (77.7 kg)    History of present illness:  Victor Green is a 81 y.o. male  With has been treated for lower extremity edema with increase dose of diuretics fell twice today on the floor in the bathroom, the third time, sone caught him and layed him down on the floor and called 911, per EMS patient was orthostatic on arrival, patient was given 300cc ivf enroute to the ED.  ED course: he was orthostatic, ua no infection, labs showed arf/hypiokalemia, he was given ivf, potassium supplement, hospitalist called to admit the patient. Patient currently only oriented to person, not able to provide detailed history, but denies pain, no sob, no fever, denies diarrhea. Reported lower extremity has much improved compare to before.  Hospital Course:  Active  Problems:   Dehydration   ARF (acute renal failure) (H[facility])   Orthostatic hypotension   Hypokalemia   FTT (failure to thrive) in adult   Falls   Syncope  ARF,  ua no infection, likely from dehydration, due to recent diuretic use. all diuretics held since admission. renal ultrasound no hydronephrosis. Cr improving and normalized at discharge with hydration, 1.79-1.59-1.35-1.05  Persistent severe Hypokalemia,  k 2.0 on admission,  remain low less than 2 after supplement. Continue oral and iv supplement, mag wnl.  Potassium normalized at discharge, he is discharged with oral potassium supplement.  QTc prolongation  in the setting of hypok and background chronic RBBB. QTC on presentation 601,  Much improved after potassium supplement, Repeat EKG at discharge QTC 483  Syncope/Presyncope and orthostatic hypotension, likely secondary to dehydration, Tele with chronic 1 degree AV blocker, RBBB , am cortisol level wnl, echo LVEF 84-16%, grade I diastolic dysfunction , CT head negative. Orthostatic vital sign improved with hydration.  SVT: had one episode of 20runs of SVT on tele, patient is asymptomatic, low dose lopressor started.  H/o hypothyroidism: continue synthroid  H/o of HTN,  not on any home meds except diuretics, diuretics held during hospitalization due orthostatic hypotension. bp trending up at discharge, started low dose lopressor. Decreased diuretic ( d/c metolazone, decrease lasix from 40mg  qd to 20mg  qd  Bilateral Lower extremity edema:  Minimal right lower extremity edema on presentationno edema at discharge, recent venous doppler in 8/ 2016 negative for DVT, but son reported that patient seems to have fluids in his abdomen. no pain on exam, abdominal ultrasound no ascites,  no acute abnormalities. Diuretic adjusted as mentioned above. Instructed patient to perform daily weight.  FTT/falls, Likely baseline dementia + metabolic encephalopathy, improved, though no prior  diagnosis. Per family patient worked up to a year ago, but has gradual decline for a year, with poor short term memory, walks with a walker inside the house with 24/7 care (family hired) at home. recommend home health with PT. Fall precaution.    DVT prophylaxis: lovenox 30mg  subQ in the hospital  Code Status: full, confirmed with patient  Family Communication: patient and personal care giver  Disposition Plan: home with home health   Consultants:  none  Procedures:  none  Antibiotics:  none   Discharge Exam: BP 147/66 mmHg  Pulse 62  Temp(Src) 98.9 F (37.2 C) (Oral)  Resp 16  Ht 5\' 9"  (1.753 m)  Wt 171 lb 4.8 oz (77.7 kg)  BMI 25.28 kg/m2  SpO2 92%   General: NAD, awake, now fully oriented, though slowing in answering question, short term memory remain poor  Cardiovascular: RRR  Respiratory: CTABL  Abdomen: Soft/ND/NT, positive BS  Musculoskeletal: No Edema  Neuro: aaox3, impaired short term memory    Discharge Instructions You were cared for by a hospitalist during your hospital stay. If you have any questions about your discharge medications or the care you received while you were in the hospital after you are discharged, you can call the unit and asked to speak with the hospitalist on call if the hospitalist that took care of you is not available. Once you are discharged, your primary care physician will handle any further medical issues. Please note that NO REFILLS for any discharge medications will be authorized once you are discharged, as it is imperative that you return to your primary care physician (or establish a relationship with a primary care physician if you do not have one) for your aftercare needs so that they can reassess your need for medications and monitor your lab values.  Discharge Instructions    Diet - low sodium heart healthy    Complete by:  As directed      Face-to-face encounter (required for Medicare/Medicaid patients)     Complete by:  As directed   I Victor Green certify that this patient is under my care and that I, or a nurse practitioner or physician's assistant working with me, had a face-to-face encounter that meets the physician face-to-face encounter requirements with this patient on 08/09/2015. The encounter with the patient was in whole, or in part for the following medical condition(s) which is the primary reason for home health care (List medical condition): FTT  The encounter with the patient was in whole, or in part, for the following medical condition, which is the primary reason for home health care:  FTT/falls  I certify that, based on my findings, the following services are medically necessary home health services:  Physical therapy  Reason for Medically Necessary Home Health Services:  Skilled Nursing- Change/Decline in Patient Status  My clinical findings support the need for the above services:  Unable to leave home safely without assistance and/or assistive device  Further, I certify that my clinical findings support that this patient is homebound due to:  Mental confusion     Home Health    Complete by:  As directed   To provide the following care/treatments:  PT     Increase activity slowly    Complete by:  As directed  Medication List    STOP taking these medications        metolazone 2.5 MG tablet  Commonly known as:  ZAROXOLYN      TAKE these medications        acetaminophen 325 MG tablet  Commonly known as:  TYLENOL  Take 325 mg by mouth 2 (two) times daily.     atorvastatin 10 MG tablet  Commonly known as:  LIPITOR  Take 1 tablet by mouth  daily     azelastine 0.1 % nasal spray  Commonly known as:  ASTELIN  Place 1 spray into both nostrils 2 (two) times daily. Use in each nostril as directed     B-COMPLEX PO  Take 1 tablet by mouth daily.     brimonidine 0.15 % ophthalmic solution  Commonly known as:  ALPHAGAN  Place 1 drop into the left eye 2 (two) times  daily.     dextromethorphan-guaiFENesin 30-600 MG 12hr tablet  Commonly known as:  MUCINEX DM  Take 0.5 tablets by mouth daily. Takes 1/2 tablet daily     diazepam 2 MG tablet  Commonly known as:  VALIUM  Take 1 tablet (2 mg total) by mouth daily.     diclofenac sodium 1 % Gel  Commonly known as:  VOLTAREN  Apply 4 g topically 4 (four) times daily.     dorzolamide 2 % ophthalmic solution  Commonly known as:  TRUSOPT  Place 1 drop into the left eye 2 (two) times daily.     dutasteride 0.5 MG capsule  Commonly known as:  AVODART  Take 1 capsule by mouth  daily     EPIPEN 2-PAK 0.3 mg/0.3 mL Soaj injection  Generic drug:  EPINEPHrine  Inject 0.49ml (0.3mg ) into  the muscle once as directed     fexofenadine 180 MG tablet  Commonly known as:  ALLEGRA  Take 60 mg by mouth daily. 1/2 tablet once a day     fluticasone 50 MCG/ACT nasal spray  Commonly known as:  FLONASE  Use 2 sprays in each  nostril twice daily     furosemide 40 MG tablet  Commonly known as:  LASIX  Take 20 mg by mouth daily.     glucosamine-chondroitin 500-400 MG tablet  Take 1 tablet by mouth 2 (two) times daily.     Iron (Ferrous Gluconate) 256 (28 FE) MG Tabs  Take 1 tablet by mouth daily.     levothyroxine 50 MCG tablet  Commonly known as:  SYNTHROID, LEVOTHROID  Take 1 tablet by mouth  daily     metoprolol tartrate 25 MG tablet  Commonly known as:  LOPRESSOR  Take 0.5 tablets (12.5 mg total) by mouth 2 (two) times daily.     MIRALAX powder  Generic drug:  polyethylene glycol powder  Take 17 g by mouth daily.     multivitamin capsule  Take 1 capsule by mouth daily.     pantoprazole 40 MG tablet  Commonly known as:  PROTONIX  Take 1 tablet by mouth  daily     potassium chloride 10 MEQ tablet  Commonly known as:  K-DUR  Take 2 tablets (20 mEq total) by mouth daily.       Allergies  Allergen Reactions  . Tetanus Toxoids        Follow-up Information    Follow up with [facility], [facility]  In 3 days.   Specialty:  Endocrinology   Why:  hospital discharge follow up, repeat bmp at follow up,  continue adjust diuretic meds.   Contact information:   [address] [place] [address] [phone]        The results of significant diagnostics from this hospitalization (including imaging, microbiology, ancillary and laboratory) are listed below for reference.    Significant Diagnostic Studies: Ct Head Wo Contrast  08/08/2015   CLINICAL DATA:  Syncope  EXAM: CT HEAD WITHOUT CONTRAST  TECHNIQUE: Contiguous axial images were obtained from the base of the skull through the vertex without intravenous contrast.  COMPARISON:  None.  FINDINGS: Moderate chronic ischemic changes in the periventricular white matter. Advance global atrophy. No mass effect, midline shift, or acute hemorrhage. Prominent atherosclerotic calcifications in the middle cerebral arteries, vertebral arteries and internal carotid arteries. Mastoid air cells are clear. Minimal mucosal thickening in the maxillary sinuses. Mucous retention cyst in left maxillary sinus. Intact cranium.  IMPRESSION: No acute intracranial pathology.  Chronic changes.   Electronically Signed   By: Marybelle Killings M.D.   On: 08/08/2015 23:30   US Renal  08/08/2015   CLINICAL DATA:  Acute renal failure  EXAM: RENAL / URINARY TRACT ULTRASOUND COMPLETE  COMPARISON:  None.  FINDINGS: Right Kidney:  Length: 9.5 cm. Echogenicity within normal limits. No mass or hydronephrosis visualized.  Left Kidney:  Length: 8.0 cm. There is an 8.7 cm cyst at the lower pole of the left kidney. No suspicious focal lesions. No hydronephrosis.  Bladder:  Appears normal for degree of bladder distention.  IMPRESSION: Large lower pole left renal cyst incidentally noted. Negative for hydronephrosis.   Electronically Signed   By: Andreas Newport M.D.   On: 08/08/2015 23:34   Dg Chest Port 1 View  08/08/2015   CLINICAL DATA:  fell twice on the floor in the bathroom.  The third time he didn't fall,son caught him and layed him down on the floor and called 911. He did pass out and was confused upon awakening. Per EMS pt was orthostatic  EXAM: PORTABLE CHEST - 1 VIEW  COMPARISON:  11/22/2012  FINDINGS: Lungs are clear. Heart size and mediastinal contours are within normal limits. Elevation of the right diaphragmatic leaflet as before. No pneumothorax. Tortuous atheromatous aorta. No effusion. Degenerative changes in bilateral shoulders.  IMPRESSION: No acute cardiopulmonary disease.   Electronically Signed   By: Lucrezia Europe M.D.   On: 08/08/2015 18:31   US Abdomen Limited Ruq  08/09/2015   CLINICAL DATA:  Elevated LFTs.  Initial encounter.  EXAM: US ABDOMEN LIMITED - RIGHT UPPER QUADRANT  COMPARISON:  None.  FINDINGS: Gallbladder:  Not well characterized. No stones are identified. No gallbladder wall thickening or pericholecystic fluid is seen. No ultrasonographic Murphy's sign is elicited.  Common bile duct:  Diameter: 0.6 cm, within normal limits in caliber.  Liver:  Not well characterized.  No focal lesions are identified.  IMPRESSION: Evaluation limited due to limitations in patient positioning and overlying structures. No acute abnormality seen at the right upper quadrant.   Electronically Signed   By: Garald Balding M.D.   On: 08/09/2015 02:50    Microbiology: Recent Results (from the past 240 hour(s))  Urine culture     Status: None   Collection Time: 08/08/15  3:15 PM  Result Value Ref Range Status   Specimen Description URINE, CLEAN CATCH  Final   Special Requests NONE  Final   Culture MULTIPLE SPECIES PRESENT, SUGGEST RECOLLECTION  Final   Report Status 08/09/2015 FINAL  Final     Labs: Basic  Metabolic Panel:  Recent Labs Lab 08/08/15 1530 08/08/15 2143 08/09/15 0504 08/09/15 1345 08/10/15 0536  NA 141 140 141 142 140  K 2.0* 2.6* <2.0* 2.9* 4.1  CL 78* 82* 85* 90* 97*  CO2 44* 43* 43* 41* 34*  GLUCOSE 199* 157* 114* 125* 108*  BUN 86* 75*  66* 55* 42*  CREATININE 1.79* 1.59* 1.35* 1.16 1.05  CALCIUM 9.8 8.7* 8.4* 8.4* 8.6*  MG  --  2.1 2.1  --   --    Liver Function Tests:  Recent Labs Lab 08/08/15 1530 08/09/15 0504 08/10/15 0536  AST 56* 49* 42*  ALT 27 20 21   ALKPHOS 63 51 61  BILITOT 2.0* 1.2 1.3*  PROT 5.7* 5.5* 5.4*  ALBUMIN 3.7 2.9* 2.8*   No results for input(s): LIPASE, AMYLASE in the last 168 hours. No results for input(s): AMMONIA in the last 168 hours. CBC:  Recent Labs Lab 08/08/15 1530 08/08/15 2143 08/09/15 0504 08/10/15 0536  WBC 10.4 8.6 6.4 6.7  NEUTROABS 8.2*  --   --   --   HGB 12.1* 11.1* 9.9* 9.6*  HCT 37.6* 33.7* 31.0* 29.5*  MCV 99.7 98.5 100.3* 100.3*  PLT 100* 108* 86* PENDING   Cardiac Enzymes:  Recent Labs Lab 08/08/15 1530  TROPONINI 0.05*   BNP: BNP (last 3 results) No results for input(s): BNP in the last 8760 hours.  ProBNP (last 3 results) No results for input(s): PROBNP in the last 8760 hours.  CBG: No results for input(s): GLUCAP in the last 168 hours.     SignedFlorencia Reasons MD, PhD  [facility] 08/10/2015, 9:11 AM

## 2015-08-11 NOTE — Progress Notes (Signed)
Patient's son Shanon Brow) called to request that the patient's prescriptions be called in to a local pharmacy ([facility] on [address]).  The discharge RXs were sent to patient's [facility] pharmacy on 08-10-2015.  MD notified.  MD will call in RXs to the requested local pharmacy.  Jillyn Ledger, MBA, BS, RN

## 2015-08-12 ENCOUNTER — Other Ambulatory Visit: Payer: Self-pay | Admitting: *Deleted

## 2015-08-12 ENCOUNTER — Telehealth: Payer: Self-pay | Admitting: Endocrinology

## 2015-08-12 MED ORDER — POTASSIUM CHLORIDE ER 10 MEQ PO TBCR: 20.0000 meq | EXTENDED_RELEASE_TABLET | Freq: Every day | ORAL | Status: DC

## 2015-08-12 NOTE — Telephone Encounter (Signed)
Please see below and advise if okay to send? 

## 2015-08-12 NOTE — Telephone Encounter (Signed)
Okay to refill , needs to be seen this week for follow-up

## 2015-08-12 NOTE — Telephone Encounter (Signed)
Patient Name: KHOA OPDAHL Gender: Male DOB: Aug 11, 1920 Age: 81 Y 34 M 3 D Return Phone Number: [phone] (Primary), [phone] (Secondary) Address: City/State/Zip: [place] Client [facility] Night - Client Client Site [facility] Physician [name] Contact Type Call Call Type Triage / Clinical Caller Name Shanon Brow Relationship To Patient Son Return Phone Number [phone] (Primary) Chief Complaint Prescription Refill or Medication Request (non symptomatic) Initial Comment Caller states father discharged yesterday with 2 new rx through mail-order service. Needs something to be called in sooner. Nurse Assessment Nurse: Thad Ranger, RN, Langley Gauss Date/Time (Eastern Time): 08/11/2015 10:31:03 AM Please select the assessment type ---RX called in but not at pharm Additional Documentation ---Caller states his father has been in the hospital for Hypokalemia after being placed on Lasix. States his K+ was <1 in the hospital and they got it up to 3.5. States the d/c'ing nurse at the hospital has called the Rx for K+ to the mail order pharm instead of calling to the local [facility]. Needs the [facility] the Potassium [facility]. States Dr Maudie Mercury is his PCP

## 2015-08-14 ENCOUNTER — Ambulatory Visit: Payer: 59 | Admitting: Endocrinology

## 2015-08-16 ENCOUNTER — Telehealth: Payer: Self-pay | Admitting: Endocrinology

## 2015-08-16 ENCOUNTER — Other Ambulatory Visit: Payer: Self-pay | Admitting: *Deleted

## 2015-08-16 MED ORDER — DICLOFENAC SODIUM 1 % TD GEL: TRANSDERMAL | Status: DC

## 2015-08-16 NOTE — Telephone Encounter (Signed)
Patient recently discharged from the hospital and unable to come for office visit.  His son had concerns about following:  Continuing Lasix.  Has not been up and about and has not had any edema.  Advised him not to continue Lasix because of his history of orthostatic syncope recently  Follow-up labs: Will need to do a BMP drawn by the home visiting nurse, to call [facility] to draw the blood  Start Voltaren gel for knee pain, apply 1% gel 3 times a day, 60 g tube

## 2015-08-16 NOTE — Telephone Encounter (Signed)
Noted, lab order faxed to [facility], they will draw on Monday.  Rx sent for Voltaren.

## 2015-08-20 NOTE — Telephone Encounter (Signed)
Pharmacist was concerned that the patient was on potassium and amiloride    informed her that the amiloride was stopped per Dr. Dwyane Dee at his last office visit.

## 2015-08-20 NOTE — Telephone Encounter (Signed)
Sheika from [facility] ned clarification on patient medication potasium chloride [id number] ref # [id number]

## 2015-08-21 ENCOUNTER — Other Ambulatory Visit: Payer: Self-pay | Admitting: Endocrinology

## 2015-09-02 ENCOUNTER — Ambulatory Visit (INDEPENDENT_AMBULATORY_CARE_PROVIDER_SITE_OTHER): Payer: 59 | Admitting: Endocrinology

## 2015-09-02 ENCOUNTER — Other Ambulatory Visit: Payer: Self-pay

## 2015-09-02 VITALS — BP 143/67 | HR 67 | Temp 98.4°F | Wt 207.0 lb

## 2015-09-02 DIAGNOSIS — E440 Moderate protein-calorie malnutrition: Secondary | ICD-10-CM

## 2015-09-02 DIAGNOSIS — R601 Generalized edema: Secondary | ICD-10-CM

## 2015-09-02 DIAGNOSIS — D539 Nutritional anemia, unspecified: Secondary | ICD-10-CM

## 2015-09-02 DIAGNOSIS — R609 Edema, unspecified: Secondary | ICD-10-CM

## 2015-09-02 DIAGNOSIS — E039 Hypothyroidism, unspecified: Secondary | ICD-10-CM

## 2015-09-02 DIAGNOSIS — D696 Thrombocytopenia, unspecified: Secondary | ICD-10-CM

## 2015-09-02 NOTE — Progress Notes (Signed)
Patient ID: Victor Green, male   DOB: 26-Nov-1919, 81 y.o.   MRN: [id number]   Chief complaint: Review of multiple problems, post hospitalization  History of Present Illness:   1.    Leg edema: he has had lower leg edema which has been persistent for a couple of years  Etiology is unclear as he has had no systemic disease or renal insufficiency. It has been felt to be  from venous insufficiency  On his last visit because of increasing edema he was told to take Lasix daily and also take Zaroxolyn occasionally as needed if edema was not controlled. However on 08/08/15 he felt very weak and was admitted to the hospital with dehydration, hypokalemia and low normal blood pressure His diuretics were stopped  Since he has been home he has taken Lasix only occasionally He has however been mostly in bed because of generalized weakness His weight has gone up  Wt Readings from Last 3 Encounters:  09/02/15 207 lb (93.895 kg)  08/09/15 171 lb 4.8 oz (77.7 kg)  07/17/15 194 lb 9.6 oz (88.27 kg)     2. History of hypertension: Previously  had hypertension with mild hyperaldosteronism but has not had any hypokalemia.  Currently not requiring any antihypertensive medications   3. Anemia: Hemoglobin has ranged from 10.8 -11.6. His last iron level was normal.  Ferritin previously low normal at 38 and he is taking iron supplements; previously B12 was normal   Hematology evaluation was negative and no further follow-up recommended  Also continues to have persistent thrombocytopenia of unclear etiology, no bleeding tendencies Was more anemic in the hospital  Lab Results  Component Value Date   WBC 6.7 08/10/2015   HGB 9.6* 08/10/2015   HCT 29.5* 08/10/2015   MCV 100.3* 08/10/2015   PLT 77* 08/10/2015    4.. Hypercholesterolemia: Mild and well-controlled with low dose Lipitor, tends to have low HDL.   Lab Results  Component Value Date   CHOL 111 07/17/2015   HDL 51.70 07/17/2015    LDLCALC 47 07/17/2015   TRIG 65.0 07/17/2015   CHOLHDL 2 07/17/2015     5.   Hypothyroidism.  He has been on low-dose supplementation with 25 g again since his TSH had gone up to 8.1 in 2/16 with stopping his thyroid supplement in 9/15 He does not feel any different with starting the thyroid supplement including any changes in concentration or memory Recent thyroid level has been normal   Lab Results  Component Value Date   TSH 2.571 08/10/2015   TSH 6.19* 07/17/2015   TSH 3.82 03/18/2015   FREET4 0.96 07/17/2015   FREET4 0.79 03/18/2015   FREET4 0.95 12/05/2014           Medication List       This list is accurate as of: 09/02/15 11:59 PM.  Always use your most recent med list.               acetaminophen 325 MG tablet  Commonly known as:  TYLENOL  Take 325 mg by mouth 2 (two) times daily.     atorvastatin 10 MG tablet  Commonly known as:  LIPITOR  Take 1 tablet by mouth  daily     azelastine 0.1 % nasal spray  Commonly known as:  ASTELIN  Place 1 spray into both nostrils 2 (two) times daily. Use in each nostril as directed     azelastine 0.1 % nasal spray  Commonly known as:  ASTELIN  Use 1 spray in each nostril two times daily as directed     B-COMPLEX PO  Take 1 tablet by mouth daily.     brimonidine 0.15 % ophthalmic solution  Commonly known as:  ALPHAGAN  Place 1 drop into the left eye 2 (two) times daily.     dextromethorphan-guaiFENesin 30-600 MG 12hr tablet  Commonly known as:  MUCINEX DM  Take 0.5 tablets by mouth daily. Takes 1/2 tablet daily     diazepam 2 MG tablet  Commonly known as:  VALIUM  Take 1 tablet (2 mg total) by mouth daily.     diclofenac sodium 1 % Gel  Commonly known as:  VOLTAREN  Apply 3 times per day     dorzolamide 2 % ophthalmic solution  Commonly known as:  TRUSOPT  Place 1 drop into the left eye 2 (two) times daily.     dutasteride 0.5 MG capsule  Commonly known as:  AVODART  Take 1 capsule by mouth   daily     EPIPEN 2-PAK 0.3 mg/0.3 mL Soaj injection  Generic drug:  EPINEPHrine  Inject 0.71ml (0.3mg ) into  the muscle once as directed     fexofenadine 180 MG tablet  Commonly known as:  ALLEGRA  Take 60 mg by mouth daily. 1/2 tablet once a day     fluticasone 50 MCG/ACT nasal spray  Commonly known as:  FLONASE  Use 2 sprays in each  nostril twice daily     furosemide 40 MG tablet  Commonly known as:  LASIX  Take 20 mg by mouth daily.     glucosamine-chondroitin 500-400 MG tablet  Take 1 tablet by mouth 2 (two) times daily.     Iron (Ferrous Gluconate) 256 (28 FE) MG Tabs  Take 1 tablet by mouth daily.     levothyroxine 50 MCG tablet  Commonly known as:  SYNTHROID, LEVOTHROID  Take 1 tablet by mouth  daily     metoprolol tartrate 25 MG tablet  Commonly known as:  LOPRESSOR  Take 0.5 tablets (12.5 mg total) by mouth 2 (two) times daily.     MIRALAX powder  Generic drug:  polyethylene glycol powder  Take 17 g by mouth daily.     multivitamin capsule  Take 1 capsule by mouth daily.     pantoprazole 40 MG tablet  Commonly known as:  PROTONIX  Take 1 tablet by mouth  daily     potassium chloride 10 MEQ tablet  Commonly known as:  K-DUR  Take 2 tablets (20 mEq total) by mouth daily.        Allergies:  Allergies  Allergen Reactions  . Tetanus Toxoids     Past Medical History  Diagnosis Date  . Hypertension   . CAD (coronary artery disease) complete w/u in 2009    extensive coronary calcification by EBCT, normal myoview, echo with EF 70% mild AI  . Hyperlipidemia   . Aortic insufficiency     echo July 2011 EF 60%  . 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 History of GI bleed 2003    due to pyloric-positive ulcer   . Diverticulosis     with remote history of diverticulitis  . Plantar fasciitis     chronic  . Allergic rhinitis     responding well to desensitation injections  . Glaucoma   . Osteoarthritis     both knees  . Asthma      Past Surgical  History  Procedure Laterality Date  . Tonsillectomy    . Hemorrhoid surgery  1996  . Hernia repair  1996    right  . Lumbar laminectomy  2003  . Cataract extraction      left eye    Family History  Problem Relation Age of Onset  . Thrombosis Father     mesenteric artery thrombosis after prostate surgery  . Colon cancer Mother     Social History:  reports that he has never smoked. He does not have any smokeless tobacco history on file. He reports that he does not drink alcohol or use illicit drugs.  Review of Systems -    DECREASED appetite: Before and after his hospitalization he was eating poorly However he is now getting 24 hour care at home and he is eating much better and apparently his attendant says that he is eating 3 full meals now  JOINT pain: He was complaining about his right knee hurting after his hospitalization.  He was tried on diclofenac gel which is helping him somewhat.  He is not being given any oral nonsteroidal drugs    EXAM:  BP 143/67 mmHg  Pulse 67  Temp(Src) 98.4 F (36.9 C) (Oral)  Wt 207 lb (93.895 kg)  SpO2 93%  No jugular venous distention. He has swelling of his arms and legs with 2+ lower leg edema Also has minimal swelling of the skin of the lower abdomen Heart sounds are normal without murmurs or gallops Lungs clear although decreased breath sounds at the base No abdominal mass or tenderness  Right knee is significantly enlarged with swelling and warmth compared to the left Left second toe has mild purplish discoloration which can be blanched somewhat.  Distally there is a scab present with some darkening.  There is no warmth or tenderness.  The toe is not unusually cold, Pedal pulses are not palpable   Assessment/Plan:   EDEMA: This is now appearing to be related to hypoalbuminemia with his albumin level being significantly low in the hospital He has gained a significant amount of weight reflecting fluid  retention  Since his blood pressure is adequate he can start back on Lasix 20 mg daily at least Will need to watch his renal function and potassium with weekly lab draws using the home nurse   TOE ulcer : This is healed  Right second toe discoloration: Does not appear to be vascular insufficiency, may be mostly related to poor venous return and some degree of peripheral cyanosis, will continue observation  OSTEOARTHRITIS of the knee.  He is reluctant to see orthopedic surgeon for steroid injection, will continue topical diclofenac gel  Persistent anemia and thrombocytopenia of unclear etiology : CBC to be checked today as hemoglobin was below 10 in the hospital, likely to be related to acute illness and under nutrition as shown by his low albumin state in the hospital  Malnutrition: His intake is much better now and will periodically check his serum albumin level   Patient Instructions  20mg  Lasix daily    Total visit time for review of hospital records, multiple medical issues, exam, labs = 25 minutes   Jshawn Hurta

## 2015-09-02 NOTE — Patient Instructions (Signed)
20 mg Lasix daily

## 2015-09-03 LAB — BASIC METABOLIC PANEL
BUN: 37 mg/dL — ABNORMAL HIGH (ref 6–23)
CO2: 27 mEq/L (ref 19–32)
Calcium: 8.5 mg/dL (ref 8.4–10.5)
Chloride: 107 mEq/L (ref 96–112)
Creatinine, Ser: 1.01 mg/dL (ref 0.40–1.50)
GFR: 73.21 mL/min (ref >60.00)
GLUCOSE: 87 mg/dL (ref 70–99)
POTASSIUM: 4.5 meq/L (ref 3.5–5.1)
SODIUM: 141 meq/L (ref 135–145)

## 2015-09-03 LAB — CBC WITH DIFFERENTIAL/PLATELET
Basophils Absolute: 0.1 10*3/uL (ref 0.0–0.1)
Basophils Relative: 0.7 % (ref 0.0–3.0)
EOS PCT: 1 % (ref 0.0–5.0)
Eosinophils Absolute: 0.1 10*3/uL (ref 0.0–0.7)
HEMATOCRIT: 27.8 % — AB (ref 39.0–52.0)
HEMOGLOBIN: 9.1 g/dL — AB (ref 13.0–17.0)
Lymphocytes Relative: 32.2 % (ref 12.0–46.0)
Lymphs Abs: 2.2 10*3/uL (ref 0.7–4.0)
MCHC: 32.7 g/dL (ref 30.0–36.0)
MCV: 99.5 fl (ref 78.0–100.0)
MONOS PCT: 5.5 % (ref 3.0–12.0)
Monocytes Absolute: 0.4 10*3/uL (ref 0.1–1.0)
Neutro Abs: 4.2 10*3/uL (ref 1.4–7.7)
Neutrophils Relative %: 60.6 % (ref 43.0–77.0)
Platelets: 76 10*3/uL — ABNORMAL LOW (ref 150.0–400.0)
RBC: 2.79 Mil/uL — AB (ref 4.22–5.81)
RDW: 20.2 % — ABNORMAL HIGH (ref 11.5–15.5)
WBC: 6.9 10*3/uL (ref 4.0–10.5)

## 2015-09-03 LAB — IBC PANEL
Iron: 87 ug/dL (ref 42–165)
Saturation Ratios: 31.4 % (ref 20.0–50.0)
Transferrin: 198 mg/dL — ABNORMAL LOW (ref 212.0–360.0)

## 2015-09-03 NOTE — Progress Notes (Signed)
Quick Note:  Please let patient know that he is significantly anemic without iron deficiency; would like him to see the hematologist he had seen before again. If he agrees will arrange consultation ______

## 2015-09-06 DIAGNOSIS — E039 Hypothyroidism, unspecified: Secondary | ICD-10-CM

## 2015-09-06 DIAGNOSIS — M7121 Synovial cyst of popliteal space [Baker], right knee: Secondary | ICD-10-CM | POA: Diagnosis not present

## 2015-09-06 DIAGNOSIS — K5730 Diverticulosis of large intestine without perforation or abscess without bleeding: Secondary | ICD-10-CM

## 2015-09-06 DIAGNOSIS — C61 Malignant neoplasm of prostate: Secondary | ICD-10-CM | POA: Diagnosis not present

## 2015-09-09 ENCOUNTER — Telehealth: Payer: Self-pay | Admitting: Endocrinology

## 2015-09-09 NOTE — Telephone Encounter (Signed)
Please see below and advise.

## 2015-09-09 NOTE — Telephone Encounter (Signed)
Noted, they are aware. 

## 2015-09-09 NOTE — Telephone Encounter (Signed)
Victor Minus CNA called stating that Victor Green was advised to take 1/2 tablet of lasix. The swelling is not coming down  Also, the patients blood pressure has been high  B/P: 163/78, 159/87 His output of urine has decreased    Please advise   Call back: [phone]  Thank you

## 2015-09-09 NOTE — Telephone Encounter (Signed)
Increase Lasix to 40 mg daily, this should help blood pressure

## 2015-09-12 ENCOUNTER — Telehealth: Payer: Self-pay | Admitting: Endocrinology

## 2015-09-12 NOTE — Telephone Encounter (Signed)
Haley with [facility] # [phone] There has been some changes recently increased drowsiness

## 2015-09-12 NOTE — Telephone Encounter (Signed)
Weekly weight

## 2015-09-12 NOTE — Telephone Encounter (Signed)
Nurse called and wants to know if you want a daily weight or weekly weight? As far as the message below, the nurse said she didn't feel that he was excessively drowsy, the aids were concerned the lasix was making him sleepy. She also said after the Lasix kicked in his blood pressure has returned to normal. Please advise on weight.

## 2015-09-13 NOTE — Telephone Encounter (Signed)
Nurse was informed.

## 2015-09-15 ENCOUNTER — Encounter (HOSPITAL_COMMUNITY): Payer: Self-pay

## 2015-09-15 ENCOUNTER — Emergency Department (HOSPITAL_COMMUNITY)
Admission: EM | Admit: 2015-09-15 | Discharge: 2015-09-15 | Disposition: A | Discharge status: Home / Self Care | Payer: 59 | Attending: Physician Assistant | Admitting: Physician Assistant

## 2015-09-15 ENCOUNTER — Emergency Department (HOSPITAL_COMMUNITY): Payer: 59

## 2015-09-15 DIAGNOSIS — H409 Unspecified glaucoma: Secondary | ICD-10-CM | POA: Diagnosis not present

## 2015-09-15 DIAGNOSIS — Z793 Long term (current) use of hormonal contraceptives: Secondary | ICD-10-CM | POA: Insufficient documentation

## 2015-09-15 DIAGNOSIS — Z8719 Personal history of other diseases of the digestive system: Secondary | ICD-10-CM | POA: Insufficient documentation

## 2015-09-15 DIAGNOSIS — R2243 Localized swelling, mass and lump, lower limb, bilateral: Secondary | ICD-10-CM | POA: Diagnosis present

## 2015-09-15 DIAGNOSIS — I10 Essential (primary) hypertension: Secondary | ICD-10-CM | POA: Diagnosis not present

## 2015-09-15 DIAGNOSIS — I2510 Atherosclerotic heart disease of native coronary artery without angina pectoris: Secondary | ICD-10-CM | POA: Insufficient documentation

## 2015-09-15 DIAGNOSIS — Z7951 Long term (current) use of inhaled steroids: Secondary | ICD-10-CM | POA: Insufficient documentation

## 2015-09-15 DIAGNOSIS — E785 Hyperlipidemia, unspecified: Secondary | ICD-10-CM | POA: Insufficient documentation

## 2015-09-15 DIAGNOSIS — M1990 Unspecified osteoarthritis, unspecified site: Secondary | ICD-10-CM | POA: Diagnosis not present

## 2015-09-15 DIAGNOSIS — J45901 Unspecified asthma with (acute) exacerbation: Secondary | ICD-10-CM | POA: Diagnosis not present

## 2015-09-15 DIAGNOSIS — R609 Edema, unspecified: Secondary | ICD-10-CM

## 2015-09-15 DIAGNOSIS — Z79899 Other long term (current) drug therapy: Secondary | ICD-10-CM | POA: Diagnosis not present

## 2015-09-15 DIAGNOSIS — E669 Obesity, unspecified: Secondary | ICD-10-CM | POA: Diagnosis not present

## 2015-09-15 LAB — URINALYSIS, ROUTINE W REFLEX MICROSCOPIC
BILIRUBIN URINE: NEGATIVE
Glucose, UA: NEGATIVE mg/dL
KETONES UR: NEGATIVE mg/dL
LEUKOCYTES UA: NEGATIVE
NITRITE: NEGATIVE
PROTEIN: NEGATIVE mg/dL
Specific Gravity, Urine: 1.015 (ref 1.005–1.030)
UROBILINOGEN UA: 0.2 mg/dL (ref 0.0–1.0)
pH: 6 (ref 5.0–8.0)

## 2015-09-15 LAB — CBC WITH DIFFERENTIAL/PLATELET
BASOS ABS: 0 10*3/uL (ref 0.0–0.1)
BASOS PCT: 0 %
EOS ABS: 0.1 10*3/uL (ref 0.0–0.7)
EOS PCT: 1 %
HCT: 32.2 % — ABNORMAL LOW (ref 39.0–52.0)
Hemoglobin: 9.9 g/dL — ABNORMAL LOW (ref 13.0–17.0)
Lymphocytes Relative: 25 %
Lymphs Abs: 2.3 10*3/uL (ref 0.7–4.0)
MCH: 31.6 pg (ref 26.0–34.0)
MCHC: 30.7 g/dL (ref 30.0–36.0)
MCV: 102.9 fL — ABNORMAL HIGH (ref 78.0–100.0)
MONO ABS: 0.5 10*3/uL (ref 0.1–1.0)
Monocytes Relative: 6 %
Neutro Abs: 6.5 10*3/uL (ref 1.7–7.7)
Neutrophils Relative %: 69 %
Platelets: 83 10*3/uL — ABNORMAL LOW (ref 150–400)
RBC: 3.13 MIL/uL — ABNORMAL LOW (ref 4.22–5.81)
RDW: 20.2 % — AB (ref 11.5–15.5)
WBC: 9.4 10*3/uL (ref 4.0–10.5)

## 2015-09-15 LAB — COMPREHENSIVE METABOLIC PANEL
ALBUMIN: 3.1 g/dL — AB (ref 3.5–5.0)
ALT: 20 U/L (ref 17–63)
ANION GAP: 7 (ref 5–15)
AST: 32 U/L (ref 15–41)
Alkaline Phosphatase: 64 U/L (ref 38–126)
BILIRUBIN TOTAL: 0.7 mg/dL (ref 0.3–1.2)
BUN: 35 mg/dL — AB (ref 6–20)
CO2: 27 mmol/L (ref 22–32)
Calcium: 8.5 mg/dL — ABNORMAL LOW (ref 8.9–10.3)
Chloride: 106 mmol/L (ref 101–111)
Creatinine, Ser: 1.03 mg/dL (ref 0.61–1.24)
GFR calc Af Amer: >60 mL/min (ref >60)
GFR calc non Af Amer: >60 mL/min (ref >60)
GLUCOSE: 93 mg/dL (ref 65–99)
POTASSIUM: 4.2 mmol/L (ref 3.5–5.1)
SODIUM: 140 mmol/L (ref 135–145)
TOTAL PROTEIN: 5.8 g/dL — AB (ref 6.5–8.1)

## 2015-09-15 LAB — PROTIME-INR
INR: 1.11 (ref 0.00–1.49)
PROTHROMBIN TIME: 14.5 s (ref 11.6–15.2)

## 2015-09-15 LAB — URINE MICROSCOPIC-ADD ON

## 2015-09-15 LAB — I-STAT TROPONIN, ED: Troponin i, poc: 0 ng/mL (ref 0.00–0.08)

## 2015-09-15 LAB — BRAIN NATRIURETIC PEPTIDE: B NATRIURETIC PEPTIDE 5: 221.4 pg/mL — AB (ref 0.0–100.0)

## 2015-09-15 MED ORDER — FUROSEMIDE 10 MG/ML IJ SOLN
40.0000 mg | Freq: Once | INTRAMUSCULAR | Status: AC
  Administered 2015-09-15: 40 mg via INTRAVENOUS
  Filled 2015-09-15: qty 4

## 2015-09-15 NOTE — ED Provider Notes (Addendum)
CSN: [id number]     Arrival date & time 09/15/15  1201 History   First MD Initiated Contact with Patient 09/15/15 1235     Chief Complaint  Patient presents with  . Leg Swelling    arm swelling     (Consider location/radiation/quality/duration/timing/severity/associated sxs/prior Treatment) HPI   Patient is a 81 year old male with past mental history significant for CAD hyperlipidemia aortic insufficiency CHF. Patient had recent hospitalization for acute renal failure attributed to Lasix use. Patient's Lasix was decreased at that time. Patient's presenting today with anasarca. Patient's got bilateral edema up to his knees bilaterally. And additionally he has edema in bilateral upper extremities to his elbow. Patient reports that he woke up this morning with these increasing edema and shortness of breath.  Past Medical History  Diagnosis Date  . Hypertension   . CAD (coronary artery disease) complete w/u in 2009    extensive coronary calcification by EBCT, normal myoview, echo with EF 70% mild AI  . Hyperlipidemia   . Aortic insufficiency     echo July 2011 EF 60%  . 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 History of GI bleed 2003    due to pyloric-positive ulcer   . Diverticulosis     with remote history of diverticulitis  . Plantar fasciitis     chronic  . Allergic rhinitis     responding well to desensitation injections  . Glaucoma   . Osteoarthritis     both knees  . Asthma    Past Surgical History  Procedure Laterality Date  . Tonsillectomy    . Hemorrhoid surgery  1996  . Hernia repair  1996    right  . Lumbar laminectomy  2003  . Cataract extraction      left eye   Family History  Problem Relation Age of Onset  . Thrombosis Father     mesenteric artery thrombosis after prostate surgery  . Colon cancer Mother    Social History  Substance Use Topics  . Smoking status: Never Smoker   . Smokeless tobacco: None  . Alcohol Use: No    Review  of Systems  Constitutional: Positive for activity change. Negative for fever.  Eyes: Negative for discharge and redness.  Respiratory: Positive for shortness of breath. Negative for cough.   Cardiovascular: Positive for leg swelling. Negative for chest pain.  Gastrointestinal: Negative for abdominal pain.  Genitourinary: Negative for dysuria.  Musculoskeletal: Negative for arthralgias.  Allergic/Immunologic: Negative for immunocompromised state.  Psychiatric/Behavioral: Negative for agitation.  All other systems reviewed and are negative.     Allergies  Other and Tetanus toxoids  Home Medications   Prior to Admission medications   Medication Sig Start Date End Date Taking? Authorizing Provider  acetaminophen (TYLENOL) 325 MG tablet Take 325 mg by mouth every morning.    Yes Historical Provider, MD  atorvastatin (LIPITOR) 10 MG tablet Take 1 tablet by mouth  daily Patient taking differently: Take 0.5  tablet by mouth daily at bedtime 07/09/15  Yes [name], MD  azelastine (ASTELIN) 0.1 % nasal spray Use 1 spray in each nostril two times daily as directed Patient taking differently: Use 1 spray in each nostril once daily 08/21/15  Yes [name], MD  B Complex-Biotin-FA (B-COMPLEX PO) Take 1 tablet by mouth daily.    Yes Historical Provider, MD  brimonidine (ALPHAGAN) 0.15 % ophthalmic solution Place 1 drop into the left eye 2 (two) times daily.  Yes Historical Provider, MD  dextromethorphan-guaiFENesin (MUCINEX DM) 30-600 MG per 12 hr tablet Take 1 tablet by mouth daily.    Yes Historical Provider, MD  diazepam (VALIUM) 2 MG tablet Take 1 tablet (2 mg total) by mouth daily. Patient taking differently: Take 1 mg by mouth at bedtime.  09/24/14  Yes [name], MD  diclofenac sodium (VOLTAREN) 1 % GEL Apply 3 times per day Patient taking differently: Apply 2 g topically daily as needed (for right knee). Apply 3 times per day 08/16/15  Yes [name], MD  dorzolamide (TRUSOPT) 2 %  ophthalmic solution Place 1 drop into the left eye 2 (two) times daily.    Yes Historical Provider, MD  dutasteride (AVODART) 0.5 MG capsule Take 1 capsule by mouth  daily Patient taking differently: Take 1 capsule by mouth  daily in evening 07/03/15  Yes [name], MD  EPIPEN 2-PAK 0.3 MG/0.3ML SOAJ injection Inject 0.40ml (0.3mg ) into  the muscle once as directed 12/26/14  Yes [name], MD  ferrous sulfate 325 (65 FE) MG tablet Take 325 mg by mouth daily with breakfast.   Yes Historical Provider, MD  fexofenadine (ALLEGRA) 180 MG tablet Take 90 mg by mouth every evening. 1/2 tablet once a day   Yes Historical Provider, MD  fluticasone (FLONASE) 50 MCG/ACT nasal spray Use 2 sprays in each  nostril twice daily 08/21/15  Yes [name], MD  furosemide (LASIX) 40 MG tablet Take 40 mg by mouth daily.  06/01/15  Yes Historical Provider, MD  glucosamine-chondroitin 500-400 MG tablet Take 1 tablet by mouth 2 (two) times daily.    Yes Historical Provider, MD  levothyroxine (SYNTHROID, LEVOTHROID) 50 MCG tablet Take 1 tablet by mouth  daily Patient taking differently: Take 0.5 tablet by mouth  daily 07/01/15  Yes [name], MD  metoprolol tartrate (LOPRESSOR) 25 MG tablet Take 0.5 tablets (12.5 mg total) by mouth 2 (two) times daily. 08/10/15  Yes Florencia Reasons, MD  Multiple Vitamin (MULTIVITAMIN) capsule Take 1 capsule by mouth daily.     Yes Historical Provider, MD  pantoprazole (PROTONIX) 40 MG tablet Take 1 tablet by mouth  daily 04/24/15  Yes [name], MD  potassium chloride (K-DUR) 10 MEQ tablet Take 2 tablets (20 mEq total) by mouth daily. 08/12/15  Yes [name], MD  Probiotic Product (ALIGN) 4 MG CAPS Take 4 mg by mouth every evening.   Yes Historical Provider, MD  azelastine (ASTELIN) 0.1 % nasal spray Place 1 spray into both nostrils 2 (two) times daily. Use in each nostril as directed Patient taking differently: Place 1 spray into both nostrils at bedtime. Use in each nostril as directed 08/20/14    [name], MD   BP 167/72 mmHg  Pulse 60  Temp(Src) 98.2 F (36.8 C) (Oral)  Resp 15  SpO2 94% Physical Exam  Constitutional: He is oriented to person, place, and time. He appears well-nourished.  Obese 81 year old male.  HENT:  Head: Normocephalic.  Mouth/Throat: Oropharynx is clear and moist.  Eyes: Conjunctivae are normal.  Neck: No tracheal deviation present.  Cardiovascular: Normal rate.   Pulmonary/Chest: Effort normal. No stridor. No respiratory distress.  Patient's pulse ox dips to 90% when rearranging himself in the bed.  Abdominal: Soft. There is no tenderness. There is no guarding.  Musculoskeletal: Normal range of motion. He exhibits edema.  Bilateral lower extremity edema +3 pitting to bilateral knees. Also +3 pitting edema to bilateral upper extremities.  Neurological: He is oriented to person,  place, and time. No cranial nerve deficit.  Mild dementia.  Skin: Skin is warm and dry. No rash noted. He is not diaphoretic.  Psychiatric: He has a normal mood and affect. His behavior is normal.  Nursing note and vitals reviewed.   ED Course  Procedures (including critical care time) Labs Review Labs Reviewed  CBC WITH DIFFERENTIAL/PLATELET - Abnormal; Notable for the following:    RBC 3.13 (*)    Hemoglobin 9.9 (*)    HCT 32.2 (*)    MCV 102.9 (*)    RDW 20.2 (*)    Platelets 83 (*)    All other components within normal limits  COMPREHENSIVE METABOLIC PANEL - Abnormal; Notable for the following:    BUN 35 (*)    Calcium 8.5 (*)    Total Protein 5.8 (*)    Albumin 3.1 (*)    All other components within normal limits  URINALYSIS, ROUTINE W REFLEX MICROSCOPIC (NOT AT [facility]) - Abnormal; Notable for the following:    Hgb urine dipstick TRACE (*)    All other components within normal limits  BRAIN NATRIURETIC PEPTIDE - Abnormal; Notable for the following:    B Natriuretic Peptide 221.4 (*)    All other components within normal limits  URINE CULTURE  URINE  MICROSCOPIC-ADD ON  PROTIME-INR  Randolm Idol, ED    Imaging Review Dg Chest 2 View  09/15/2015  CLINICAL DATA:  Upper and lower extremity swelling beginning this morning. No chest complaints. EXAM: CHEST  2 VIEW COMPARISON:  PA and lateral chest 11/22/2012. FINDINGS: The lungs are clear. Heart size is upper normal. There is very small bilateral pleural effusions. The lungs are clear without evidence of pulmonary edema. Asymmetric elevation of the right hemidiaphragm is noted. IMPRESSION: Very small bilateral pleural effusions. Negative for pulmonary edema. Atherosclerosis. Electronically Signed   By: Inge Rise M.D.   On: 09/15/2015 14:51   I have personally reviewed and evaluated these images and lab results as part of my medical decision-making.   EKG Interpretation   Date/Time:  Sunday September 15 2015 14:04:27 EST Ventricular Rate:  61 PR Interval:    QRS Duration: 141 QT Interval:  450 QTC Calculation: 453 R Axis:   -53 Text Interpretation:  Junctional rhythm Right bundle branch block  Inferolateral infarct, old ED PHYSICIAN INTERPRETATION AVAILABLE IN [place]  [place] Confirmed by TEST, Record ([id number]) on 09/15/2015 2:23:02 PM      MDM   Final diagnoses:  None    Patient is a 81 year old male with baseline mild dementia reliant on wheelchair to get around and full-time caregiver. He is presenting today with worsening edema. Patient's got bilateral upper and lower extremity edema that is new for him. Recent admission decreased his Lasix due to acute renal failure. However today he appears to be fluid overloaded. He's got crackles on the left lower lung base, bilateral extremity edema. Patient is a nonmobile at home.He is 93% on room air here.  3:40 PM I initially wanted to admit patient for Lasix,  [facility].  They noted echo showed EF 65%, concernd that the edema was due to low albumin state. They recommended one dose IV lasix, then follow up  with PCP tomorrow.  I think this is a reasonable option.   3:58 PM Family agrees with plan and knows return precautions.    Courteney Julio Alm, MD 09/15/15 [place], MD 09/15/15 [id number]

## 2015-09-15 NOTE — ED Notes (Signed)
Pt. Lives at home by himself,. Has home health 24 hours. They report increase in edema to extremities.  Pt. Denies any chest pain, sob , nausea , vomiting.  Alert and oriented X4   Skin is p/w/d/

## 2015-09-15 NOTE — Discharge Instructions (Signed)
We are unsure what is causing edema. We need you to follow up with her primary care physician tomorrow.   Edema Edema is an abnormal buildup of fluids in your bodytissues. Edema is somewhatdependent on gravity to pull the fluid to the lowest place in your body. That makes the condition more common in the legs and thighs (lower extremities). Painless swelling of the feet and ankles is common and becomes more likely as you get older. It is also common in looser tissues, like around your eyes.  When the affected area is squeezed, the fluid may move out of that spot and leave a dent for a few moments. This dent is called pitting.  CAUSES  There are many possible causes of edema. Eating too much salt and being on your feet or sitting for a long time can cause edema in your legs and ankles. Hot weather may make edema worse. Common medical causes of edema include:  Heart failure.  Liver disease.  Kidney disease.  Weak blood vessels in your legs.  Cancer.  An injury.  Pregnancy.  Some medications.  Obesity. SYMPTOMS  Edema is usually painless.Your skin may look swollen or shiny.  DIAGNOSIS  Your health care provider may be able to diagnose edema by asking about your medical history and doing a physical exam. You may need to have tests such as X-rays, an electrocardiogram, or blood tests to check for medical conditions that may cause edema.  TREATMENT  Edema treatment depends on the cause. If you have heart, liver, or kidney disease, you need the treatment appropriate for these conditions. General treatment may include:  Elevation of the affected body part above the level of your heart.  Compression of the affected body part. Pressure from elastic bandages or support stockings squeezes the tissues and forces fluid back into the blood vessels. This keeps fluid from entering the tissues.  Restriction of fluid and salt intake.  Use of a water pill (diuretic). These medications are  appropriate only for some types of edema. They pull fluid out of your body and make you urinate more often. This gets rid of fluid and reduces swelling, but diuretics can have side effects. Only use diuretics as directed by your health care provider. HOME CARE INSTRUCTIONS   Keep the affected body part above the level of your heart when you are lying down.   Do not sit still or stand for prolonged periods.   Do not put anything directly under your knees when lying down.  Do not wear constricting clothing or garters on your upper legs.   Exercise your legs to work the fluid back into your blood vessels. This may help the swelling go down.   Wear elastic bandages or support stockings to reduce ankle swelling as directed by your health care provider.   Eat a low-salt diet to reduce fluid if your health care provider recommends it.   Only take medicines as directed by your health care provider. SEEK MEDICAL CARE IF:   Your edema is not responding to treatment.  You have heart, liver, or kidney disease and notice symptoms of edema.  You have edema in your legs that does not improve after elevating them.   You have sudden and unexplained weight gain. SEEK IMMEDIATE MEDICAL CARE IF:   You develop shortness of breath or chest pain.   You cannot breathe when you lie down.  You develop pain, redness, or warmth in the swollen areas.   You have heart, liver,  or kidney disease and suddenly get edema.  You have a fever and your symptoms suddenly get worse. MAKE SURE YOU:   Understand these instructions.  Will watch your condition.  Will get help right away if you are not doing well or get worse.   This information is not intended to replace advice given to you by your health care provider. Make sure you discuss any questions you have with your health care provider.   Document Released: 10/19/2005 Document Revised: 11/09/2014 Document Reviewed: 08/11/2013 Elsevier  Interactive Patient Education [employer].

## 2015-09-15 NOTE — ED Notes (Signed)
Pt awaiting [facility].  Carmelina Paddock, RN going over discharge paperwork with family.

## 2015-09-15 NOTE — ED Notes (Signed)
Notified [facility] of transportation back home

## 2015-09-15 NOTE — ED Notes (Signed)
Transported home by [facility] 

## 2015-09-16 ENCOUNTER — Ambulatory Visit (INDEPENDENT_AMBULATORY_CARE_PROVIDER_SITE_OTHER): Payer: 59 | Admitting: Endocrinology

## 2015-09-16 VITALS — BP 148/88 | HR 76 | Resp 18 | Ht 69.0 in

## 2015-09-16 DIAGNOSIS — R601 Generalized edema: Secondary | ICD-10-CM | POA: Diagnosis not present

## 2015-09-16 DIAGNOSIS — M25561 Pain in right knee: Secondary | ICD-10-CM | POA: Diagnosis not present

## 2015-09-16 DIAGNOSIS — R627 Adult failure to thrive: Secondary | ICD-10-CM | POA: Diagnosis not present

## 2015-09-16 LAB — URINE CULTURE

## 2015-09-16 MED ORDER — TRAMADOL HCL 50 MG PO TABS: ORAL_TABLET | ORAL | Status: DC

## 2015-09-16 NOTE — Telephone Encounter (Signed)
Patient Name: Victor Green Gender: Male DOB: 1919-11-16 Age: 81 Y 46 M 7 D Return Phone Number: [phone] (Primary) Address: City/State/Zip: [place] Client [facility] Night - Client Client [facility] Physician [name] Contact Type Call Call Type Triage / Clinical Caller Name Noreene Larsson Relationship To Patient Care Giver Return Phone Number [phone] (Primary) Chief Complaint Leg Swelling And Edema Initial Comment Caller States the patient is having issues with swelling, swollen feet, legs and arms, urine is dark PreDisposition Call Doctor Nurse Assessment Nurse: Vevelyn Royals, RN, Verdis Frederickson Date/Time Eilene Ghazi Time): 09/15/2015 10:19:06 AM Confirm and document reason for call. If symptomatic, describe symptoms. ---Caller States the patient is having issues with swelling, swollen feet, legs and arms, urine is dark. Caller states she is CNA, not with patient, he called her. CNA's are with him now. Caller states she has written permission to speak with nurse. [phone] ask to speak with Ala, CNA. Patient gives permission to speak with Noreene Larsson and Ala

## 2015-09-16 NOTE — Telephone Encounter (Signed)
He has an appointment today

## 2015-09-16 NOTE — Progress Notes (Signed)
Patient ID: Victor Green, male   DOB: December 13, 1919, 81 y.o.   MRN: [id number]   Chief complaint: Review of multiple problems, post ER visit  History of Present Illness:   1.   Generalized edema: he has had lower leg edema which has been persistent for a couple of years  Etiology is unclear as he has had no systemic disease or renal insufficiency. It has been felt to be  from venous insufficiency previously  On his last visit he was found to have generalized edema probably related to low albumin levels Because of tendency to getting low blood pressure with increase diuresis he was told to take only 20 mg Lasix This was increased a week ago to 40 mg daily because of reported persistent edema and decreased urine output  This study was taken to the ER because of continued edema and decreased urine output However he was not felt to be in CHF and was given IV Lasix, unknown dose and renal function was normal also  Unable to check his weight today He does not complain of shortness of breath but his caretaker feels that he may look out of breath if he is trying to be more active  Wt Readings from Last 3 Encounters:  09/02/15 207 lb (93.895 kg)  08/09/15 171 lb 4.8 oz (77.7 kg)  07/17/15 194 lb 9.6 oz (88.27 kg)     2. History of hypertension: Previously  had hypertension with mild hyperaldosteronism but has not had any hypokalemia.  Currently not requiring any antihypertensive medications   3. Anemia: Hemoglobin has been persistently low His last iron level was normal.  Ferritin previously low normal at 38 and he is taking iron supplements; previously B12 was normal   Hematology evaluation previously was negative and no further follow-up recommended  Also continues to have persistent thrombocytopenia of unclear etiology, no bleeding tendencies   Lab Results  Component Value Date   WBC 9.4 09/15/2015   HGB 9.9* 09/15/2015   HCT 32.2* 09/15/2015   MCV 102.9* 09/15/2015   PLT 83* 09/15/2015    4.. Hypercholesterolemia: Mild and well-controlled with low dose Lipitor, tends to have low HDL.   Lab Results  Component Value Date   CHOL 111 07/17/2015   HDL 51.70 07/17/2015   LDLCALC 47 07/17/2015   TRIG 65.0 07/17/2015   CHOLHDL 2 07/17/2015     5.   Hypothyroidism.  He has been on low-dose supplementation with 25 g again since his TSH had gone up to 8.1 in 2/16 with stopping his thyroid supplement in 9/15 He does not feel any different with starting the thyroid supplement including any changes in concentration or memory Recent thyroid level has been normal   Lab Results  Component Value Date   TSH 2.571 08/10/2015   TSH 6.19* 07/17/2015   TSH 3.82 03/18/2015   FREET4 0.96 07/17/2015   FREET4 0.79 03/18/2015   FREET4 0.95 12/05/2014           Medication List       This list is accurate as of: 09/16/15  8:52 PM.  Always use your most recent med list.               acetaminophen 325 MG tablet  Commonly known as:  TYLENOL  Take 325 mg by mouth every morning.     ALIGN 4 MG Caps  Take 4 mg by mouth every evening.     atorvastatin 10 MG tablet  Commonly known  as:  LIPITOR  Take 1 tablet by mouth  daily     azelastine 0.1 % nasal spray  Commonly known as:  ASTELIN  Place 1 spray into both nostrils 2 (two) times daily. Use in each nostril as directed     azelastine 0.1 % nasal spray  Commonly known as:  ASTELIN  Use 1 spray in each nostril two times daily as directed     B-COMPLEX PO  Take 1 tablet by mouth daily.     brimonidine 0.15 % ophthalmic solution  Commonly known as:  ALPHAGAN  Place 1 drop into the left eye 2 (two) times daily.     dextromethorphan-guaiFENesin 30-600 MG 12hr tablet  Commonly known as:  MUCINEX DM  Take 1 tablet by mouth daily.     diazepam 2 MG tablet  Commonly known as:  VALIUM  Take 1 tablet (2 mg total) by mouth daily.     diclofenac sodium 1 % Gel  Commonly known as:  VOLTAREN  Apply 3  times per day     dorzolamide 2 % ophthalmic solution  Commonly known as:  TRUSOPT  Place 1 drop into the left eye 2 (two) times daily.     dutasteride 0.5 MG capsule  Commonly known as:  AVODART  Take 1 capsule by mouth  daily     EPIPEN 2-PAK 0.3 mg/0.3 mL Soaj injection  Generic drug:  EPINEPHrine  Inject 0.68ml (0.3mg ) into  the muscle once as directed     ferrous sulfate 325 (65 FE) MG tablet  Take 325 mg by mouth daily with breakfast.     fexofenadine 180 MG tablet  Commonly known as:  ALLEGRA  Take 90 mg by mouth every evening. 1/2 tablet once a day     fluticasone 50 MCG/ACT nasal spray  Commonly known as:  FLONASE  Use 2 sprays in each  nostril twice daily     furosemide 40 MG tablet  Commonly known as:  LASIX  Take 40 mg by mouth daily.     glucosamine-chondroitin 500-400 MG tablet  Take 1 tablet by mouth 2 (two) times daily.     levothyroxine 50 MCG tablet  Commonly known as:  SYNTHROID, LEVOTHROID  Take 1 tablet by mouth  daily     metoprolol tartrate 25 MG tablet  Commonly known as:  LOPRESSOR  Take 0.5 tablets (12.5 mg total) by mouth 2 (two) times daily.     multivitamin capsule  Take 1 capsule by mouth daily.     pantoprazole 40 MG tablet  Commonly known as:  PROTONIX  Take 1 tablet by mouth  daily     potassium chloride 10 MEQ tablet  Commonly known as:  K-DUR  Take 2 tablets (20 mEq total) by mouth daily.     traMADol 50 MG tablet  Commonly known as:  ULTRAM  Half to 1 tablet with food up to 3 times a day for pain        Allergies:  Allergies  Allergen Reactions  . Other Anaphylaxis    Horse serum  . Tetanus Toxoids Other (See Comments)    Past Medical History  Diagnosis Date  . Hypertension   . CAD (coronary artery disease) complete w/u in 2009    extensive coronary calcification by EBCT, normal myoview, echo with EF 70% mild AI  . Hyperlipidemia   . Aortic insufficiency     echo July 2011 EF 60%  . 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 History of GI bleed 2003    due to pyloric-positive ulcer   . Diverticulosis     with remote history of diverticulitis  . Plantar fasciitis     chronic  . Allergic rhinitis     responding well to desensitation injections  . Glaucoma   . Osteoarthritis     both knees  . Asthma     Past Surgical History  Procedure Laterality Date  . Tonsillectomy    . Hemorrhoid surgery  1996  . Hernia repair  1996    right  . Lumbar laminectomy  2003  . Cataract extraction      left eye    Family History  Problem Relation Age of Onset  . Thrombosis Father     mesenteric artery thrombosis after prostate surgery  . Colon cancer Mother     Social History:  reports that he has never smoked. He does not have any smokeless tobacco history on file. He reports that he does not drink alcohol or use illicit drugs.  Review of Systems -    DECREASED appetite: Before and after his hospitalization he was eating poorly However he is now getting 24 hour care at home and he is eating much better and apparently his attendant says that he is eating 3 full meals now  JOINT pain: He still has significant joint pain on the right knee and has not seen the orthopedic surgeon yet He was tried on diclofenac gel which is helping him somewhat.     EXAM:  BP 148/88 mmHg  Pulse 76  Resp 18  Ht 5\' 9"  (1.753 m)  Wt   SpO2 84%  Repeat blood pressure left arm was 122/70 and pulse oximetry was 90% He has swelling of his arms and legs with 1-2+ lower leg edema Has mild skin edema of the lower back in lower abdomen His lungs show mild gross crepitation at the bases, no wheezing  Heart sounds are normal without murmurs or gallops No abdominal mass or tenderness    Assessment/Plan:   EDEMA: This is more difficult to control and  appearing to be related to hypoalbuminemia with his albumin level being significantly low even on his labs yesterday However may have borderline cardiac  function with mild increase in BNP yesterday  Since his blood pressure is adequate and he is not getting diuresis from 40 mg Lasix at this time for the last week he can increase the dose to 80 mg for the next few days Also his caretaker was informed about letting us know if blood pressure is below [id number] systolic Will need to watch his renal function and potassium with weekly lab draws using the home nurse  OSTEOARTHRITIS of the knee.  He is going to see orthopedic surgeon for steroid injection, will continue topical diclofenac gel and also gave him 25-50 mg tramadol as needed  Persistent anemia and thrombocytopenia of unclear etiology : CBC to be checked in follow-up  Malnutrition: His intake is  better now and will periodically check his serum albumin level  General care: Will get consultation from geriatric physician, would prefer to transfer him to their care for general management and since he probably does not have a good prognosis at this stage   Patient Instructions  Fluid pill 2 in am daily  Call if Bp goes <110 on top  Tramadol 1/2 -1 pill as needed 3x for pain    Total visit time for review  of hospital records, multiple medical issues, exam, labs = 25 minutes   Terril Amaro

## 2015-09-16 NOTE — Telephone Encounter (Signed)
He got intravenous Lasix at the emergency room. If he is still having a lot of swelling he can take 40 mg Lasix, 2 tablets daily until swelling improved but will need to be seen at the end of the week for review of his blood pressure

## 2015-09-16 NOTE — Patient Instructions (Addendum)
Fluid pill 2 in am daily  Call if Bp goes <110 on top  Tramadol 1/2 -1 pill as needed 3x for pain

## 2015-09-16 NOTE — Telephone Encounter (Signed)
Please see below.

## 2015-09-18 ENCOUNTER — Encounter: Payer: Self-pay | Admitting: Endocrinology

## 2015-09-18 ENCOUNTER — Other Ambulatory Visit: Payer: Self-pay | Admitting: *Deleted

## 2015-09-18 ENCOUNTER — Telehealth: Payer: Self-pay | Admitting: Endocrinology

## 2015-09-18 MED ORDER — FUROSEMIDE 40 MG PO TABS: ORAL_TABLET | ORAL | Status: DC

## 2015-09-18 NOTE — Telephone Encounter (Signed)
Spoke with CNA for Victor Green, explained the Lasix to the CNA, repeated instructions multiple times, there was confusion because they had a rx that we did not call in for him. Please call in the correct rx to [facility] for [facility] pt stating 40mg  full tab normally and ok to take 2 full tabs when swelling is high. Thank you!

## 2015-09-18 NOTE — Telephone Encounter (Signed)
rx was sent in to [facility]

## 2015-09-19 ENCOUNTER — Other Ambulatory Visit: Payer: Self-pay | Admitting: *Deleted

## 2015-09-19 ENCOUNTER — Telehealth: Payer: Self-pay | Admitting: Endocrinology

## 2015-09-19 NOTE — Telephone Encounter (Signed)
That's what the nurse is going to tell them.

## 2015-09-19 NOTE — Telephone Encounter (Signed)
No need to restrict fluid unless he is drinking excessive amounts of water.  Need to see how his 80 mg Lasix dose works

## 2015-09-19 NOTE — Telephone Encounter (Signed)
Haylee nurse with [facility] calling Rhonda # [phone] regarding assessment from yesterday

## 2015-09-19 NOTE — Telephone Encounter (Signed)
He should drink fluids only as desired and nothing extra

## 2015-09-19 NOTE — Telephone Encounter (Signed)
Home health nurse called, she wants to know if you can put a limit on the amount of fluids per day he takes. He was extremely swollen when she went out there yesterday. Please advise.

## 2015-09-19 NOTE — Telephone Encounter (Signed)
Noted, home health nurse is aware, she said they are pushing a lot of fluid (about 150) per hour.

## 2015-09-20 ENCOUNTER — Observation Stay (HOSPITAL_COMMUNITY)
Admission: EM | Admit: 2015-09-20 | Discharge: 2015-09-22 | Disposition: A | Discharge status: Home / Self Care | Payer: 59 | Attending: Internal Medicine | Admitting: Internal Medicine

## 2015-09-20 ENCOUNTER — Telehealth (HOSPITAL_COMMUNITY): Payer: Self-pay

## 2015-09-20 ENCOUNTER — Encounter (HOSPITAL_COMMUNITY): Payer: Self-pay | Admitting: Nurse Practitioner

## 2015-09-20 ENCOUNTER — Emergency Department (HOSPITAL_COMMUNITY): Payer: 59

## 2015-09-20 DIAGNOSIS — D696 Thrombocytopenia, unspecified: Secondary | ICD-10-CM | POA: Diagnosis not present

## 2015-09-20 DIAGNOSIS — I5033 Acute on chronic diastolic (congestive) heart failure: Secondary | ICD-10-CM | POA: Diagnosis present

## 2015-09-20 DIAGNOSIS — Z8719 Personal history of other diseases of the digestive system: Secondary | ICD-10-CM | POA: Diagnosis not present

## 2015-09-20 DIAGNOSIS — I2510 Atherosclerotic heart disease of native coronary artery without angina pectoris: Secondary | ICD-10-CM | POA: Diagnosis not present

## 2015-09-20 DIAGNOSIS — I371 Nonrheumatic pulmonary valve insufficiency: Secondary | ICD-10-CM

## 2015-09-20 DIAGNOSIS — Z887 Allergy status to serum and vaccine status: Secondary | ICD-10-CM | POA: Insufficient documentation

## 2015-09-20 DIAGNOSIS — E785 Hyperlipidemia, unspecified: Secondary | ICD-10-CM | POA: Insufficient documentation

## 2015-09-20 DIAGNOSIS — I10 Essential (primary) hypertension: Secondary | ICD-10-CM | POA: Diagnosis present

## 2015-09-20 DIAGNOSIS — E876 Hypokalemia: Secondary | ICD-10-CM | POA: Diagnosis not present

## 2015-09-20 DIAGNOSIS — N400 Enlarged prostate without lower urinary tract symptoms: Secondary | ICD-10-CM | POA: Diagnosis not present

## 2015-09-20 DIAGNOSIS — I440 Atrioventricular block, first degree: Secondary | ICD-10-CM | POA: Insufficient documentation

## 2015-09-20 DIAGNOSIS — J45909 Unspecified asthma, uncomplicated: Secondary | ICD-10-CM | POA: Insufficient documentation

## 2015-09-20 DIAGNOSIS — I50810 Right heart failure, unspecified: Secondary | ICD-10-CM

## 2015-09-20 DIAGNOSIS — Z888 Allergy status to other drugs, medicaments and biological substances status: Secondary | ICD-10-CM | POA: Diagnosis not present

## 2015-09-20 DIAGNOSIS — I351 Nonrheumatic aortic (valve) insufficiency: Secondary | ICD-10-CM | POA: Diagnosis not present

## 2015-09-20 DIAGNOSIS — J90 Pleural effusion, not elsewhere classified: Secondary | ICD-10-CM | POA: Diagnosis not present

## 2015-09-20 DIAGNOSIS — E039 Hypothyroidism, unspecified: Secondary | ICD-10-CM | POA: Insufficient documentation

## 2015-09-20 DIAGNOSIS — I110 Hypertensive heart disease with heart failure: Secondary | ICD-10-CM | POA: Diagnosis not present

## 2015-09-20 DIAGNOSIS — R601 Generalized edema: Secondary | ICD-10-CM

## 2015-09-20 DIAGNOSIS — M170 Bilateral primary osteoarthritis of knee: Secondary | ICD-10-CM | POA: Diagnosis not present

## 2015-09-20 DIAGNOSIS — I4510 Unspecified right bundle-branch block: Secondary | ICD-10-CM | POA: Diagnosis not present

## 2015-09-20 DIAGNOSIS — H409 Unspecified glaucoma: Secondary | ICD-10-CM | POA: Diagnosis not present

## 2015-09-20 DIAGNOSIS — R05 Cough: Secondary | ICD-10-CM | POA: Diagnosis present

## 2015-09-20 LAB — CBC
HCT: 31.7 % — ABNORMAL LOW (ref 39.0–52.0)
Hemoglobin: 9.9 g/dL — ABNORMAL LOW (ref 13.0–17.0)
MCH: 31.9 pg (ref 26.0–34.0)
MCHC: 31.2 g/dL (ref 30.0–36.0)
MCV: 102.3 fL — ABNORMAL HIGH (ref 78.0–100.0)
PLATELETS: 78 10*3/uL — AB (ref 150–400)
RBC: 3.1 MIL/uL — ABNORMAL LOW (ref 4.22–5.81)
RDW: 19.3 % — AB (ref 11.5–15.5)
WBC: 5.7 10*3/uL (ref 4.0–10.5)

## 2015-09-20 LAB — BASIC METABOLIC PANEL
Anion gap: 12 (ref 5–15)
BUN: 38 mg/dL — AB (ref 6–20)
CALCIUM: 8.6 mg/dL — AB (ref 8.9–10.3)
CHLORIDE: 102 mmol/L (ref 101–111)
CO2: 28 mmol/L (ref 22–32)
CREATININE: 1.13 mg/dL (ref 0.61–1.24)
GFR, EST NON AFRICAN AMERICAN: 54 mL/min — AB (ref >60)
Glucose, Bld: 131 mg/dL — ABNORMAL HIGH (ref 65–99)
Potassium: 3.7 mmol/L (ref 3.5–5.1)
Sodium: 142 mmol/L (ref 135–145)

## 2015-09-20 LAB — I-STAT TROPONIN, ED: TROPONIN I, POC: 0 ng/mL (ref 0.00–0.08)

## 2015-09-20 LAB — BRAIN NATRIURETIC PEPTIDE: B NATRIURETIC PEPTIDE 5: 443 pg/mL — AB (ref 0.0–100.0)

## 2015-09-20 MED ORDER — ONDANSETRON HCL 4 MG/2ML IJ SOLN: 4.0000 mg | Freq: Four times a day (QID) | INTRAMUSCULAR | Status: DC | PRN

## 2015-09-20 MED ORDER — DUTASTERIDE 0.5 MG PO CAPS
0.5000 mg | ORAL_CAPSULE | Freq: Every day | ORAL | Status: DC
  Administered 2015-09-21 (×2): 0.5 mg via ORAL
  Filled 2015-09-20 (×4): qty 1

## 2015-09-20 MED ORDER — DIAZEPAM 2 MG PO TABS
1.0000 mg | ORAL_TABLET | Freq: Every day | ORAL | Status: DC
  Administered 2015-09-20 – 2015-09-21 (×2): 1 mg via ORAL
  Filled 2015-09-20 (×2): qty 1

## 2015-09-20 MED ORDER — PANTOPRAZOLE SODIUM 40 MG PO TBEC
40.0000 mg | DELAYED_RELEASE_TABLET | Freq: Every day | ORAL | Status: DC
  Administered 2015-09-20 – 2015-09-22 (×3): 40 mg via ORAL
  Filled 2015-09-20 (×3): qty 1

## 2015-09-20 MED ORDER — NITROGLYCERIN 2 % TD OINT
0.5000 [in_us] | TOPICAL_OINTMENT | Freq: Once | TRANSDERMAL | Status: AC
  Administered 2015-09-20: 0.5 [in_us] via TOPICAL
  Filled 2015-09-20: qty 1

## 2015-09-20 MED ORDER — ACETAMINOPHEN 325 MG PO TABS: 650.0000 mg | ORAL_TABLET | ORAL | Status: DC | PRN

## 2015-09-20 MED ORDER — METOPROLOL TARTRATE 12.5 MG HALF TABLET
12.5000 mg | ORAL_TABLET | Freq: Two times a day (BID) | ORAL | Status: DC
  Administered 2015-09-20 – 2015-09-22 (×4): 12.5 mg via ORAL
  Filled 2015-09-20 (×4): qty 1

## 2015-09-20 MED ORDER — ATORVASTATIN CALCIUM 10 MG PO TABS
5.0000 mg | ORAL_TABLET | Freq: Every day | ORAL | Status: DC
  Administered 2015-09-20 – 2015-09-21 (×2): 5 mg via ORAL
  Filled 2015-09-20 (×2): qty 1

## 2015-09-20 MED ORDER — FUROSEMIDE 10 MG/ML IJ SOLN
40.0000 mg | Freq: Once | INTRAMUSCULAR | Status: AC
  Administered 2015-09-20: 40 mg via INTRAVENOUS
  Filled 2015-09-20: qty 4

## 2015-09-20 MED ORDER — FLUTICASONE PROPIONATE 50 MCG/ACT NA SUSP
2.0000 | Freq: Every day | NASAL | Status: DC
  Administered 2015-09-20 – 2015-09-22 (×3): 2 via NASAL
  Filled 2015-09-20: qty 16

## 2015-09-20 MED ORDER — DORZOLAMIDE HCL 2 % OP SOLN
1.0000 [drp] | Freq: Two times a day (BID) | OPHTHALMIC | Status: DC
  Administered 2015-09-20 – 2015-09-22 (×4): 1 [drp] via OPHTHALMIC
  Filled 2015-09-20: qty 10

## 2015-09-20 MED ORDER — ACETAMINOPHEN 325 MG PO TABS
325.0000 mg | ORAL_TABLET | Freq: Every morning | ORAL | Status: DC
  Administered 2015-09-21 – 2015-09-22 (×2): 325 mg via ORAL
  Filled 2015-09-20 (×2): qty 1

## 2015-09-20 MED ORDER — TRAMADOL HCL 50 MG PO TABS: 25.0000 mg | ORAL_TABLET | Freq: Three times a day (TID) | ORAL | Status: DC | PRN

## 2015-09-20 MED ORDER — FUROSEMIDE 10 MG/ML IJ SOLN
40.0000 mg | Freq: Two times a day (BID) | INTRAMUSCULAR | Status: DC
  Administered 2015-09-21: 40 mg via INTRAVENOUS
  Filled 2015-09-20: qty 4

## 2015-09-20 MED ORDER — SODIUM CHLORIDE 0.9 % IV SOLN: 250.0000 mL | INTRAVENOUS | Status: DC | PRN

## 2015-09-20 MED ORDER — RISAQUAD PO CAPS
1.0000 | ORAL_CAPSULE | Freq: Every evening | ORAL | Status: DC
  Administered 2015-09-20 – 2015-09-21 (×2): 1 via ORAL
  Filled 2015-09-20 (×3): qty 1

## 2015-09-20 MED ORDER — SODIUM CHLORIDE 0.9 % IJ SOLN: 3.0000 mL | INTRAMUSCULAR | Status: DC | PRN

## 2015-09-20 MED ORDER — SODIUM CHLORIDE 0.9 % IJ SOLN
3.0000 mL | Freq: Two times a day (BID) | INTRAMUSCULAR | Status: DC
  Administered 2015-09-20 – 2015-09-21 (×3): 3 mL via INTRAVENOUS

## 2015-09-20 MED ORDER — FERROUS SULFATE 325 (65 FE) MG PO TABS
325.0000 mg | ORAL_TABLET | Freq: Every day | ORAL | Status: DC
  Administered 2015-09-21 – 2015-09-22 (×2): 325 mg via ORAL
  Filled 2015-09-20 (×2): qty 1

## 2015-09-20 MED ORDER — BRIMONIDINE TARTRATE 0.2 % OP SOLN
1.0000 [drp] | Freq: Two times a day (BID) | OPHTHALMIC | Status: DC
  Administered 2015-09-20 – 2015-09-22 (×4): 1 [drp] via OPHTHALMIC
  Filled 2015-09-20: qty 5

## 2015-09-20 MED ORDER — ADULT MULTIVITAMIN W/MINERALS CH
1.0000 | ORAL_TABLET | Freq: Every day | ORAL | Status: DC
  Administered 2015-09-21 – 2015-09-22 (×2): 1 via ORAL
  Filled 2015-09-20 (×2): qty 1

## 2015-09-20 MED ORDER — AZELASTINE HCL 0.1 % NA SOLN
1.0000 | Freq: Every day | NASAL | Status: DC
  Administered 2015-09-20 – 2015-09-21 (×2): 1 via NASAL
  Filled 2015-09-20: qty 30

## 2015-09-20 MED ORDER — LORATADINE 10 MG PO TABS
10.0000 mg | ORAL_TABLET | Freq: Every day | ORAL | Status: DC
  Administered 2015-09-20 – 2015-09-22 (×3): 10 mg via ORAL
  Filled 2015-09-20 (×3): qty 1

## 2015-09-20 MED ORDER — POTASSIUM CHLORIDE ER 10 MEQ PO TBCR
20.0000 meq | EXTENDED_RELEASE_TABLET | Freq: Every day | ORAL | Status: DC
  Administered 2015-09-21: 20 meq via ORAL
  Filled 2015-09-20 (×2): qty 2

## 2015-09-20 MED ORDER — LEVOTHYROXINE SODIUM 25 MCG PO TABS: 25.0000 ug | ORAL_TABLET | Freq: Every day | ORAL | Status: DC

## 2015-09-20 NOTE — Telephone Encounter (Signed)
Go to ER

## 2015-09-20 NOTE — Telephone Encounter (Signed)
Patient son stated that his Dad b/p 199/98, having headaches, having a hard time using the bathroom. Please advise on what to do next

## 2015-09-20 NOTE — Telephone Encounter (Signed)
Son called and he is concerned with his father's blood pressure being up to 190/ today.  Has given him an extra dose of lasix Patient receives his care from Dr. Dwyane Dee. Suggested that he call Dr. Ronnie Derby office for advise

## 2015-09-20 NOTE — ED Notes (Signed)
Family brought pt for worsening cough, sob, edema over past month. They were here Sunday for same, discharged, saw PCP this week who increased lasix does with no relief. Pt reports good appetite and oral intake but continues to have low output compared to input. He denies pain, he is alert, mildly labored breathing at rest.

## 2015-09-20 NOTE — H&P (Signed)
[facility] History and Physical  Victor Green [id number] DOB: 10-Nov-1919 DOA: 09/20/2015  Referring physician: EDP PCP: [name], MD   Chief Complaint: Cough, SOB, edema   HPI: Victor Green is a 81 y.o. male who presents to the ED with family for worsening cough, SOB, edema over the past 1 month.  They were here in the ED Sunday for same, discharged, saw PCP who increased lasix dose without relief.  There also was some miss-communication as well as family was pushing patient to drink extra fluids at home as well (presumably this as a result of the dehydration / AKI admission which occurred around the beginning of October).  Despite 80mg  lasix PO he has worsening swelling, SOB, and DOE over the past couple of days.  Review of Systems: Systems reviewed.  As above, otherwise negative  Past Medical History  Diagnosis Date  . Hypertension   . CAD (coronary artery disease) complete w/u in 2009    extensive coronary calcification by EBCT, normal myoview, echo with EF 70% mild AI  . Hyperlipidemia   . Aortic insufficiency     echo July 2011 EF 60%  . 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 History of GI bleed 2003    due to pyloric-positive ulcer   . Diverticulosis     with remote history of diverticulitis  . Plantar fasciitis     chronic  . Allergic rhinitis     responding well to desensitation injections  . Glaucoma   . Osteoarthritis     both knees  . Asthma    Past Surgical History  Procedure Laterality Date  . Tonsillectomy    . Hemorrhoid surgery  1996  . Hernia repair  1996    right  . Lumbar laminectomy  2003  . Cataract extraction      left eye   Social History:  reports that he has never smoked. He does not have any smokeless tobacco history on file. He reports that he does not drink alcohol or use illicit drugs.  Allergies  Allergen Reactions  . Other Anaphylaxis    Horse serum  . Tetanus Toxoids Other (See Comments)     Family History  Problem Relation Age of Onset  . Thrombosis Father     mesenteric artery thrombosis after prostate surgery  . Colon cancer Mother      Prior to Admission medications   Medication Sig Start Date End Date Taking? Authorizing Provider  acetaminophen (TYLENOL) 325 MG tablet Take 325 mg by mouth every morning.    Yes Historical Provider, MD  atorvastatin (LIPITOR) 10 MG tablet Take 1 tablet by mouth  daily Patient taking differently: Take 0.5  tablet by mouth daily at bedtime 07/09/15  Yes [name], MD  azelastine (ASTELIN) 0.1 % nasal spray Place 1 spray into both nostrils 2 (two) times daily. Use in each nostril as directed Patient taking differently: Place 1 spray into both nostrils at bedtime. Use in each nostril as directed 08/20/14  Yes [name], MD  azelastine (ASTELIN) 0.1 % nasal spray Use 1 spray in each nostril two times daily as directed Patient taking differently: Use 1 spray in each nostril once daily 08/21/15  Yes [name], MD  B Complex-Biotin-FA (B-COMPLEX PO) Take 1 tablet by mouth daily.    Yes Historical Provider, MD  brimonidine (ALPHAGAN) 0.15 % ophthalmic solution Place 1 drop into the left eye 2 (two) times daily.  Yes Historical Provider, MD  dextromethorphan-guaiFENesin (MUCINEX DM) 30-600 MG per 12 hr tablet Take 1 tablet by mouth daily.    Yes Historical Provider, MD  diazepam (VALIUM) 2 MG tablet Take 1 tablet (2 mg total) by mouth daily. Patient taking differently: Take 1 mg by mouth at bedtime.  09/24/14  Yes [name], MD  diclofenac sodium (VOLTAREN) 1 % GEL Apply 3 times per day Patient taking differently: Apply 2 g topically daily as needed (for right knee). Apply 3 times per day 08/16/15  Yes [name], MD  dorzolamide (TRUSOPT) 2 % ophthalmic solution Place 1 drop into the left eye 2 (two) times daily.    Yes Historical Provider, MD  dutasteride (AVODART) 0.5 MG capsule Take 1 capsule by mouth  daily Patient taking differently:  Take 1 capsule by mouth  daily in evening 07/03/15  Yes [name], MD  EPIPEN 2-PAK 0.3 MG/0.3ML SOAJ injection Inject 0.37ml (0.3mg ) into  the muscle once as directed 12/26/14  Yes [name], MD  ferrous sulfate 325 (65 FE) MG tablet Take 325 mg by mouth daily with breakfast.   Yes Historical Provider, MD  fexofenadine (ALLEGRA) 180 MG tablet Take 90 mg by mouth every evening. 1/2 tablet once a day   Yes Historical Provider, MD  fluticasone (FLONASE) 50 MCG/ACT nasal spray Use 2 sprays in each  nostril twice daily 08/21/15  Yes [name], MD  furosemide (LASIX) 40 MG tablet Take 1 tablet daily, if there is swelling take 2 tablets daily until swelling goes down. 09/18/15  Yes [name], MD  glucosamine-chondroitin 500-400 MG tablet Take 1 tablet by mouth 2 (two) times daily.    Yes Historical Provider, MD  levothyroxine (SYNTHROID, LEVOTHROID) 50 MCG tablet Take 1 tablet by mouth  daily Patient taking differently: Take 0.5 tablet (95mcg) by mouth  daily 07/01/15  Yes [name], MD  metoprolol tartrate (LOPRESSOR) 25 MG tablet Take 0.5 tablets (12.5 mg total) by mouth 2 (two) times daily. 08/10/15  Yes Florencia Reasons, MD  Multiple Vitamin (MULTIVITAMIN) capsule Take 1 capsule by mouth daily.     Yes Historical Provider, MD  pantoprazole (PROTONIX) 40 MG tablet Take 1 tablet by mouth  daily 04/24/15  Yes [name], MD  potassium chloride (K-DUR) 10 MEQ tablet Take 2 tablets (20 mEq total) by mouth daily. 08/12/15  Yes [name], MD  Probiotic Product (ALIGN) 4 MG CAPS Take 4 mg by mouth every evening.   Yes Historical Provider, MD  traMADol (ULTRAM) 50 MG tablet Half to 1 tablet with food up to 3 times a day for pain Patient taking differently: Take 25-50 mg by mouth 3 (three) times daily as needed for moderate pain. Half to 1 tablet with food up to 3 times a day for pain 09/16/15  Yes [name], MD   Physical Exam: Filed Vitals:   09/20/15 2115  BP: 178/102  Pulse: 64  Temp:   Resp: 13    BP  178/102 mmHg  Pulse 64  Temp(Src) 98.4 F (36.9 C) (Oral)  Resp 13  SpO2 94%  General Appearance:    Alert, oriented, no distress, appears stated age  Head:    Normocephalic, atraumatic  Eyes:    PERRL, EOMI, sclera non-icteric        Nose:   Nares without drainage or epistaxis. Mucosa, turbinates normal  Throat:   Moist mucous membranes. Oropharynx without erythema or exudate.  Neck:   Supple. No carotid bruits.  No thyromegaly.  No lymphadenopathy.   Back:     No CVA tenderness, no spinal tenderness  Lungs:     Decreased on right.  Chest wall:    No tenderness to palpitation  Heart:    Regular rate and rhythm without murmurs, gallops, rubs  Abdomen:     Soft, non-tender, nondistended, normal bowel sounds, no organomegaly  Genitalia:    deferred  Rectal:    deferred  Extremities:   2+ pitting edema BLE  Pulses:   2+ and symmetric all extremities  Skin:   Skin color, texture, turgor normal, no rashes or lesions  Lymph nodes:   Cervical, supraclavicular, and axillary nodes normal  Neurologic:   CNII-XII intact. Normal strength, sensation and reflexes      throughout    Labs on Admission:  Basic Metabolic Panel:  Recent Labs Lab 09/15/15 1425 09/20/15 1901  NA 140 142  K 4.2 3.7  CL 106 102  CO2 27 28  GLUCOSE 93 131*  BUN 35* 38*  CREATININE 1.03 1.13  CALCIUM 8.5* 8.6*   Liver Function Tests:  Recent Labs Lab 09/15/15 1425  AST 32  ALT 20  ALKPHOS 64  BILITOT 0.7  PROT 5.8*  ALBUMIN 3.1*   No results for input(s): LIPASE, AMYLASE in the last 168 hours. No results for input(s): AMMONIA in the last 168 hours. CBC:  Recent Labs Lab 09/15/15 1425 09/20/15 1901  WBC 9.4 5.7  NEUTROABS 6.5  --   HGB 9.9* 9.9*  HCT 32.2* 31.7*  MCV 102.9* 102.3*  PLT 83* 78*   Cardiac Enzymes: No results for input(s): CKTOTAL, CKMB, CKMBINDEX, TROPONINI in the last 168 hours.  BNP (last 3 results) No results for input(s): PROBNP in the last 8760 hours. CBG: No  results for input(s): GLUCAP in the last 168 hours.  Radiological Exams on Admission: Dg Chest 2 View  09/20/2015  CLINICAL DATA:  Cough and sob x 1 month EXAM: CHEST  2 VIEW COMPARISON:  09/15/2015 FINDINGS: Lateral view degraded by patient arm position. Moderate right hemidiaphragm elevation. High-riding humeral heads, consistent with chronic rotator cuff insufficiency. Patient minimally rotated to the right on the frontal. Cardiomegaly with a tortuous thoracic aorta. No pleural effusion or pneumothorax. Atelectasis at the lung bases, worse on the right. No congestive failure. Transverse aortic atherosclerosis. IMPRESSION: No acute cardiopulmonary disease. Cardiomegaly without congestive failure. Right hemidiaphragm elevation with bibasilar atelectasis, worse at the right lung base. Electronically Signed   By: Abigail Miyamoto M.D.   On: 09/20/2015 20:24    EKG: Independently reviewed.  Assessment/Plan Principal Problem:   Acute on chronic diastolic CHF (congestive heart failure) (HCC) Active Problems:   Essential hypertension   Thrombocytopenia, unspecified ([place])   1. Acute on chronic diastolic CHF - 1. Heart failure pathway 2. Intake and output and daily weights, try to establish a good weight range to keep patient in at discharge time so that they can follow this at home. 3. Lasix 40mg  IV BID, first dose given in ED 4. Daily BMP, continuing home K pills 1. Monitor kidney function with diuresis, over diuresis caused patient to go into AKI and require admission last month, renal function appears to have returned to baseline since then though. 5. Continue metoprolol, no ACEi ordered due to renal insufficiency. 2. HTN - continue home meds and adding lasix 3. Thrombocytopenia - 1. Chronic and stable 2. Using SCDs only for DVT ppx    Code Status: Full Family Communication: Family is at bedside Disposition Plan:  Admit to obs   Time spent: 70 min  GARDNER, JARED M. [facility] Pager [phone]  If 7AM-7PM, please contact the day team taking care of the patient [email or web address] Password [facility] 09/20/2015, 9:26 PM

## 2015-09-20 NOTE — ED Provider Notes (Signed)
CSN: [id number]     Arrival date & time 09/20/15  1736 History   First MD Initiated Contact with Patient 09/20/15 [place]     Chief Complaint  Patient presents with  . Shortness of Breath      Patient is a 81 y.o. male presenting with shortness of breath. The history is provided by the patient, a relative and a caregiver. No language interpreter was used.  Shortness of Breath  Victor Green is a 81 y.o. male w/ hx/o CAD, HTN, chronic lower extremity edema who presents to the Emergency Department complaining of shortness breath and swelling. History is provided by the patient, his son, caretakers. Per report he has had chronic lower edema that has been waxing and waning. Over the last month he's had increasing edema of his legs as well as arms. The report one month progressive wheezing and shortness of breath. He has difficulty performing activities. He was placed on Lasix twice daily 5 days ago. He has not experienced any significant improvement in his symptoms since then and he does not have significant change in his urine output. Symptoms are moderate, waxing waning, worsening.  Past Medical History  Diagnosis Date  . Hypertension   . CAD (coronary artery disease) complete w/u in 2009    extensive coronary calcification by EBCT, normal myoview, echo with EF 70% mild AI  . Hyperlipidemia   . Aortic insufficiency     echo July 2011 EF 60%  . 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 History of GI bleed 2003    due to pyloric-positive ulcer   . Diverticulosis     with remote history of diverticulitis  . Plantar fasciitis     chronic  . Allergic rhinitis     responding well to desensitation injections  . Glaucoma   . Osteoarthritis     both knees  . Asthma    Past Surgical History  Procedure Laterality Date  . Tonsillectomy    . Hemorrhoid surgery  1996  . Hernia repair  1996    right  . Lumbar laminectomy  2003  . Cataract extraction      left eye   Family  History  Problem Relation Age of Onset  . Thrombosis Father     mesenteric artery thrombosis after prostate surgery  . Colon cancer Mother    Social History  Substance Use Topics  . Smoking status: Never Smoker   . Smokeless tobacco: None  . Alcohol Use: No    Review of Systems  Respiratory: Positive for shortness of breath.   All other systems reviewed and are negative.     Allergies  Other and Tetanus toxoids  Home Medications   Prior to Admission medications   Medication Sig Start Date End Date Taking? Authorizing Provider  acetaminophen (TYLENOL) 325 MG tablet Take 325 mg by mouth every morning.     Historical Provider, MD  atorvastatin (LIPITOR) 10 MG tablet Take 1 tablet by mouth  daily Patient taking differently: Take 0.5  tablet by mouth daily at bedtime 07/09/15   [name], MD  azelastine (ASTELIN) 0.1 % nasal spray Place 1 spray into both nostrils 2 (two) times daily. Use in each nostril as directed Patient taking differently: Place 1 spray into both nostrils at bedtime. Use in each nostril as directed 08/20/14   [name], MD  azelastine (ASTELIN) 0.1 % nasal spray Use 1 spray in each nostril two times daily as  directed Patient taking differently: Use 1 spray in each nostril once daily 08/21/15   [name], MD  B Complex-Biotin-FA (B-COMPLEX PO) Take 1 tablet by mouth daily.     Historical Provider, MD  brimonidine (ALPHAGAN) 0.15 % ophthalmic solution Place 1 drop into the left eye 2 (two) times daily.     Historical Provider, MD  dextromethorphan-guaiFENesin (MUCINEX DM) 30-600 MG per 12 hr tablet Take 1 tablet by mouth daily.     Historical Provider, MD  diazepam (VALIUM) 2 MG tablet Take 1 tablet (2 mg total) by mouth daily. Patient taking differently: Take 1 mg by mouth at bedtime.  09/24/14   [name], MD  diclofenac sodium (VOLTAREN) 1 % GEL Apply 3 times per day Patient taking differently: Apply 2 g topically daily as needed (for right knee). Apply 3  times per day 08/16/15   [name], MD  dorzolamide (TRUSOPT) 2 % ophthalmic solution Place 1 drop into the left eye 2 (two) times daily.     Historical Provider, MD  dutasteride (AVODART) 0.5 MG capsule Take 1 capsule by mouth  daily Patient taking differently: Take 1 capsule by mouth  daily in evening 07/03/15   [name], MD  EPIPEN 2-PAK 0.3 MG/0.3ML SOAJ injection Inject 0.1ml (0.3mg ) into  the muscle once as directed 12/26/14   [name], MD  ferrous sulfate 325 (65 FE) MG tablet Take 325 mg by mouth daily with breakfast.    Historical Provider, MD  fexofenadine (ALLEGRA) 180 MG tablet Take 90 mg by mouth every evening. 1/2 tablet once a day    Historical Provider, MD  fluticasone (FLONASE) 50 MCG/ACT nasal spray Use 2 sprays in each  nostril twice daily 08/21/15   [name], MD  furosemide (LASIX) 40 MG tablet Take 1 tablet daily, if there is swelling take 2 tablets daily until swelling goes down. 09/18/15   [name], MD  glucosamine-chondroitin 500-400 MG tablet Take 1 tablet by mouth 2 (two) times daily.     Historical Provider, MD  levothyroxine (SYNTHROID, LEVOTHROID) 50 MCG tablet Take 1 tablet by mouth  daily Patient taking differently: Take 0.5 tablet by mouth  daily 07/01/15   [name], MD  metoprolol tartrate (LOPRESSOR) 25 MG tablet Take 0.5 tablets (12.5 mg total) by mouth 2 (two) times daily. 08/10/15   Florencia Reasons, MD  Multiple Vitamin (MULTIVITAMIN) capsule Take 1 capsule by mouth daily.      Historical Provider, MD  pantoprazole (PROTONIX) 40 MG tablet Take 1 tablet by mouth  daily 04/24/15   [name], MD  potassium chloride (K-DUR) 10 MEQ tablet Take 2 tablets (20 mEq total) by mouth daily. 08/12/15   [name], MD  Probiotic Product (ALIGN) 4 MG CAPS Take 4 mg by mouth every evening.    Historical Provider, MD  traMADol (ULTRAM) 50 MG tablet Half to 1 tablet with food up to 3 times a day for pain 09/16/15   [name], MD   BP 178/87 mmHg  Pulse 65  Temp(Src) 98.4 F  (36.9 C) (Oral)  Resp 14  SpO2 95% Physical Exam  Constitutional: He is oriented to person, place, and time. He appears well-developed and well-nourished.  Chronically ill-appearing  HENT:  Head: Normocephalic and atraumatic.  Cardiovascular: Normal rate and regular rhythm.   No murmur heard. Pulmonary/Chest: Effort normal. No respiratory distress.  Occasional crackles in the left lung base. Decreased air movement in right lung base.  Abdominal: Soft. There is no tenderness. There  is no rebound and no guarding.  Musculoskeletal: He exhibits no tenderness.  2+ pitting edema in BLE.  Trace pitting edema in BUE  Neurological: He is alert and oriented to person, place, and time.  Generalized weakness  Skin: Skin is warm and dry.  Psychiatric: He has a normal mood and affect. His behavior is normal.  Flat affect  Nursing note and vitals reviewed.   ED Course  Procedures (including critical care time) Labs Review Labs Reviewed  BASIC METABOLIC PANEL - Abnormal; Notable for the following:    Glucose, Bld 131 (*)    BUN 38 (*)    Calcium 8.6 (*)    GFR calc non Af Amer 54 (*)    All other components within normal limits  CBC - Abnormal; Notable for the following:    RBC 3.10 (*)    Hemoglobin 9.9 (*)    HCT 31.7 (*)    MCV 102.3 (*)    RDW 19.3 (*)    Platelets 78 (*)    All other components within normal limits  BRAIN NATRIURETIC PEPTIDE - Abnormal; Notable for the following:    B Natriuretic Peptide 443.0 (*)    All other components within normal limits  BASIC METABOLIC PANEL  I-STAT TROPOININ, ED    Imaging Review Dg Chest 2 View  09/20/2015  CLINICAL DATA:  Cough and sob x 1 month EXAM: CHEST  2 VIEW COMPARISON:  09/15/2015 FINDINGS: Lateral view degraded by patient arm position. Moderate right hemidiaphragm elevation. High-riding humeral heads, consistent with chronic rotator cuff insufficiency. Patient minimally rotated to the right on the frontal. Cardiomegaly with  a tortuous thoracic aorta. No pleural effusion or pneumothorax. Atelectasis at the lung bases, worse on the right. No congestive failure. Transverse aortic atherosclerosis. IMPRESSION: No acute cardiopulmonary disease. Cardiomegaly without congestive failure. Right hemidiaphragm elevation with bibasilar atelectasis, worse at the right lung base. Electronically Signed   By: Abigail Miyamoto M.D.   On: 09/20/2015 20:24   I have personally reviewed and evaluated these images and lab results as part of my medical decision-making.   EKG Interpretation   Date/Time:  Friday September 20 2015 18:20:47 EST Ventricular Rate:  71 PR Interval:  244 QRS Duration: 122 QT Interval:  418 QTC Calculation: [id number] R Axis:   -50 Text Interpretation:  Sinus rhythm with 1st degree A-V block with  Premature atrial complexes Left axis deviation Right bundle branch block  Inferior infarct , age undetermined Anterolateral infarct , age  undetermined Abnormal ECG Confirmed by Hazle Coca [phone]) on 09/20/2015  6:53:41 PM      MDM   Final diagnoses:  Acute on chronic diastolic congestive heart failure [facility])    Patient here with progressive edema and shortness of breath. Exam with anasarca.  BNP elevated compared to priors. BMP unchanged. Discussed with patient and family findings of congestive heart failure, recommends diuresis and admission. Discussed with the hospitalist regarding admission for further treatment.  Quintella Reichert, MD 09/21/15 [phone]

## 2015-09-20 NOTE — ED Notes (Signed)
Attempted report, no bed in room on the floor

## 2015-09-21 ENCOUNTER — Encounter (HOSPITAL_COMMUNITY): Payer: Self-pay

## 2015-09-21 ENCOUNTER — Observation Stay (HOSPITAL_COMMUNITY): Payer: 59

## 2015-09-21 DIAGNOSIS — D696 Thrombocytopenia, unspecified: Secondary | ICD-10-CM

## 2015-09-21 DIAGNOSIS — I5033 Acute on chronic diastolic (congestive) heart failure: Secondary | ICD-10-CM

## 2015-09-21 DIAGNOSIS — N400 Enlarged prostate without lower urinary tract symptoms: Secondary | ICD-10-CM

## 2015-09-21 DIAGNOSIS — I371 Nonrheumatic pulmonary valve insufficiency: Secondary | ICD-10-CM

## 2015-09-21 DIAGNOSIS — I509 Heart failure, unspecified: Secondary | ICD-10-CM

## 2015-09-21 DIAGNOSIS — I50810 Right heart failure, unspecified: Secondary | ICD-10-CM

## 2015-09-21 DIAGNOSIS — I10 Essential (primary) hypertension: Secondary | ICD-10-CM | POA: Diagnosis not present

## 2015-09-21 DIAGNOSIS — R601 Generalized edema: Secondary | ICD-10-CM | POA: Diagnosis not present

## 2015-09-21 LAB — BASIC METABOLIC PANEL
Anion gap: 9 (ref 5–15)
BUN: 38 mg/dL — AB (ref 6–20)
CHLORIDE: 100 mmol/L — AB (ref 101–111)
CO2: 32 mmol/L (ref 22–32)
CREATININE: 1.16 mg/dL (ref 0.61–1.24)
Calcium: 8.3 mg/dL — ABNORMAL LOW (ref 8.9–10.3)
GFR, EST NON AFRICAN AMERICAN: 52 mL/min — AB (ref >60)
Glucose, Bld: 123 mg/dL — ABNORMAL HIGH (ref 65–99)
POTASSIUM: 3.2 mmol/L — AB (ref 3.5–5.1)
SODIUM: 141 mmol/L (ref 135–145)

## 2015-09-21 MED ORDER — LISINOPRIL 10 MG PO TABS
10.0000 mg | ORAL_TABLET | Freq: Every day | ORAL | Status: DC
  Administered 2015-09-21 – 2015-09-22 (×2): 10 mg via ORAL
  Filled 2015-09-21 (×2): qty 1

## 2015-09-21 MED ORDER — HYDRALAZINE HCL 10 MG PO TABS
10.0000 mg | ORAL_TABLET | Freq: Three times a day (TID) | ORAL | Status: DC
  Administered 2015-09-21 – 2015-09-22 (×4): 10 mg via ORAL
  Filled 2015-09-21 (×4): qty 1

## 2015-09-21 MED ORDER — POTASSIUM CHLORIDE CRYS ER 20 MEQ PO TBCR
40.0000 meq | EXTENDED_RELEASE_TABLET | Freq: Once | ORAL | Status: AC
  Administered 2015-09-21: 40 meq via ORAL
  Filled 2015-09-21: qty 2

## 2015-09-21 MED ORDER — LEVOTHYROXINE SODIUM 25 MCG PO TABS
25.0000 ug | ORAL_TABLET | Freq: Every day | ORAL | Status: DC
  Administered 2015-09-21 – 2015-09-22 (×2): 25 ug via ORAL
  Filled 2015-09-21 (×2): qty 1

## 2015-09-21 MED ORDER — FUROSEMIDE 10 MG/ML IJ SOLN
20.0000 mg | Freq: Once | INTRAMUSCULAR | Status: AC
  Administered 2015-09-21: 20 mg via INTRAVENOUS
  Filled 2015-09-21: qty 2

## 2015-09-21 NOTE — Progress Notes (Signed)
[facility] Progress Note   Victor Green  [id number]  DOB: 11/02/1920  DOA: 09/20/2015 PCP: [name], MD  Brief narrative: Victor Green is a 81 y.o. male who is bedbound with private sitters, h/o CAD, HTN, HDL, recent admission for acute renal failure/ dehydration presents to the hospital for swelling in his arms and legs   Subjective: He feels that his arm swelling is improving. Leg swelling is still quite severe. No dyspnea or cough.   Assessment/Plan: Principal Problem:   Right heart failure/  Pulmonary regurgitation/ anasarca - difficult situation- has mod pulm regurgitation resulting in right heart failure and anasarca - recently became over diuresed and orthostatic and required admission for IVF - have spoken with patient and his son in detail - will need to find the right fluid balance for him without causing orthostasis - have advised ACE wraps for his legs as he spends most of the day sitting in his wheelchair and elevation of legs when in bed - in negative balance by almost 2 L - will give him another small dose of IV lasix today and re-assess in the AM - his son has bought a scale which is compatible with his wheelchair and will start checking daily weights   Active Problems:   Essential hypertension - BP quite elevated which is likely adding to right heart failure and anasarca - cont low dose Metoprolol (HR in 60s) - decrease afterload with low dose Hydralazine and Lisinopril  Hypokalemia - due to lasix- replace    BPH (benign prostatic hyperplasia) - cont Avodart    Thrombocytopenia, unspecified - chronic  Hypothyroid - cont synthroid       Code Status:     Code Status Orders        Start     Ordered   09/20/15 2118  Full code   Continuous     09/20/15 2121    Advance Directive Documentation        Most Recent Value   Type of Advance Directive  Healthcare Power of [place], Living will   Pre-existing out of facility DNR  order (yellow form or pink MOST form)     "MOST" Form in Place?       Family Communication:son Disposition Plan: home romorrow DVT prophylaxis: SCDs Consultants: Procedures:  Antibiotics: Anti-infectives    None      Objective: Filed Weights   09/20/15 2244 09/21/15 0639  Weight: 92.488 kg (203 lb 14.4 oz) 95.301 kg (210 lb 1.6 oz)    Intake/Output Summary (Last 24 hours) at 09/21/15 1347 Last data filed at 09/21/15 1245  Gross per 24 hour  Intake    723 ml  Output   2599 ml  Net  -1876 ml     Vitals Filed Vitals:   09/20/15 2244 09/21/15 0217 09/21/15 0639 09/21/15 1257  BP: 164/79 152/50 176/72 129/67  Pulse: 64 62 61 63  Temp: 98.4 F (36.9 C) 98 F (36.7 C) 97.4 F (36.3 C) 98.7 F (37.1 C)  TempSrc: Oral Oral Oral Oral  Resp: 20 18 20 20   Height: 5\' 11"  (1.803 m)     Weight: 92.488 kg (203 lb 14.4 oz)  95.301 kg (210 lb 1.6 oz)   SpO2: 90% 92% 90% 91%    Exam:  General:  Pt is alert, not in acute distress  HEENT: No icterus, No thrush, oral mucosa moist  Cardiovascular: regular rate and rhythm, S1/S2 No murmur  Respiratory: clear to auscultation bilaterally   Abdomen:  Soft, +Bowel sounds, non tender, non distended, no guarding  MSK: No cyanosis or clubbing- mild edema of arms, 2 + pitting edema of feet/ ankles   Data Reviewed: Basic Metabolic Panel:  Recent Labs Lab 09/15/15 1425 09/20/15 1901 09/21/15 0316  NA 140 142 141  K 4.2 3.7 3.2*  CL 106 102 100*  CO2 27 28 32  GLUCOSE 93 131* 123*  BUN 35* 38* 38*  CREATININE 1.03 1.13 1.16  CALCIUM 8.5* 8.6* 8.3*   Liver Function Tests:  Recent Labs Lab 09/15/15 1425  AST 32  ALT 20  ALKPHOS 64  BILITOT 0.7  PROT 5.8*  ALBUMIN 3.1*   No results for input(s): LIPASE, AMYLASE in the last 168 hours. No results for input(s): AMMONIA in the last 168 hours. CBC:  Recent Labs Lab 09/15/15 1425 09/20/15 1901  WBC 9.4 5.7  NEUTROABS 6.5  --   HGB 9.9* 9.9*  HCT 32.2* 31.7*   MCV 102.9* 102.3*  PLT 83* 78*   Cardiac Enzymes: No results for input(s): CKTOTAL, CKMB, CKMBINDEX, TROPONINI in the last 168 hours. BNP (last 3 results)  Recent Labs  09/15/15 1426 09/20/15 1938  BNP 221.4* 443.0*    ProBNP (last 3 results) No results for input(s): PROBNP in the last 8760 hours.  CBG: No results for input(s): GLUCAP in the last 168 hours.  Recent Results (from the past 240 hour(s))  Urine culture     Status: None   Collection Time: 09/15/15  2:08 PM  Result Value Ref Range Status   Specimen Description URINE, CLEAN CATCH  Final   Special Requests NONE  Final   Culture MULTIPLE SPECIES PRESENT, SUGGEST RECOLLECTION  Final   Report Status 09/16/2015 FINAL  Final     Studies: Dg Chest 2 View  09/20/2015  CLINICAL DATA:  Cough and sob x 1 month EXAM: CHEST  2 VIEW COMPARISON:  09/15/2015 FINDINGS: Lateral view degraded by patient arm position. Moderate right hemidiaphragm elevation. High-riding humeral heads, consistent with chronic rotator cuff insufficiency. Patient minimally rotated to the right on the frontal. Cardiomegaly with a tortuous thoracic aorta. No pleural effusion or pneumothorax. Atelectasis at the lung bases, worse on the right. No congestive failure. Transverse aortic atherosclerosis. IMPRESSION: No acute cardiopulmonary disease. Cardiomegaly without congestive failure. Right hemidiaphragm elevation with bibasilar atelectasis, worse at the right lung base. Electronically Signed   By: Abigail Miyamoto M.D.   On: 09/20/2015 20:24    Scheduled Meds:  Scheduled Meds: . acetaminophen  325 mg Oral q morning - 10a  . acidophilus  1 capsule Oral QPM  . atorvastatin  5 mg Oral q1800  . azelastine  1 spray Each Nare QHS  . brimonidine  1 drop Left Eye BID  . diazepam  1 mg Oral QHS  . dorzolamide  1 drop Left Eye BID  . dutasteride  0.5 mg Oral QHS  . ferrous sulfate  325 mg Oral Q breakfast  . fluticasone  2 spray Each Nare Daily  . furosemide  40  mg Intravenous BID  . hydrALAZINE  10 mg Oral 3 times per day  . levothyroxine  25 mcg Oral QAC breakfast  . lisinopril  10 mg Oral Daily  . loratadine  10 mg Oral Daily  . metoprolol tartrate  12.5 mg Oral BID  . multivitamin with minerals  1 tablet Oral Daily  . pantoprazole  40 mg Oral Daily  . potassium chloride  20 mEq Oral Daily  . sodium chloride  3 mL Intravenous Q12H   Continuous Infusions:   Time spent on care of this patient: 35 min   [place], MD 09/21/2015, 1:47 PM    [facility] Office  [phone] Pager - Text Page per [email or web address] If 7PM-7AM, please contact night-coverage [email or web address]

## 2015-09-22 ENCOUNTER — Observation Stay (HOSPITAL_BASED_OUTPATIENT_CLINIC_OR_DEPARTMENT_OTHER): Payer: 59

## 2015-09-22 DIAGNOSIS — N400 Enlarged prostate without lower urinary tract symptoms: Secondary | ICD-10-CM | POA: Diagnosis not present

## 2015-09-22 DIAGNOSIS — I10 Essential (primary) hypertension: Secondary | ICD-10-CM | POA: Diagnosis not present

## 2015-09-22 DIAGNOSIS — R600 Localized edema: Secondary | ICD-10-CM | POA: Diagnosis not present

## 2015-09-22 DIAGNOSIS — I509 Heart failure, unspecified: Secondary | ICD-10-CM | POA: Diagnosis not present

## 2015-09-22 DIAGNOSIS — R601 Generalized edema: Secondary | ICD-10-CM

## 2015-09-22 DIAGNOSIS — D696 Thrombocytopenia, unspecified: Secondary | ICD-10-CM | POA: Diagnosis not present

## 2015-09-22 DIAGNOSIS — I371 Nonrheumatic pulmonary valve insufficiency: Secondary | ICD-10-CM | POA: Diagnosis not present

## 2015-09-22 LAB — BASIC METABOLIC PANEL
ANION GAP: 8 (ref 5–15)
BUN: 33 mg/dL — ABNORMAL HIGH (ref 6–20)
CALCIUM: 8.5 mg/dL — AB (ref 8.9–10.3)
CHLORIDE: 101 mmol/L (ref 101–111)
CO2: 32 mmol/L (ref 22–32)
Creatinine, Ser: 1.15 mg/dL (ref 0.61–1.24)
GFR calc non Af Amer: 53 mL/min — ABNORMAL LOW (ref >60)
GLUCOSE: 98 mg/dL (ref 65–99)
POTASSIUM: 3.6 mmol/L (ref 3.5–5.1)
Sodium: 141 mmol/L (ref 135–145)

## 2015-09-22 MED ORDER — DIAZEPAM 2 MG PO TABS: 1.0000 mg | ORAL_TABLET | Freq: Every day | ORAL | Status: DC

## 2015-09-22 MED ORDER — HYDRALAZINE HCL 10 MG PO TABS: 10.0000 mg | ORAL_TABLET | Freq: Three times a day (TID) | ORAL | Status: DC

## 2015-09-22 MED ORDER — DM-GUAIFENESIN ER 30-600 MG PO TB12: 1.0000 | ORAL_TABLET | Freq: Two times a day (BID) | ORAL | Status: DC | PRN

## 2015-09-22 MED ORDER — CARVEDILOL 6.25 MG PO TABS: 6.2500 mg | ORAL_TABLET | Freq: Two times a day (BID) | ORAL | Status: AC

## 2015-09-22 NOTE — Consult Note (Signed)
CONSULT NOTE  Date: 09/22/2015               Patient Name:  Victor Green MRN: [id number]  DOB: 23-Apr-1920 Age / Sex: 81 y.o., male        PCP: [facility] Primary Cardiologist: [place]            Referring Physician: Wynelle Cleveland              Reason for Consult: CHF           History of Present Illness: Patient is a 81 y.o. male with a PMHx of CAD, HTN,   who was admitted to [facility] on 09/20/2015 for evaluation of worsening shortness of breath, cough, and leg edema.   The patient has had markedly elevated blood pressure and leg edema  for the past several days. He was instructed to go to the emergency room for further evaluation.  Medications: Outpatient medications: Prescriptions prior to admission  Medication Sig Dispense Refill Last Dose  . acetaminophen (TYLENOL) 325 MG tablet Take 325 mg by mouth every morning.    09/20/2015 at Unknown time  . atorvastatin (LIPITOR) 10 MG tablet Take 1 tablet by mouth  daily (Patient taking differently: Take 0.5  tablet by mouth daily at bedtime) 90 tablet 1 09/19/2015 at Unknown time  . azelastine (ASTELIN) 0.1 % nasal spray Place 1 spray into both nostrils 2 (two) times daily. Use in each nostril as directed (Patient taking differently: Place 1 spray into both nostrils at bedtime. Use in each nostril as directed) 30 mL 3 09/19/2015 at Unknown time  . azelastine (ASTELIN) 0.1 % nasal spray Use 1 spray in each nostril two times daily as directed (Patient taking differently: Use 1 spray in each nostril once daily) 60 mL 1 09/20/2015 at Unknown time  . B Complex-Biotin-FA (B-COMPLEX PO) Take 1 tablet by mouth daily.    09/20/2015 at Unknown time  . brimonidine (ALPHAGAN) 0.15 % ophthalmic solution Place 1 drop into the left eye 2 (two) times daily.    09/20/2015 at Unknown time  . dextromethorphan-guaiFENesin (MUCINEX DM) 30-600 MG per 12 hr tablet Take 1 tablet by mouth daily.    09/20/2015 at Unknown time  . diazepam (VALIUM) 2 MG tablet  Take 1 tablet (2 mg total) by mouth daily. (Patient taking differently: Take 1 mg by mouth at bedtime. ) 90 tablet 1 09/19/2015 at Unknown time  . diclofenac sodium (VOLTAREN) 1 % GEL Apply 3 times per day (Patient taking differently: Apply 2 g topically daily as needed (for right knee). Apply 3 times per day) 60 g 1 Past Month at Unknown time  . dorzolamide (TRUSOPT) 2 % ophthalmic solution Place 1 drop into the left eye 2 (two) times daily.    09/20/2015 at Unknown time  . dutasteride (AVODART) 0.5 MG capsule Take 1 capsule by mouth  daily (Patient taking differently: Take 1 capsule by mouth  daily in evening) 90 capsule 1 09/19/2015 at Unknown time  . EPIPEN 2-PAK 0.3 MG/0.3ML SOAJ injection Inject 0.20ml (0.3mg ) into  the muscle once as directed 2 Device 0 not used  . ferrous sulfate 325 (65 FE) MG tablet Take 325 mg by mouth daily with breakfast.   09/20/2015 at Unknown time  . fexofenadine (ALLEGRA) 180 MG tablet Take 90 mg by mouth every evening. 1/2 tablet once a day   09/20/2015 at Unknown time  . fluticasone (FLONASE) 50 MCG/ACT nasal spray Use 2 sprays in each  nostril twice daily 64 g 1 09/20/2015 at Unknown time  . furosemide (LASIX) 40 MG tablet Take 1 tablet daily, if there is swelling take 2 tablets daily until swelling goes down. 60 tablet 1 09/20/2015 at Unknown time  . glucosamine-chondroitin 500-400 MG tablet Take 1 tablet by mouth 2 (two) times daily.    09/20/2015 at Unknown time  . levothyroxine (SYNTHROID, LEVOTHROID) 50 MCG tablet Take 1 tablet by mouth  daily (Patient taking differently: Take 0.5 tablet (61mcg) by mouth  daily) 90 tablet 1 09/20/2015 at Unknown time  . metoprolol tartrate (LOPRESSOR) 25 MG tablet Take 0.5 tablets (12.5 mg total) by mouth 2 (two) times daily. 60 tablet 0 09/20/2015 at 0900  . Multiple Vitamin (MULTIVITAMIN) capsule Take 1 capsule by mouth daily.     09/20/2015 at Unknown time  . pantoprazole (PROTONIX) 40 MG tablet Take 1 tablet by mouth  daily  90 tablet 1 09/20/2015 at Unknown time  . potassium chloride (K-DUR) 10 MEQ tablet Take 2 tablets (20 mEq total) by mouth daily. 60 tablet 0 09/20/2015 at Unknown time  . Probiotic Product (ALIGN) 4 MG CAPS Take 4 mg by mouth every evening.   09/19/2015 at Unknown time  . traMADol (ULTRAM) 50 MG tablet Half to 1 tablet with food up to 3 times a day for pain (Patient taking differently: Take 25-50 mg by mouth 3 (three) times daily as needed for moderate pain. Half to 1 tablet with food up to 3 times a day for pain) 30 tablet 0 09/20/2015 at Unknown time    Current medications: Current Facility-Administered Medications  Medication Dose Route Frequency Provider Last Rate Last Dose  . 0.9 %  sodium chloride infusion  250 mL Intravenous PRN Etta Quill, DO      . acetaminophen (TYLENOL) tablet 325 mg  325 mg Oral q morning - 10a Etta Quill, DO   325 mg at 09/22/15 0940  . acetaminophen (TYLENOL) tablet 650 mg  650 mg Oral Q4H PRN Etta Quill, DO      . acidophilus (RISAQUAD) capsule 1 capsule  1 capsule Oral QPM Etta Quill, DO   1 capsule at 09/21/15 1743  . atorvastatin (LIPITOR) tablet 5 mg  5 mg Oral q1800 Etta Quill, DO   5 mg at 09/21/15 1743  . azelastine (ASTELIN) 0.1 % nasal spray 1 spray  1 spray Each Nare QHS Etta Quill, DO   1 spray at 09/21/15 2152  . brimonidine (ALPHAGAN) 0.2 % ophthalmic solution 1 drop  1 drop Left Eye BID Etta Quill, DO   1 drop at 09/22/15 [id number]  . diazepam (VALIUM) tablet 1 mg  1 mg Oral QHS Etta Quill, DO   1 mg at 09/21/15 2150  . dorzolamide (TRUSOPT) 2 % ophthalmic solution 1 drop  1 drop Left Eye BID Etta Quill, DO   1 drop at 09/22/15 [id number]  . dutasteride (AVODART) capsule 0.5 mg  0.5 mg Oral QHS Etta Quill, DO   0.5 mg at 09/21/15 2159  . ferrous sulfate tablet 325 mg  325 mg Oral Q breakfast Etta Quill, DO   325 mg at 09/22/15 0940  . fluticasone (FLONASE) 50 MCG/ACT nasal spray 2 spray  2 spray Each Nare  Daily Etta Quill, DO   2 spray at 09/22/15 0941  . hydrALAZINE (APRESOLINE) tablet 10 mg  10 mg Oral 3 times per day Debbe Odea, MD   10 mg  at 09/22/15 0525  . levothyroxine (SYNTHROID, LEVOTHROID) tablet 25 mcg  25 mcg Oral QAC breakfast Etta Quill, DO   25 mcg at 09/22/15 0530  . lisinopril (PRINIVIL,ZESTRIL) tablet 10 mg  10 mg Oral Daily Debbe Odea, MD   10 mg at 09/22/15 0940  . loratadine (CLARITIN) tablet 10 mg  10 mg Oral Daily Etta Quill, DO   10 mg at 09/22/15 0940  . metoprolol tartrate (LOPRESSOR) tablet 12.5 mg  12.5 mg Oral BID Etta Quill, DO   12.5 mg at 09/22/15 0940  . multivitamin with minerals tablet 1 tablet  1 tablet Oral Daily Etta Quill, DO   1 tablet at 09/22/15 0940  . ondansetron (ZOFRAN) injection 4 mg  4 mg Intravenous Q6H PRN Etta Quill, DO      . pantoprazole (PROTONIX) EC tablet 40 mg  40 mg Oral Daily Etta Quill, DO   40 mg at 09/22/15 0940  . sodium chloride 0.9 % injection 3 mL  3 mL Intravenous Q12H Etta Quill, DO   3 mL at 09/21/15 2150  . sodium chloride 0.9 % injection 3 mL  3 mL Intravenous PRN Etta Quill, DO      . traMADol Veatrice Bourbon) tablet 25-50 mg  25-50 mg Oral TID PRN Etta Quill, DO         Allergies  Allergen Reactions  . Other Anaphylaxis    Horse serum  . Tetanus Toxoids Other (See Comments)     Past Medical History  Diagnosis Date  . Hypertension   . CAD (coronary artery disease) complete w/u in 2009    extensive coronary calcification by EBCT, normal myoview, echo with EF 70% mild AI  . Hyperlipidemia   . Aortic insufficiency     echo July 2011 EF 60%  . 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 History of GI bleed 2003    due to pyloric-positive ulcer   . Diverticulosis     with remote history of diverticulitis  . Plantar fasciitis     chronic  . Allergic rhinitis     responding well to desensitation injections  . Glaucoma   . Osteoarthritis     both knees  .  Asthma     Past Surgical History  Procedure Laterality Date  . Tonsillectomy    . Hemorrhoid surgery  1996  . Hernia repair  1996    right  . Lumbar laminectomy  2003  . Cataract extraction      left eye    Family History  Problem Relation Age of Onset  . Thrombosis Father     mesenteric artery thrombosis after prostate surgery  . Colon cancer Mother     Social History:  reports that he has never smoked. He does not have any smokeless tobacco history on file. He reports that he does not drink alcohol or use illicit drugs.   Review of Systems: Constitutional:  denies fever, chills, diaphoresis, appetite change and fatigue.  HEENT: denies photophobia, eye pain, redness, hearing loss, ear pain, congestion, sore throat, rhinorrhea, sneezing, neck pain, neck stiffness and tinnitus.  Respiratory: denies SOB, DOE, cough, chest tightness, and wheezing.  Cardiovascular: denies chest pain, palpitations .  He has had some significant  leg swelling.  Gastrointestinal: denies nausea, vomiting, abdominal pain, diarrhea, constipation, blood in stool.  Genitourinary: denies dysuria, urgency, frequency, hematuria, flank pain and difficulty urinating.  Musculoskeletal: denies  myalgias, back pain, joint swelling, arthralgias and gait problem.   Skin: denies pallor, rash and wound.  Neurological: denies dizziness, seizures, syncope, weakness, light-headedness, numbness and headaches.   Hematological: denies adenopathy, easy bruising, personal or family bleeding history.  Psychiatric/ Behavioral: denies suicidal ideation, mood changes, confusion, nervousness, sleep disturbance and agitation.    Physical Exam: BP 184/77 mmHg  Pulse 61  Temp(Src) 97.7 F (36.5 C) (Oral)  Resp 18  Ht 5\' 11"  (1.803 m)  Wt 206 lb (93.441 kg)  BMI 28.74 kg/m2  SpO2 94%  Wt Readings from Last 3 Encounters:  09/22/15 206 lb (93.441 kg)  09/02/15 207 lb (93.895 kg)  08/09/15 171 lb 4.8 oz (77.7 kg)     General: Vital signs reviewed and noted. Chronically ill-appearing, elderly gentleman in no acute distress   Head: Normocephalic, atraumatic, sclera anicteric,   Neck: Supple. Negative for carotid bruits. No JVD   Lungs:  Clear bilaterally, no  wheezes, rales, or rhonchi. Breathing is normal   Heart: RRR with S1 S2. Soft systolic  Murmur,   Trace - mild ankle edema   Abdomen/ GI :  Soft, non-tender, non-distended with normoactive bowel sounds. No hepatomegaly. No rebound/guarding. No obvious abdominal masses   MSK: Strength and the appear normal for age.   Extremities: No clubbing or cyanosis. No edema.  Distal pedal pulses are 2+ and equal   Neurologic:  CN are grossly intact,  No obvious motor or sensory defect.  Alert and oriented X 3. Moves all extremities spontaneously.  Psych: Responds to questions appropriately with a normal affect.     Lab results: Basic Metabolic Panel:  Recent Labs Lab 09/20/15 1901 09/21/15 0316 09/22/15 0234  NA 142 141 141  K 3.7 3.2* 3.6  CL 102 100* 101  CO2 28 32 32  GLUCOSE 131* 123* 98  BUN 38* 38* 33*  CREATININE 1.13 1.16 1.15  CALCIUM 8.6* 8.3* 8.5*    Liver Function Tests:  Recent Labs Lab 09/15/15 1425  AST 32  ALT 20  ALKPHOS 64  BILITOT 0.7  PROT 5.8*  ALBUMIN 3.1*   No results for input(s): LIPASE, AMYLASE in the last 168 hours. No results for input(s): AMMONIA in the last 168 hours.  CBC:  Recent Labs Lab 09/15/15 1425 09/20/15 1901  WBC 9.4 5.7  NEUTROABS 6.5  --   HGB 9.9* 9.9*  HCT 32.2* 31.7*  MCV 102.9* 102.3*  PLT 83* 78*    Cardiac Enzymes: No results for input(s): CKTOTAL, CKMB, CKMBINDEX, TROPONINI in the last 168 hours.  BNP: Invalid input(s): POCBNP  CBG: No results for input(s): GLUCAP in the last 168 hours.  Coagulation Studies: No results for input(s): LABPROT, INR in the last 72 hours.   Other results: Personal review of EKG shows :   NSR , RBBB  Imaging: Dg Chest 2  View  09/20/2015  CLINICAL DATA:  Cough and sob x 1 month EXAM: CHEST  2 VIEW COMPARISON:  09/15/2015 FINDINGS: Lateral view degraded by patient arm position. Moderate right hemidiaphragm elevation. High-riding humeral heads, consistent with chronic rotator cuff insufficiency. Patient minimally rotated to the right on the frontal. Cardiomegaly with a tortuous thoracic aorta. No pleural effusion or pneumothorax. Atelectasis at the lung bases, worse on the right. No congestive failure. Transverse aortic atherosclerosis. IMPRESSION: No acute cardiopulmonary disease. Cardiomegaly without congestive failure. Right hemidiaphragm elevation with bibasilar atelectasis, worse at the right lung base. Electronically Signed   By: Abigail Miyamoto M.D.   On:  09/20/2015 20:24       Assessment & Plan:   1.  Edema:  No specific cardiac etiology found .  His LV function is normal .   He has trace - mild valvular abnormalities but these would not cause his leg edema / anasarca His echo is stable   2. Essential HTN:  Agree with adding meds.  Would change metoprolol to Coreg which would be better at BP control .  Agree with hydralazine . Would avoid amlodipine since it can worsen edema       Thayer Headings, Brooke Bonito., MD, [facility] 09/22/2015, 1:48 PM Office - [phone] Pager [phone]

## 2015-09-22 NOTE — Discharge Summary (Signed)
Physician Discharge Summary  THEODORA REGIS [id number] DOB: 07-Nov-1919 DOA: 09/20/2015  PCP: [name], MD  Admit date: 09/20/2015 Discharge date: 09/22/2015  Time spent: 55 minutes    Discharge Condition: stable    Discharge Diagnoses:  Principal Problem:   Anasarca Active Problems:   Essential hypertension   BPH (benign prostatic hyperplasia)   Thrombocytopenia, unspecified (HCC)   History of present illness:  Victor Green is a 81 y.o. male who is bedbound with private sitters, h/o CAD, HTN, HDL, recent admission for acute renal failure/ dehydration presents to the hospital for swelling in his arms and legs.  Hospital Course:  Principal Problem: Anasarca - noted to have dependant edema of arms and legs - albumin 3.1 therefore not secondary to hypoproteinemia - ECHO shows only grade 1 diastolic dysfunction- previously see mod Pulm regurgitationis mild - no other cause for anasarca found - difficult situation- - recently became over diuresed and orthostatic and required admission for IVF - have spoken with patient and his son in detail - will need to find the right fluid balance for him without causing orthostasis - have advised ACE wraps for his legs as he spends most of the day sitting in his wheelchair and elevation of legs when in bed- [facility] requested for ACE wraps - his son has bought a scale which is compatible with his wheelchair and will start checking daily weights- in negative balance by 2.5 L now- I feel today's weight would be an appropriate dry weight for him   Active Problems:  Essential hypertension - BP quite elevated  - have changed Metoprolol to Coreg for better BP control - have also added Hydralazine   -cont Daily Lasix  Hypokalemia - due to lasix- replaced   BPH (benign prostatic hyperplasia) - cont Avodart   Thrombocytopenia, unspecified - chronic  Hypothyroid - cont synthroid    Discharge Exam: Filed Weights   09/20/15  2244 09/21/15 0639 09/22/15 0447  Weight: 92.488 kg (203 lb 14.4 oz) 95.301 kg (210 lb 1.6 oz) 93.441 kg (206 lb)   Filed Vitals:   09/22/15 0447  BP: 184/77  Pulse: 61  Temp: 97.7 F (36.5 C)  Resp: 18    General: AAO x 3, no distress Cardiovascular: RRR, no murmurs  Respiratory: clear to auscultation bilaterally GI: soft, non-tender, non-distended, bowel sound positive Extremities: mild dependant edema in b/l elbows and moderate edema at b/l ankles  Discharge Instructions You were cared for by a hospitalist during your hospital stay. If you have any questions about your discharge medications or the care you received while you were in the hospital after you are discharged, you can call the unit and asked to speak with the hospitalist on call if the hospitalist that took care of you is not available. Once you are discharged, your primary care physician will handle any further medical issues. Please note that NO REFILLS for any discharge medications will be authorized once you are discharged, as it is imperative that you return to your primary care physician (or establish a relationship with a primary care physician if you do not have one) for your aftercare needs so that they can reassess your need for medications and monitor your lab values.      Discharge Instructions    (HEART FAILURE PATIENTS) Call MD:  Anytime you have any of the following symptoms: 1) 3 pound weight gain in 24 hours or 5 pounds in 1 week 2) shortness of breath, with or without a dry hacking  cough 3) swelling in the hands, feet or stomach 4) if you have to sleep on extra pillows at night in order to breathe.    Complete by:  As directed      Diet - low sodium heart healthy    Complete by:  As directed      Discharge instructions    Complete by:  As directed   Weight today is 206 lb- weight yourself at home and try to keep within 2 lb of this weight.  Keep legs elevated higher than stomach when laying in bed      Increase activity slowly    Complete by:  As directed             Medication List    STOP taking these medications        metoprolol tartrate 25 MG tablet  Commonly known as:  LOPRESSOR      TAKE these medications        acetaminophen 325 MG tablet  Commonly known as:  TYLENOL  Take 325 mg by mouth every morning.     ALIGN 4 MG Caps  Take 4 mg by mouth every evening.     atorvastatin 10 MG tablet  Commonly known as:  LIPITOR  Take 1 tablet by mouth  daily     azelastine 0.1 % nasal spray  Commonly known as:  ASTELIN  Place 1 spray into both nostrils 2 (two) times daily. Use in each nostril as directed     B-COMPLEX PO  Take 1 tablet by mouth daily.     brimonidine 0.15 % ophthalmic solution  Commonly known as:  ALPHAGAN  Place 1 drop into the left eye 2 (two) times daily.     carvedilol 6.25 MG tablet  Commonly known as:  COREG  Take 1 tablet (6.25 mg total) by mouth 2 (two) times daily with a meal.     dextromethorphan-guaiFENesin 30-600 MG 12hr tablet  Commonly known as:  MUCINEX DM  Take 1 tablet by mouth 2 (two) times daily as needed for cough.     diazepam 2 MG tablet  Commonly known as:  VALIUM  Take 0.5 tablets (1 mg total) by mouth at bedtime.     diclofenac sodium 1 % Gel  Commonly known as:  VOLTAREN  Apply 3 times per day     dorzolamide 2 % ophthalmic solution  Commonly known as:  TRUSOPT  Place 1 drop into the left eye 2 (two) times daily.     dutasteride 0.5 MG capsule  Commonly known as:  AVODART  Take 1 capsule by mouth  daily     EPIPEN 2-PAK 0.3 mg/0.3 mL Soaj injection  Generic drug:  EPINEPHrine  Inject 0.22ml (0.3mg ) into  the muscle once as directed     ferrous sulfate 325 (65 FE) MG tablet  Take 325 mg by mouth daily with breakfast.     fexofenadine 180 MG tablet  Commonly known as:  ALLEGRA  Take 90 mg by mouth every evening. 1/2 tablet once a day     fluticasone 50 MCG/ACT nasal spray  Commonly known as:  FLONASE  Use  2 sprays in each  nostril twice daily     furosemide 40 MG tablet  Commonly known as:  LASIX  Take 1 tablet daily, if there is swelling take 2 tablets daily until swelling goes down.     glucosamine-chondroitin 500-400 MG tablet  Take 1 tablet by mouth 2 (two) times daily.  hydrALAZINE 10 MG tablet  Commonly known as:  APRESOLINE  Take 1 tablet (10 mg total) by mouth every 8 (eight) hours.     levothyroxine 50 MCG tablet  Commonly known as:  SYNTHROID, LEVOTHROID  Take 1 tablet by mouth  daily     multivitamin capsule  Take 1 capsule by mouth daily.     pantoprazole 40 MG tablet  Commonly known as:  PROTONIX  Take 1 tablet by mouth  daily     potassium chloride 10 MEQ tablet  Commonly known as:  K-DUR  Take 2 tablets (20 mEq total) by mouth daily.     traMADol 50 MG tablet  Commonly known as:  ULTRAM  Half to 1 tablet with food up to 3 times a day for pain       Allergies  Allergen Reactions  . Other Anaphylaxis    Horse serum  . Tetanus Toxoids Other (See Comments)      The results of significant diagnostics from this hospitalization (including imaging, microbiology, ancillary and laboratory) are listed below for reference.    Significant Diagnostic Studies: Dg Chest 2 View  09/20/2015  CLINICAL DATA:  Cough and sob x 1 month EXAM: CHEST  2 VIEW COMPARISON:  09/15/2015 FINDINGS: Lateral view degraded by patient arm position. Moderate right hemidiaphragm elevation. High-riding humeral heads, consistent with chronic rotator cuff insufficiency. Patient minimally rotated to the right on the frontal. Cardiomegaly with a tortuous thoracic aorta. No pleural effusion or pneumothorax. Atelectasis at the lung bases, worse on the right. No congestive failure. Transverse aortic atherosclerosis. IMPRESSION: No acute cardiopulmonary disease. Cardiomegaly without congestive failure. Right hemidiaphragm elevation with bibasilar atelectasis, worse at the right lung base.  Electronically Signed   By: Abigail Miyamoto M.D.   On: 09/20/2015 20:24   Dg Chest 2 View  09/15/2015  CLINICAL DATA:  Upper and lower extremity swelling beginning this morning. No chest complaints. EXAM: CHEST  2 VIEW COMPARISON:  PA and lateral chest 11/22/2012. FINDINGS: The lungs are clear. Heart size is upper normal. There is very small bilateral pleural effusions. The lungs are clear without evidence of pulmonary edema. Asymmetric elevation of the right hemidiaphragm is noted. IMPRESSION: Very small bilateral pleural effusions. Negative for pulmonary edema. Atherosclerosis. Electronically Signed   By: Inge Rise M.D.   On: 09/15/2015 14:51    Microbiology: Recent Results (from the past 240 hour(s))  Urine culture     Status: None   Collection Time: 09/15/15  2:08 PM  Result Value Ref Range Status   Specimen Description URINE, CLEAN CATCH  Final   Special Requests NONE  Final   Culture MULTIPLE SPECIES PRESENT, SUGGEST RECOLLECTION  Final   Report Status 09/16/2015 FINAL  Final     Labs: Basic Metabolic Panel:  Recent Labs Lab 09/20/15 1901 09/21/15 0316 09/22/15 0234  NA 142 141 141  K 3.7 3.2* 3.6  CL 102 100* 101  CO2 28 32 32  GLUCOSE 131* 123* 98  BUN 38* 38* 33*  CREATININE 1.13 1.16 1.15  CALCIUM 8.6* 8.3* 8.5*   Liver Function Tests: No results for input(s): AST, ALT, ALKPHOS, BILITOT, PROT, ALBUMIN in the last 168 hours. No results for input(s): LIPASE, AMYLASE in the last 168 hours. No results for input(s): AMMONIA in the last 168 hours. CBC:  Recent Labs Lab 09/20/15 1901  WBC 5.7  HGB 9.9*  HCT 31.7*  MCV 102.3*  PLT 78*   Cardiac Enzymes: No results for input(s): CKTOTAL, CKMB,  CKMBINDEX, TROPONINI in the last 168 hours. BNP: BNP (last 3 results)  Recent Labs  09/15/15 1426 09/20/15 1938  BNP 221.4* 443.0*    ProBNP (last 3 results) No results for input(s): PROBNP in the last 8760 hours.  CBG: No results for input(s): GLUCAP in  the last 168 hours.     SignedDebbe Odea, MD [facility] 09/22/2015, 3:20 PM

## 2015-09-22 NOTE — Progress Notes (Signed)
*  PRELIMINARY RESULTS* Echocardiogram 2D Echocardiogram has been performed.  Victor Green 09/22/2015, 9:42 AM

## 2015-09-22 NOTE — Progress Notes (Signed)
Pt d/c'd to home with son. Son to transport. D/c instructions given to and d/w son. Family verbalizes understanding

## 2015-09-23 NOTE — Telephone Encounter (Signed)
I called patient back Friday and told nurse to take him to the ER, son wasn't there.

## 2015-09-25 ENCOUNTER — Other Ambulatory Visit: Payer: Self-pay | Admitting: *Deleted

## 2015-09-25 MED ORDER — PANTOPRAZOLE SODIUM 40 MG PO TBEC: DELAYED_RELEASE_TABLET | ORAL | Status: AC

## 2015-09-30 ENCOUNTER — Other Ambulatory Visit: Payer: Self-pay | Admitting: *Deleted

## 2015-09-30 ENCOUNTER — Ambulatory Visit: Payer: 59 | Admitting: Endocrinology

## 2015-09-30 MED ORDER — POTASSIUM CHLORIDE ER 10 MEQ PO TBCR: 20.0000 meq | EXTENDED_RELEASE_TABLET | Freq: Every day | ORAL | Status: DC

## 2015-10-07 ENCOUNTER — Ambulatory Visit: Payer: 59 | Admitting: Endocrinology

## 2015-10-07 DIAGNOSIS — Z0289 Encounter for other administrative examinations: Secondary | ICD-10-CM

## 2015-10-11 ENCOUNTER — Other Ambulatory Visit: Payer: Self-pay | Admitting: *Deleted

## 2015-10-11 MED ORDER — DIAZEPAM 2 MG PO TABS: 1.0000 mg | ORAL_TABLET | Freq: Every day | ORAL | Status: AC

## 2015-11-04 ENCOUNTER — Emergency Department (HOSPITAL_COMMUNITY)
Admission: EM | Admit: 2015-11-04 | Discharge: 2015-11-04 | Disposition: A | Discharge status: Home / Self Care | Payer: Medicare Other | Attending: Emergency Medicine | Admitting: Emergency Medicine

## 2015-11-04 ENCOUNTER — Emergency Department (HOSPITAL_COMMUNITY): Payer: Medicare Other

## 2015-11-04 ENCOUNTER — Encounter (HOSPITAL_COMMUNITY): Payer: Self-pay

## 2015-11-04 ENCOUNTER — Emergency Department (EMERGENCY_DEPARTMENT_HOSPITAL): Payer: Medicare Other

## 2015-11-04 DIAGNOSIS — Z8719 Personal history of other diseases of the digestive system: Secondary | ICD-10-CM | POA: Insufficient documentation

## 2015-11-04 DIAGNOSIS — R05 Cough: Secondary | ICD-10-CM | POA: Insufficient documentation

## 2015-11-04 DIAGNOSIS — M7989 Other specified soft tissue disorders: Secondary | ICD-10-CM

## 2015-11-04 DIAGNOSIS — R600 Localized edema: Secondary | ICD-10-CM | POA: Insufficient documentation

## 2015-11-04 DIAGNOSIS — M170 Bilateral primary osteoarthritis of knee: Secondary | ICD-10-CM | POA: Insufficient documentation

## 2015-11-04 DIAGNOSIS — J45909 Unspecified asthma, uncomplicated: Secondary | ICD-10-CM | POA: Diagnosis not present

## 2015-11-04 DIAGNOSIS — Z7951 Long term (current) use of inhaled steroids: Secondary | ICD-10-CM | POA: Diagnosis not present

## 2015-11-04 DIAGNOSIS — E669 Obesity, unspecified: Secondary | ICD-10-CM | POA: Diagnosis not present

## 2015-11-04 DIAGNOSIS — Z79899 Other long term (current) drug therapy: Secondary | ICD-10-CM | POA: Diagnosis not present

## 2015-11-04 DIAGNOSIS — H409 Unspecified glaucoma: Secondary | ICD-10-CM | POA: Insufficient documentation

## 2015-11-04 DIAGNOSIS — E785 Hyperlipidemia, unspecified: Secondary | ICD-10-CM | POA: Diagnosis not present

## 2015-11-04 DIAGNOSIS — R2243 Localized swelling, mass and lump, lower limb, bilateral: Secondary | ICD-10-CM | POA: Diagnosis present

## 2015-11-04 DIAGNOSIS — I2510 Atherosclerotic heart disease of native coronary artery without angina pectoris: Secondary | ICD-10-CM | POA: Diagnosis not present

## 2015-11-04 DIAGNOSIS — R601 Generalized edema: Secondary | ICD-10-CM

## 2015-11-04 DIAGNOSIS — R059 Cough, unspecified: Secondary | ICD-10-CM

## 2015-11-04 DIAGNOSIS — I10 Essential (primary) hypertension: Secondary | ICD-10-CM | POA: Insufficient documentation

## 2015-11-04 LAB — COMPREHENSIVE METABOLIC PANEL
ALBUMIN: 3.1 g/dL — AB (ref 3.5–5.0)
ALK PHOS: 65 U/L (ref 38–126)
ALT: 26 U/L (ref 17–63)
ANION GAP: 12 (ref 5–15)
AST: 31 U/L (ref 15–41)
BILIRUBIN TOTAL: 1 mg/dL (ref 0.3–1.2)
BUN: 55 mg/dL — AB (ref 6–20)
CALCIUM: 9.1 mg/dL (ref 8.9–10.3)
CO2: 33 mmol/L — AB (ref 22–32)
Chloride: 96 mmol/L — ABNORMAL LOW (ref 101–111)
Creatinine, Ser: 1.35 mg/dL — ABNORMAL HIGH (ref 0.61–1.24)
GFR calc Af Amer: 50 mL/min — ABNORMAL LOW (ref >60)
GFR calc non Af Amer: 44 mL/min — ABNORMAL LOW (ref >60)
GLUCOSE: 139 mg/dL — AB (ref 65–99)
Potassium: 3.3 mmol/L — ABNORMAL LOW (ref 3.5–5.1)
SODIUM: 141 mmol/L (ref 135–145)
Total Protein: 5.7 g/dL — ABNORMAL LOW (ref 6.5–8.1)

## 2015-11-04 LAB — URINALYSIS, ROUTINE W REFLEX MICROSCOPIC
Bilirubin Urine: NEGATIVE
GLUCOSE, UA: NEGATIVE mg/dL
Hgb urine dipstick: NEGATIVE
Ketones, ur: NEGATIVE mg/dL
LEUKOCYTES UA: NEGATIVE
Nitrite: NEGATIVE
PH: 5.5 (ref 5.0–8.0)
Protein, ur: NEGATIVE mg/dL
Specific Gravity, Urine: 1.012 (ref 1.005–1.030)

## 2015-11-04 LAB — CBC WITH DIFFERENTIAL/PLATELET
Basophils Absolute: 0 10*3/uL (ref 0.0–0.1)
Basophils Relative: 0 %
EOS ABS: 0 10*3/uL (ref 0.0–0.7)
Eosinophils Relative: 0 %
HEMATOCRIT: 28.3 % — AB (ref 39.0–52.0)
HEMOGLOBIN: 9 g/dL — AB (ref 13.0–17.0)
LYMPHS ABS: 1.5 10*3/uL (ref 0.7–4.0)
Lymphocytes Relative: 17 %
MCH: 33 pg (ref 26.0–34.0)
MCHC: 31.8 g/dL (ref 30.0–36.0)
MCV: 103.7 fL — ABNORMAL HIGH (ref 78.0–100.0)
MONO ABS: 1.1 10*3/uL — AB (ref 0.1–1.0)
MONOS PCT: 11 %
NEUTROS PCT: 72 %
Neutro Abs: 6.7 10*3/uL (ref 1.7–7.7)
Platelets: 69 10*3/uL — ABNORMAL LOW (ref 150–400)
RBC: 2.73 MIL/uL — ABNORMAL LOW (ref 4.22–5.81)
RDW: 18.9 % — AB (ref 11.5–15.5)
WBC: 9.4 10*3/uL (ref 4.0–10.5)

## 2015-11-04 LAB — BRAIN NATRIURETIC PEPTIDE: B Natriuretic Peptide: 162.6 pg/mL — ABNORMAL HIGH (ref 0.0–100.0)

## 2015-11-04 LAB — I-STAT TROPONIN, ED: Troponin i, poc: 0.01 ng/mL (ref 0.00–0.08)

## 2015-11-04 MED ORDER — GUAIFENESIN ER 600 MG PO TB12: 600.0000 mg | ORAL_TABLET | Freq: Two times a day (BID) | ORAL | Status: DC | PRN

## 2015-11-04 NOTE — ED Provider Notes (Signed)
CSN: [id number]     Arrival date & time 11/04/15  1501 History   First MD Initiated Contact with Patient 11/04/15 1504     Chief Complaint  Patient presents with  . Cough  . Leg Swelling   HPI  Victor Green is an 82 y.o. male with history of CAD, HTN, anasarca who presents to the ED for evaluation of cough and leg swelling. Pt has notably been evaluated multiple times over the past few months, including admission most recently in 09/2015, for evaluation of leg swelling and SOB. Echo from 08/2015 shows normal LVEF and so CHF was excluded as the cause of pt's symptoms. His swelling had been thought to be due to hypoalbuminemia but that was ruled out. Pt is followed by a home-health aid and nurse practitioner who assist in daily ACE wraps on his legs. Pt states that they thought his legs looked more swollen recently and the NP was concerned about possible DVT as pt is essentially immobile at baseline. Pt states he is unsure if his legs are more swollen than typical. Denies leg pain. States he has noticed a new cough over the past few days and feeling like his chest is congested. Pt notably was hypoxic to 88% with EMS, improved to the mid 90s with 2L of O2 by [place]. Pt states that he typically does not have an oxygen requirement and does not use supplemental oxygen at home. He currently denies chest pain. Denies SOB. Denies abd pain, N/V/D, fever, chills.    Past Medical History  Diagnosis Date  . Hypertension   . CAD (coronary artery disease) complete w/u in 2009    extensive coronary calcification by EBCT, normal myoview, echo with EF 70% mild AI  . Hyperlipidemia   . Aortic insufficiency     echo July 2011 EF 60%  . 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 History of GI bleed 2003    due to pyloric-positive ulcer   . Diverticulosis     with remote history of diverticulitis  . Plantar fasciitis     chronic  . Allergic rhinitis     responding well to desensitation injections  .  Glaucoma   . Osteoarthritis     both knees  . Asthma    Past Surgical History  Procedure Laterality Date  . Tonsillectomy    . Hemorrhoid surgery  1996  . Hernia repair  1996    right  . Lumbar laminectomy  2003  . Cataract extraction      left eye   Family History  Problem Relation Age of Onset  . Thrombosis Father     mesenteric artery thrombosis after prostate surgery  . Colon cancer Mother    Social History  Substance Use Topics  . Smoking status: Never Smoker   . Smokeless tobacco: None  . Alcohol Use: No    Review of Systems  All other systems reviewed and are negative.     Allergies  Other and Tetanus toxoids  Home Medications   Prior to Admission medications   Medication Sig Start Date End Date Taking? Authorizing Provider  acetaminophen (TYLENOL) 325 MG tablet Take 325 mg by mouth every morning.    Yes Historical Provider, MD  atorvastatin (LIPITOR) 10 MG tablet Take 1 tablet by mouth  daily Patient taking differently: Take 0.5  tablet by mouth daily at bedtime 07/09/15  Yes [name], MD  azelastine (ASTELIN) 0.1 % nasal spray Place  1 spray into both nostrils 2 (two) times daily. Use in each nostril as directed Patient taking differently: Place 1 spray into both nostrils at bedtime. Use in each nostril as directed 08/20/14  Yes [name], MD  B Complex-Biotin-FA (B-COMPLEX PO) Take 1 tablet by mouth daily.    Yes Historical Provider, MD  brimonidine (ALPHAGAN) 0.15 % ophthalmic solution Place 1 drop into the left eye 2 (two) times daily.    Yes Historical Provider, MD  carvedilol (COREG) 6.25 MG tablet Take 1 tablet (6.25 mg total) by mouth 2 (two) times daily with a meal. 09/22/15  Yes Debbe Odea, MD  dextromethorphan-guaiFENesin (MUCINEX DM) 30-600 MG 12hr tablet Take 1 tablet by mouth 2 (two) times daily as needed for cough. 09/22/15  Yes Debbe Odea, MD  diazepam (VALIUM) 2 MG tablet Take 0.5 tablets (1 mg total) by mouth at bedtime. 10/11/15  Yes  [name], MD  diclofenac sodium (VOLTAREN) 1 % GEL Apply 3 times per day Patient taking differently: Apply 2 g topically daily as needed (for right knee). Apply 3 times per day 08/16/15  Yes [name], MD  dorzolamide (TRUSOPT) 2 % ophthalmic solution Place 1 drop into the left eye 2 (two) times daily.    Yes Historical Provider, MD  dutasteride (AVODART) 0.5 MG capsule Take 1 capsule by mouth  daily Patient taking differently: Take 1 capsule by mouth  daily in evening 07/03/15  Yes [name], MD  ferrous sulfate 325 (65 FE) MG tablet Take 325 mg by mouth daily with breakfast.   Yes Historical Provider, MD  fexofenadine (ALLEGRA) 180 MG tablet Take 90 mg by mouth every evening. 1/2 tablet once a day   Yes Historical Provider, MD  fluticasone (FLONASE) 50 MCG/ACT nasal spray Use 2 sprays in each  nostril twice daily 08/21/15  Yes [name], MD  furosemide (LASIX) 40 MG tablet Take 1 tablet daily, if there is swelling take 2 tablets daily until swelling goes down. 09/18/15  Yes [name], MD  glucosamine-chondroitin 500-400 MG tablet Take 1 tablet by mouth 2 (two) times daily.    Yes Historical Provider, MD  hydrALAZINE (APRESOLINE) 10 MG tablet Take 1 tablet (10 mg total) by mouth every 8 (eight) hours. 09/22/15  Yes Debbe Odea, MD  levothyroxine (SYNTHROID, LEVOTHROID) 50 MCG tablet Take 1 tablet by mouth  daily Patient taking differently: Take 0.5 tablet (9mcg) by mouth  daily 07/01/15  Yes [name], MD  metolazone (ZAROXOLYN) 2.5 MG tablet Take 2.5 mg by mouth daily.   Yes Historical Provider, MD  Multiple Vitamin (MULTIVITAMIN) capsule Take 1 capsule by mouth daily.     Yes Historical Provider, MD  pantoprazole (PROTONIX) 40 MG tablet Take 1 tablet by mouth  daily 09/25/15  Yes [name], MD  potassium chloride (K-DUR) 10 MEQ tablet Take 2 tablets (20 mEq total) by mouth daily. 09/30/15  Yes [name], MD  Probiotic Product (ALIGN) 4 MG CAPS Take 4 mg by mouth every evening.   Yes  Historical Provider, MD  traMADol (ULTRAM) 50 MG tablet Half to 1 tablet with food up to 3 times a day for pain Patient taking differently: Take 25-50 mg by mouth 3 (three) times daily as needed for moderate pain. Half to 1 tablet with food up to 3 times a day for pain 09/16/15  Yes [name], MD  EPIPEN 2-PAK 0.3 MG/0.3ML SOAJ injection Inject 0.72ml (0.3mg ) into  the muscle once as directed 12/26/14   [name],  MD   BP 134/63 mmHg  Pulse 66  Temp(Src) 99.2 F (37.3 C) (Oral)  Resp 13  Ht 5\' 10"  (1.778 m)  Wt 93.441 kg  BMI 29.56 kg/m2  SpO2 99% Physical Exam  Constitutional: He is oriented to person, place, and time. No distress.  Obese/anasarcic  HENT:  Right Ear: External ear normal.  Left Ear: External ear normal.  Nose: Nose normal.  Mouth/Throat: Oropharynx is clear and moist. No oropharyngeal exudate.  Eyes: Conjunctivae and EOM are normal. Pupils are equal, round, and reactive to light.  Neck: Normal range of motion. Neck supple.  Cardiovascular: Normal rate, regular rhythm, normal heart sounds and intact distal pulses.   Pulmonary/Chest: Effort normal. No respiratory distress.  Few soft crackles bilaterally that clear with coughing  Abdominal: Soft. Bowel sounds are normal. He exhibits no distension. There is no tenderness.  Musculoskeletal:  1-2+ pitting edema in bilateral LE  Neurological: He is alert and oriented to person, place, and time. No cranial nerve deficit.  Skin: Skin is warm and dry. He is not diaphoretic.  Psychiatric: He has a normal mood and affect.  Nursing note and vitals reviewed.   ED Course  Procedures (including critical care time) Labs Review Labs Reviewed  COMPREHENSIVE METABOLIC PANEL - Abnormal; Notable for the following:    Potassium 3.3 (*)    Chloride 96 (*)    CO2 33 (*)    Glucose, Bld 139 (*)    BUN 55 (*)    Creatinine, Ser 1.35 (*)    Total Protein 5.7 (*)    Albumin 3.1 (*)    GFR calc non Af Amer 44 (*)    GFR calc Af  Amer 50 (*)    All other components within normal limits  BRAIN NATRIURETIC PEPTIDE - Abnormal; Notable for the following:    B Natriuretic Peptide 162.6 (*)    All other components within normal limits  CBC WITH DIFFERENTIAL/PLATELET - Abnormal; Notable for the following:    RBC 2.73 (*)    Hemoglobin 9.0 (*)    HCT 28.3 (*)    MCV 103.7 (*)    RDW 18.9 (*)    Platelets 69 (*)    Monocytes Absolute 1.1 (*)    All other components within normal limits  URINALYSIS, ROUTINE W REFLEX MICROSCOPIC (NOT AT [facility])  Randolm Idol, ED    Imaging Review Dg Chest 2 View  11/04/2015  CLINICAL DATA:  c/o new onset wet cough and worsening leg swelling x2 weeks per home health care NP. Upon arrival pt. O2 saturation 88%. EMS gave 2L Oxygen via [place], which brought O2 up to 95%. Pt. Takes 40mg  lasix for protein issues. Pt. Denies C.*comment was truncated* EXAM: CHEST  2 VIEW COMPARISON:  09/20/2015 FINDINGS: Elevated right hemidiaphragm with bandlike opacity medially at the right lung base favoring atelectasis. Mild to moderate enlargement of the cardiopericardial silhouette without edema. Atherosclerotic aortic arch. The patient is rotated to the right on today's radiograph, reducing diagnostic sensitivity and specificity. There is evidence of chronic rotator cuff tear on the right with elimination of the acromiohumeral space. Lower thoracic levoconvex scoliosis with associated spondylosis. IMPRESSION: 1. Elevated right hemidiaphragm with increased bandlike opacity medially at the right lung base favoring atelectasis. 2. Mild to moderate enlargement of the cardiopericardial silhouette without edema. 3. Atherosclerosis. 4. Chronic right rotator cuff tear. Electronically Signed   By: Van Clines M.D.   On: 11/04/2015 16:55   I have personally reviewed and evaluated these  images and lab results as part of my medical decision-making.   EKG Interpretation None      MDM   Final diagnoses:   Generalized edema  Cough    Workup unremarkable. No evidence of DVT. CXR shows atelectasis but otherwise no evidence of pulmonary edema or infiltrate. Labs otherwise unremarkable and at pt's baseline. Unclear etiology of pt's continued edema. Pt maintaining good SpO2 on room air. Pt's caregiver is in the room now and states his baseline O2 is 92-94%. Will d/c home with reassurance and outpatient follow up. Will give rx for mucinex for cough. ER return precautions given.     Anne Ng, PA-C 11/04/15 1859  Charlesetta Shanks, MD 11/06/15 [phone]

## 2015-11-04 NOTE — Discharge Instructions (Signed)
Your workup today was normal. We are still unsure what exactly is causing your swelling. Please continue to take all of your prescriptions as prescribed. Please follow-up with your primary care provider for ongoing evaluation. Return to the ER for new or worsening symptoms.

## 2015-11-04 NOTE — ED Notes (Signed)
Pt. Coming from home via [facility] c/o new onset wet cough and worsening leg swelling x2 weeks per home health care NP. Upon arrival pt. O2 saturation 88%. EMS gave 2L Oxygen via [place], which brought O2 up to 95%. Pt. Takes 40mg  lasix for protein issues. Pt. Denies CHF. Pt. Also denies worsening swelling of legs. Pt. Denies SOB/CP. Pt. AOx4 at this time.

## 2015-11-04 NOTE — Progress Notes (Signed)
VASCULAR LAB PRELIMINARY  PRELIMINARY  PRELIMINARY  PRELIMINARY  Bilateral lower extremity venous duplex completed.    Preliminary report:  Bilateral:  No evidence of DVT or superficial thrombosis. There is an area of mixed echoes in the right popliteal fossa consistent with a possible Baker's cyst. There is no Baker's cyst on the left. Mild interstitial fluid is noted throughout the lower extremity bilaterally.   Victor Green, [place], RVS 11/04/2015, 5:16 PM

## 2015-11-07 ENCOUNTER — Telehealth: Payer: Self-pay | Admitting: Endocrinology

## 2015-11-07 NOTE — Telephone Encounter (Signed)
Please see below and advise.

## 2015-11-07 NOTE — Telephone Encounter (Signed)
Verbal order given, message left for son to return call to talk about hospice

## 2015-11-07 NOTE — Telephone Encounter (Signed)
Hailey from [facility] called stated patient health is declining, family would like a nurse to come out 3 x a week, need a verbal order. Please advise [phone]

## 2015-11-07 NOTE — Telephone Encounter (Signed)
Okay.  Since patient is unlikely to be seen in the office will need to have hospice take over, have family approve this

## 2015-12-03 ENCOUNTER — Encounter (HOSPITAL_COMMUNITY): Payer: Self-pay | Admitting: Emergency Medicine

## 2015-12-03 ENCOUNTER — Inpatient Hospital Stay (HOSPITAL_COMMUNITY)
Admission: EM | Admit: 2015-12-03 | Discharge: 2015-12-09 | DRG: 291 | Disposition: A | Discharge status: Home Health | Payer: Medicare Other | Attending: Internal Medicine | Admitting: Internal Medicine

## 2015-12-03 ENCOUNTER — Emergency Department (HOSPITAL_COMMUNITY): Payer: Medicare Other

## 2015-12-03 DIAGNOSIS — N179 Acute kidney failure, unspecified: Secondary | ICD-10-CM | POA: Diagnosis not present

## 2015-12-03 DIAGNOSIS — N400 Enlarged prostate without lower urinary tract symptoms: Secondary | ICD-10-CM | POA: Diagnosis present

## 2015-12-03 DIAGNOSIS — I2510 Atherosclerotic heart disease of native coronary artery without angina pectoris: Secondary | ICD-10-CM | POA: Diagnosis present

## 2015-12-03 DIAGNOSIS — I509 Heart failure, unspecified: Secondary | ICD-10-CM | POA: Insufficient documentation

## 2015-12-03 DIAGNOSIS — I10 Essential (primary) hypertension: Secondary | ICD-10-CM

## 2015-12-03 DIAGNOSIS — Z800 Family history of malignant neoplasm of digestive organs: Secondary | ICD-10-CM | POA: Diagnosis not present

## 2015-12-03 DIAGNOSIS — I130 Hypertensive heart and chronic kidney disease with heart failure and stage 1 through stage 4 chronic kidney disease, or unspecified chronic kidney disease: Principal | ICD-10-CM | POA: Diagnosis present

## 2015-12-03 DIAGNOSIS — Z9981 Dependence on supplemental oxygen: Secondary | ICD-10-CM

## 2015-12-03 DIAGNOSIS — D696 Thrombocytopenia, unspecified: Secondary | ICD-10-CM | POA: Diagnosis present

## 2015-12-03 DIAGNOSIS — H409 Unspecified glaucoma: Secondary | ICD-10-CM | POA: Diagnosis present

## 2015-12-03 DIAGNOSIS — R600 Localized edema: Secondary | ICD-10-CM | POA: Diagnosis present

## 2015-12-03 DIAGNOSIS — D631 Anemia in chronic kidney disease: Secondary | ICD-10-CM | POA: Diagnosis present

## 2015-12-03 DIAGNOSIS — I878 Other specified disorders of veins: Secondary | ICD-10-CM | POA: Diagnosis present

## 2015-12-03 DIAGNOSIS — N183 Chronic kidney disease, stage 3 (moderate): Secondary | ICD-10-CM | POA: Diagnosis present

## 2015-12-03 DIAGNOSIS — Z79899 Other long term (current) drug therapy: Secondary | ICD-10-CM

## 2015-12-03 DIAGNOSIS — I5032 Chronic diastolic (congestive) heart failure: Secondary | ICD-10-CM | POA: Diagnosis present

## 2015-12-03 DIAGNOSIS — E785 Hyperlipidemia, unspecified: Secondary | ICD-10-CM | POA: Diagnosis present

## 2015-12-03 DIAGNOSIS — Z6830 Body mass index (BMI) 30.0-30.9, adult: Secondary | ICD-10-CM | POA: Diagnosis not present

## 2015-12-03 DIAGNOSIS — I5033 Acute on chronic diastolic (congestive) heart failure: Secondary | ICD-10-CM | POA: Diagnosis present

## 2015-12-03 DIAGNOSIS — E669 Obesity, unspecified: Secondary | ICD-10-CM | POA: Diagnosis present

## 2015-12-03 HISTORY — DX: Heart failure, unspecified: I50.9

## 2015-12-03 LAB — CBC WITH DIFFERENTIAL/PLATELET
BASOS PCT: 0 %
Basophils Absolute: 0 10*3/uL (ref 0.0–0.1)
Basophils Absolute: 0 10*3/uL (ref 0.0–0.1)
Basophils Relative: 0 %
EOS PCT: 2 %
EOS PCT: 1 %
Eosinophils Absolute: 0.1 10*3/uL (ref 0.0–0.7)
Eosinophils Absolute: 0.1 10*3/uL (ref 0.0–0.7)
HEMATOCRIT: 28.5 % — AB (ref 39.0–52.0)
HEMATOCRIT: 27 % — AB (ref 39.0–52.0)
HEMOGLOBIN: 8.3 g/dL — AB (ref 13.0–17.0)
Hemoglobin: 8.7 g/dL — ABNORMAL LOW (ref 13.0–17.0)
LYMPHS ABS: 2.2 10*3/uL (ref 0.7–4.0)
LYMPHS PCT: 45 %
Lymphocytes Relative: 43 %
Lymphs Abs: 2.4 10*3/uL (ref 0.7–4.0)
MCH: 32 pg (ref 26.0–34.0)
MCH: 31.9 pg (ref 26.0–34.0)
MCHC: 30.5 g/dL (ref 30.0–36.0)
MCHC: 30.7 g/dL (ref 30.0–36.0)
MCV: 104.8 fL — AB (ref 78.0–100.0)
MCV: 103.8 fL — ABNORMAL HIGH (ref 78.0–100.0)
MONOS PCT: 12 %
Monocytes Absolute: 0.6 10*3/uL (ref 0.1–1.0)
Monocytes Absolute: 0.6 10*3/uL (ref 0.1–1.0)
Monocytes Relative: 12 %
NEUTROS PCT: 41 %
NEUTROS PCT: 44 %
Neutro Abs: 2.1 10*3/uL (ref 1.7–7.7)
Neutro Abs: 2.2 10*3/uL (ref 1.7–7.7)
PLATELETS: 53 10*3/uL — AB (ref 150–400)
Platelets: 57 10*3/uL — ABNORMAL LOW (ref 150–400)
RBC: 2.72 MIL/uL — ABNORMAL LOW (ref 4.22–5.81)
RBC: 2.6 MIL/uL — AB (ref 4.22–5.81)
RDW: 18.5 % — ABNORMAL HIGH (ref 11.5–15.5)
RDW: 18.5 % — ABNORMAL HIGH (ref 11.5–15.5)
WBC: 5.2 10*3/uL (ref 4.0–10.5)
WBC: 5.1 10*3/uL (ref 4.0–10.5)

## 2015-12-03 LAB — COMPREHENSIVE METABOLIC PANEL
ALT: 18 U/L (ref 17–63)
AST: 24 U/L (ref 15–41)
Albumin: 3.3 g/dL — ABNORMAL LOW (ref 3.5–5.0)
Alkaline Phosphatase: 55 U/L (ref 38–126)
Anion gap: 6 (ref 5–15)
BUN: 33 mg/dL — AB (ref 6–20)
CHLORIDE: 103 mmol/L (ref 101–111)
CO2: 32 mmol/L (ref 22–32)
Calcium: 8.8 mg/dL — ABNORMAL LOW (ref 8.9–10.3)
Creatinine, Ser: 1.18 mg/dL (ref 0.61–1.24)
GFR calc Af Amer: 59 mL/min — ABNORMAL LOW (ref >60)
GFR, EST NON AFRICAN AMERICAN: 51 mL/min — AB (ref >60)
Glucose, Bld: 102 mg/dL — ABNORMAL HIGH (ref 65–99)
POTASSIUM: 3.5 mmol/L (ref 3.5–5.1)
SODIUM: 141 mmol/L (ref 135–145)
Total Bilirubin: 0.3 mg/dL (ref 0.3–1.2)
Total Protein: 6.1 g/dL — ABNORMAL LOW (ref 6.5–8.1)

## 2015-12-03 LAB — BASIC METABOLIC PANEL
ANION GAP: 12 (ref 5–15)
BUN: 34 mg/dL — ABNORMAL HIGH (ref 6–20)
CO2: 31 mmol/L (ref 22–32)
Calcium: 8.9 mg/dL (ref 8.9–10.3)
Chloride: 98 mmol/L — ABNORMAL LOW (ref 101–111)
Creatinine, Ser: 1.21 mg/dL (ref 0.61–1.24)
GFR, EST AFRICAN AMERICAN: 58 mL/min — AB (ref >60)
GFR, EST NON AFRICAN AMERICAN: 50 mL/min — AB (ref >60)
GLUCOSE: 124 mg/dL — AB (ref 65–99)
POTASSIUM: 4.2 mmol/L (ref 3.5–5.1)
Sodium: 141 mmol/L (ref 135–145)

## 2015-12-03 LAB — I-STAT TROPONIN, ED: Troponin i, poc: 0.01 ng/mL (ref 0.00–0.08)

## 2015-12-03 LAB — TROPONIN I: TROPONIN I: 0.03 ng/mL (ref <0.031)

## 2015-12-03 LAB — BRAIN NATRIURETIC PEPTIDE: B NATRIURETIC PEPTIDE 5: 240.6 pg/mL — AB (ref 0.0–100.0)

## 2015-12-03 LAB — URINALYSIS, ROUTINE W REFLEX MICROSCOPIC
Bilirubin Urine: NEGATIVE
Glucose, UA: NEGATIVE mg/dL
HGB URINE DIPSTICK: NEGATIVE
KETONES UR: NEGATIVE mg/dL
Leukocytes, UA: NEGATIVE
Nitrite: NEGATIVE
PROTEIN: NEGATIVE mg/dL
Specific Gravity, Urine: 1.012 (ref 1.005–1.030)
pH: 7 (ref 5.0–8.0)

## 2015-12-03 MED ORDER — FUROSEMIDE 10 MG/ML IJ SOLN
40.0000 mg | Freq: Two times a day (BID) | INTRAMUSCULAR | Status: DC
  Administered 2015-12-03 – 2015-12-04 (×2): 40 mg via INTRAVENOUS
  Filled 2015-12-03 (×2): qty 4

## 2015-12-03 MED ORDER — PANTOPRAZOLE SODIUM 40 MG PO TBEC
40.0000 mg | DELAYED_RELEASE_TABLET | Freq: Every day | ORAL | Status: DC
  Administered 2015-12-03 – 2015-12-09 (×7): 40 mg via ORAL
  Filled 2015-12-03 (×6): qty 1

## 2015-12-03 MED ORDER — CARVEDILOL 6.25 MG PO TABS: 6.2500 mg | ORAL_TABLET | Freq: Two times a day (BID) | ORAL | Status: DC

## 2015-12-03 MED ORDER — ONDANSETRON HCL 4 MG PO TABS: 4.0000 mg | ORAL_TABLET | Freq: Four times a day (QID) | ORAL | Status: DC | PRN

## 2015-12-03 MED ORDER — CARVEDILOL 6.25 MG PO TABS
6.2500 mg | ORAL_TABLET | Freq: Two times a day (BID) | ORAL | Status: DC
  Administered 2015-12-03 – 2015-12-09 (×13): 6.25 mg via ORAL
  Filled 2015-12-03 (×13): qty 1

## 2015-12-03 MED ORDER — ENOXAPARIN SODIUM 40 MG/0.4ML ~~LOC~~ SOLN: 40.0000 mg | SUBCUTANEOUS | Status: DC

## 2015-12-03 MED ORDER — DORZOLAMIDE HCL 2 % OP SOLN
1.0000 [drp] | Freq: Two times a day (BID) | OPHTHALMIC | Status: DC
  Administered 2015-12-03 – 2015-12-09 (×13): 1 [drp] via OPHTHALMIC
  Filled 2015-12-03: qty 10

## 2015-12-03 MED ORDER — HYDRALAZINE HCL 10 MG PO TABS
10.0000 mg | ORAL_TABLET | Freq: Three times a day (TID) | ORAL | Status: DC
  Filled 2015-12-03: qty 1

## 2015-12-03 MED ORDER — BRIMONIDINE TARTRATE 0.2 % OP SOLN
1.0000 [drp] | Freq: Two times a day (BID) | OPHTHALMIC | Status: DC
  Administered 2015-12-03 – 2015-12-09 (×13): 1 [drp] via OPHTHALMIC
  Filled 2015-12-03: qty 5

## 2015-12-03 MED ORDER — LORATADINE 10 MG PO TABS
10.0000 mg | ORAL_TABLET | Freq: Every day | ORAL | Status: DC
  Administered 2015-12-03 – 2015-12-09 (×7): 10 mg via ORAL
  Filled 2015-12-03 (×7): qty 1

## 2015-12-03 MED ORDER — FERROUS SULFATE 325 (65 FE) MG PO TABS
325.0000 mg | ORAL_TABLET | Freq: Every day | ORAL | Status: DC
  Administered 2015-12-03 – 2015-12-09 (×7): 325 mg via ORAL
  Filled 2015-12-03 (×7): qty 1

## 2015-12-03 MED ORDER — FERROUS SULFATE 325 (65 FE) MG PO TABS: 325.0000 mg | ORAL_TABLET | Freq: Every day | ORAL | Status: DC

## 2015-12-03 MED ORDER — FLUTICASONE PROPIONATE 50 MCG/ACT NA SUSP
2.0000 | Freq: Every day | NASAL | Status: DC
  Administered 2015-12-03 – 2015-12-09 (×7): 2 via NASAL
  Filled 2015-12-03: qty 16

## 2015-12-03 MED ORDER — LOSARTAN POTASSIUM 25 MG PO TABS
12.5000 mg | ORAL_TABLET | Freq: Every day | ORAL | Status: DC
  Administered 2015-12-04: 12.5 mg via ORAL
  Filled 2015-12-03: qty 1

## 2015-12-03 MED ORDER — AZELASTINE HCL 0.1 % NA SOLN
1.0000 | Freq: Every day | NASAL | Status: DC
  Administered 2015-12-03 – 2015-12-08 (×6): 1 via NASAL
  Filled 2015-12-03: qty 30

## 2015-12-03 MED ORDER — DIAZEPAM 2 MG PO TABS
1.0000 mg | ORAL_TABLET | Freq: Every day | ORAL | Status: DC
  Administered 2015-12-03 – 2015-12-08 (×6): 1 mg via ORAL
  Filled 2015-12-03 (×6): qty 1

## 2015-12-03 MED ORDER — ACETAMINOPHEN 325 MG PO TABS
650.0000 mg | ORAL_TABLET | Freq: Four times a day (QID) | ORAL | Status: DC | PRN
  Administered 2015-12-04 – 2015-12-09 (×9): 650 mg via ORAL
  Filled 2015-12-03 (×10): qty 2

## 2015-12-03 MED ORDER — ACETAMINOPHEN 650 MG RE SUPP: 650.0000 mg | Freq: Four times a day (QID) | RECTAL | Status: DC | PRN

## 2015-12-03 MED ORDER — ATORVASTATIN CALCIUM 10 MG PO TABS
10.0000 mg | ORAL_TABLET | Freq: Every day | ORAL | Status: DC
  Administered 2015-12-03 – 2015-12-08 (×6): 10 mg via ORAL
  Filled 2015-12-03 (×6): qty 1

## 2015-12-03 MED ORDER — DUTASTERIDE 0.5 MG PO CAPS
0.5000 mg | ORAL_CAPSULE | Freq: Every day | ORAL | Status: DC
  Administered 2015-12-03 – 2015-12-09 (×7): 0.5 mg via ORAL
  Filled 2015-12-03 (×7): qty 1

## 2015-12-03 MED ORDER — DM-GUAIFENESIN ER 30-600 MG PO TB12: 1.0000 | ORAL_TABLET | Freq: Two times a day (BID) | ORAL | Status: DC | PRN

## 2015-12-03 MED ORDER — FUROSEMIDE 10 MG/ML IJ SOLN
40.0000 mg | Freq: Once | INTRAMUSCULAR | Status: AC
  Administered 2015-12-03: 40 mg via INTRAVENOUS
  Filled 2015-12-03: qty 4

## 2015-12-03 MED ORDER — TRAMADOL HCL 50 MG PO TABS
25.0000 mg | ORAL_TABLET | Freq: Three times a day (TID) | ORAL | Status: DC | PRN
  Administered 2015-12-05 – 2015-12-08 (×2): 50 mg via ORAL
  Filled 2015-12-03 (×2): qty 1

## 2015-12-03 MED ORDER — DICLOFENAC SODIUM 1 % TD GEL
2.0000 g | Freq: Every day | TRANSDERMAL | Status: DC | PRN
  Administered 2015-12-08 – 2015-12-09 (×2): 2 g via TOPICAL
  Filled 2015-12-03: qty 100

## 2015-12-03 MED ORDER — POTASSIUM CHLORIDE CRYS ER 10 MEQ PO TBCR
20.0000 meq | EXTENDED_RELEASE_TABLET | Freq: Every day | ORAL | Status: DC
  Administered 2015-12-03 – 2015-12-09 (×7): 20 meq via ORAL
  Filled 2015-12-03 (×11): qty 2

## 2015-12-03 MED ORDER — FUROSEMIDE 10 MG/ML IJ SOLN: 40.0000 mg | Freq: Two times a day (BID) | INTRAMUSCULAR | Status: DC

## 2015-12-03 MED ORDER — ONDANSETRON HCL 4 MG/2ML IJ SOLN: 4.0000 mg | Freq: Four times a day (QID) | INTRAMUSCULAR | Status: DC | PRN

## 2015-12-03 MED ORDER — GLUCOSAMINE-CHONDROITIN 500-400 MG PO TABS: 1.0000 | ORAL_TABLET | Freq: Two times a day (BID) | ORAL | Status: DC

## 2015-12-03 MED ORDER — SODIUM CHLORIDE 0.9% FLUSH
3.0000 mL | Freq: Two times a day (BID) | INTRAVENOUS | Status: DC
  Administered 2015-12-03 – 2015-12-09 (×13): 3 mL via INTRAVENOUS

## 2015-12-03 MED ORDER — LEVOTHYROXINE SODIUM 50 MCG PO TABS
50.0000 ug | ORAL_TABLET | Freq: Every day | ORAL | Status: DC
  Administered 2015-12-04 – 2015-12-09 (×6): 50 ug via ORAL
  Filled 2015-12-03 (×6): qty 1

## 2015-12-03 NOTE — ED Provider Notes (Signed)
By signing my name below, I, Victor Green, attest that this documentation has been prepared under the direction and in the presence of [place], DO. Electronically Signed: [place] Green, ED Scribe. 12/03/2015. 2:37 AM.  TIME SEEN: 12:50 AM  CHIEF COMPLAINT: abdominal and LE swelling  HPI: Victor Green is a 82 y.o. male with history of HTN, HLD, CAD, and anasarca who presents to the Emergency Department complaining bilateral lower extremity and abdominal swelling with onset 3 days ago. Caregivers state that the pt has gained 11 pounds over the last 3 days. His dry weight is 210 pounds and he was 221 pounds today. Today, his doctor increased his Lasix from 20 mg BID to 40 mg in the morning and 20 mg in the evening, but he is still not urinating much at home. Pt denies chest pain and SOB. He is on 2 L of oxygen at home.   ROS: See HPI Constitutional: no fever  Eyes: no drainage  ENT: no runny nose   Cardiovascular:  no chest pain  Resp: no SOB  GI: no vomiting GU: no dysuria Integumentary: no rash  Allergy: no hives  Musculoskeletal: leg swelling  Neurological: no slurred speech ROS otherwise negative  PAST MEDICAL HISTORY/PAST SURGICAL HISTORY:  Past Medical History  Diagnosis Date  . Hypertension   . CAD (coronary artery disease) complete w/u in 2009    extensive coronary calcification by EBCT, normal myoview, echo with EF 70% mild AI  . Hyperlipidemia   . Aortic insufficiency     echo July 2011 EF 60%  . 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 History of GI bleed 2003    due to pyloric-positive ulcer   . Diverticulosis     with remote history of diverticulitis  . Plantar fasciitis     chronic  . Allergic rhinitis     responding well to desensitation injections  . Glaucoma   . Osteoarthritis     both knees  . Asthma     MEDICATIONS:  Prior to Admission medications   Medication Sig Start Date End Date Taking? Authorizing Provider   acetaminophen (TYLENOL) 325 MG tablet Take 325 mg by mouth every morning.     Historical Provider, MD  atorvastatin (LIPITOR) 10 MG tablet Take 1 tablet by mouth  daily Patient taking differently: Take 0.5  tablet by mouth daily at bedtime 07/09/15   [name], MD  azelastine (ASTELIN) 0.1 % nasal spray Place 1 spray into both nostrils 2 (two) times daily. Use in each nostril as directed Patient taking differently: Place 1 spray into both nostrils at bedtime. Use in each nostril as directed 08/20/14   [name], MD  B Complex-Biotin-FA (B-COMPLEX PO) Take 1 tablet by mouth daily.     Historical Provider, MD  brimonidine (ALPHAGAN) 0.15 % ophthalmic solution Place 1 drop into the left eye 2 (two) times daily.     Historical Provider, MD  carvedilol (COREG) 6.25 MG tablet Take 1 tablet (6.25 mg total) by mouth 2 (two) times daily with a meal. 09/22/15   Debbe Odea, MD  dextromethorphan-guaiFENesin (MUCINEX DM) 30-600 MG 12hr tablet Take 1 tablet by mouth 2 (two) times daily as needed for cough. 09/22/15   Debbe Odea, MD  diazepam (VALIUM) 2 MG tablet Take 0.5 tablets (1 mg total) by mouth at bedtime. 10/11/15   [name], MD  diclofenac sodium (VOLTAREN) 1 % GEL Apply 3 times per day Patient  taking differently: Apply 2 g topically daily as needed (for right knee). Apply 3 times per day 08/16/15   [name], MD  dorzolamide (TRUSOPT) 2 % ophthalmic solution Place 1 drop into the left eye 2 (two) times daily.     Historical Provider, MD  dutasteride (AVODART) 0.5 MG capsule Take 1 capsule by mouth  daily Patient taking differently: Take 1 capsule by mouth  daily in evening 07/03/15   [name], MD  EPIPEN 2-PAK 0.3 MG/0.3ML SOAJ injection Inject 0.75ml (0.3mg ) into  the muscle once as directed 12/26/14   [name], MD  ferrous sulfate 325 (65 FE) MG tablet Take 325 mg by mouth daily with breakfast.    Historical Provider, MD  fexofenadine (ALLEGRA) 180 MG tablet Take 90 mg by mouth every evening.  1/2 tablet once a day    Historical Provider, MD  fluticasone (FLONASE) 50 MCG/ACT nasal spray Use 2 sprays in each  nostril twice daily 08/21/15   [name], MD  furosemide (LASIX) 40 MG tablet Take 1 tablet daily, if there is swelling take 2 tablets daily until swelling goes down. 09/18/15   [name], MD  glucosamine-chondroitin 500-400 MG tablet Take 1 tablet by mouth 2 (two) times daily.     Historical Provider, MD  guaiFENesin (MUCINEX) 600 MG 12 hr tablet Take 1 tablet (600 mg total) by mouth 2 (two) times daily as needed for to loosen phlegm. 11/04/15   Olivia Canter Sam, PA-C  hydrALAZINE (APRESOLINE) 10 MG tablet Take 1 tablet (10 mg total) by mouth every 8 (eight) hours. 09/22/15   Debbe Odea, MD  levothyroxine (SYNTHROID, LEVOTHROID) 50 MCG tablet Take 1 tablet by mouth  daily Patient taking differently: Take 0.5 tablet (63mcg) by mouth  daily 07/01/15   [name], MD  metolazone (ZAROXOLYN) 2.5 MG tablet Take 2.5 mg by mouth daily.    Historical Provider, MD  Multiple Vitamin (MULTIVITAMIN) capsule Take 1 capsule by mouth daily.      Historical Provider, MD  pantoprazole (PROTONIX) 40 MG tablet Take 1 tablet by mouth  daily 09/25/15   [name], MD  potassium chloride (K-DUR) 10 MEQ tablet Take 2 tablets (20 mEq total) by mouth daily. 09/30/15   [name], MD  Probiotic Product (ALIGN) 4 MG CAPS Take 4 mg by mouth every evening.    Historical Provider, MD  traMADol (ULTRAM) 50 MG tablet Half to 1 tablet with food up to 3 times a day for pain Patient taking differently: Take 25-50 mg by mouth 3 (three) times daily as needed for moderate pain. Half to 1 tablet with food up to 3 times a day for pain 09/16/15   [name], MD    ALLERGIES:  Allergies  Allergen Reactions  . Other Anaphylaxis    Horse serum  . Tetanus Toxoids Other (See Comments)    SOCIAL HISTORY:  Social History  Substance Use Topics  . Smoking status: Never Smoker   . Smokeless tobacco: Not on file  . Alcohol  Use: No    FAMILY HISTORY: Family History  Problem Relation Age of Onset  . Thrombosis Father     mesenteric artery thrombosis after prostate surgery  . Colon cancer Mother     EXAM: BP 148/64 mmHg  Pulse 56  Resp 15  SpO2 96% Temp 97.5 F oral; weight is 217.5 lbs CONSTITUTIONAL: Alert and oriented and responds appropriately to questions. Elderly and chronically ill appearing but in NAD HEAD: Normocephalic EYES: Conjunctivae clear, PERRL  ENT: normal nose; no rhinorrhea; moist mucous membranes; pharynx without lesions noted NECK: Supple, no meningismus, no LAD  CARD: RRR; S1 and S2 appreciated; no murmurs, no clicks, no rubs, no gallops RESP: Normal chest excursion without splinting or tachypnea; breath sounds clear and equal bilaterally; no wheezes, no rhonchi, no rales, no hypoxia or respiratory distress, speaking full sentences ABD/GI: Normal bowel sounds; mild swelling in the abdomen but no significant distention, fluid wave or tympany; soft, non-tender, no rebound, no guarding, no peritoneal signs BACK:  The back appears normal and is non-tender to palpation, there is no CVA tenderness EXT: Normal ROM in all joints; non-tender to palpation; mild pitting edema in BLEs; normal capillary refill; no cyanosis, no calf tenderness or swelling   SKIN: Normal color for age and race; warm NEURO: Moves all extremities equally, sensation to light touch intact diffusely, cranial nerves II through XII intact PSYCH: The patient's mood and manner are appropriate. Grooming and personal hygiene are appropriate.  MEDICAL DECISION MAKING: Patient here with signs of volume overload. He is not any significant distress and seemed dynamically stable. Lungs are clear to auscultation. He does have peripheral edema and edema in his abdomen. We'll obtain labs, chest x-ray. EKG shows no ischemic changes. We'll give IV Lasix. Family states that he does not have a cardiologist. He is followed with Jeff.  ED PROGRESS: Patient's labs appeared to be near their baseline. BNP is only mildly elevated at 240. Troponin is negative. Chest x-ray shows mild vascular congestion. Patient has had more urinating since giving IV Lasix. Family would like admission for IV diuresis. We'll discuss with hospitalist.  2:35 AM-Consult complete with Dr. Hal [facility]). Patient case explained and discussed. Agrees to admit patient for further evaluation and treatment, inpatient telemetry. I will place holding orders per his request. Care transferred to hospitalist service for further management. Patient and family updated with plan.    EKG Interpretation  Date/Time:  Tuesday December 03 2015 01:09:54 EST Ventricular Rate:  62 PR Interval:  287 QRS Duration: 151 QT Interval:  447 QTC Calculation: [id number] R Axis:   -50 Text Interpretation:  Sinus rhythm Prolonged PR interval Right bundle branch block Probable inferior infarct, age indeterminate No significant change since last tracing Confirmed by WARD,  DO, KRISTEN ([id number]) on 12/03/2015 1:14:54 AM        I personally performed the services described in this documentation, which was scribed in my presence. The recorded information has been reviewed and is accurate.     [place], DO 12/03/15 [phone]

## 2015-12-03 NOTE — H&P (Signed)
[facility] History and Physical  Victor Green [id number] DOB: 16-Jun-1920 DOA: 12/03/2015  Referring physician: Dr. Leonides Schanz. PCP: [name], MD  Specialists: Cardiology.  Chief Complaint: Increasing lower extremity edema.  HPI: Victor Green is a 82 y.o. male with history of diastolic CHF last EF measured in November 2016 was 55-60% with grade 1 diastolic dysfunction presents with the ER because of increasing lower extremity edema. As per patient's caregiver patient's Lasix dose was decreased by half last week. Patient's blood pressure medicines also was decreased. Over the last few days patient was found to have increasing lower extremity edema extending up to the abdomen. Patient denies any chest pain or shortness of breath. On exam patient has significant lower extremity edema extending up all the way to the abdomen. Chest x-ray shows mild congestion. Patient will be admitted for decompensated CHF.   Review of Systems: As presented in the history of presenting illness, rest negative.  Past Medical History  Diagnosis Date  . Hypertension   . CAD (coronary artery disease) complete w/u in 2009    extensive coronary calcification by EBCT, normal myoview, echo with EF 70% mild AI  . Hyperlipidemia   . Aortic insufficiency     echo July 2011 EF 60%  . 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 History of GI bleed 2003    due to pyloric-positive ulcer   . Diverticulosis     with remote history of diverticulitis  . Plantar fasciitis     chronic  . Allergic rhinitis     responding well to desensitation injections  . Glaucoma   . Osteoarthritis     both knees  . Asthma   . CHF (congestive heart failure) [facility])    Past Surgical History  Procedure Laterality Date  . Tonsillectomy    . Hemorrhoid surgery  1996  . Hernia repair  1996    right  . Lumbar laminectomy  2003  . Cataract extraction      left eye   Social History:  reports that he has never  smoked. He does not have any smokeless tobacco history on file. He reports that he does not drink alcohol or use illicit drugs. Where does patient live at home. Can patient participate in ADLs? Yes.  Allergies  Allergen Reactions  . Other Anaphylaxis    Horse serum  . Tetanus Toxoids Other (See Comments)    Family History:  Family History  Problem Relation Age of Onset  . Thrombosis Father     mesenteric artery thrombosis after prostate surgery  . Colon cancer Mother       Prior to Admission medications   Medication Sig Start Date End Date Taking? Authorizing Provider  acetaminophen (TYLENOL) 325 MG tablet Take 325 mg by mouth every morning.    Yes Historical Provider, MD  atorvastatin (LIPITOR) 10 MG tablet Take 1 tablet by mouth  daily Patient taking differently: Take 0.5  tablet by mouth daily at bedtime 07/09/15  Yes [name], MD  azelastine (ASTELIN) 0.1 % nasal spray Place 1 spray into both nostrils 2 (two) times daily. Use in each nostril as directed Patient taking differently: Place 1 spray into both nostrils at bedtime. Use in each nostril as directed 08/20/14  Yes [name], MD  B Complex-Biotin-FA (B-COMPLEX PO) Take 1 tablet by mouth daily.    Yes Historical Provider, MD  brimonidine (ALPHAGAN) 0.15 % ophthalmic solution Place 1 drop into the left eye  2 (two) times daily.    Yes Historical Provider, MD  carvedilol (COREG) 6.25 MG tablet Take 1 tablet (6.25 mg total) by mouth 2 (two) times daily with a meal. 09/22/15  Yes Debbe Odea, MD  cloNIDine (CATAPRES) 0.1 MG tablet Take 0.1 mg by mouth 3 (three) times daily as needed (if blood pressure is over 180).   Yes Historical Provider, MD  dextromethorphan-guaiFENesin (MUCINEX DM) 30-600 MG 12hr tablet Take 1 tablet by mouth 2 (two) times daily as needed for cough. 09/22/15  Yes Debbe Odea, MD  diazepam (VALIUM) 2 MG tablet Take 0.5 tablets (1 mg total) by mouth at bedtime. 10/11/15  Yes [name], MD  diclofenac sodium  (VOLTAREN) 1 % GEL Apply 3 times per day Patient taking differently: Apply 2 g topically daily as needed (for right knee). Apply 3 times per day 08/16/15  Yes [name], MD  dorzolamide (TRUSOPT) 2 % ophthalmic solution Place 1 drop into the left eye 2 (two) times daily.    Yes Historical Provider, MD  dutasteride (AVODART) 0.5 MG capsule Take 1 capsule by mouth  daily Patient taking differently: Take 1 capsule by mouth  daily in evening 07/03/15  Yes [name], MD  EPIPEN 2-PAK 0.3 MG/0.3ML SOAJ injection Inject 0.68ml (0.3mg ) into  the muscle once as directed 12/26/14  Yes [name], MD  ferrous sulfate 325 (65 FE) MG tablet Take 325 mg by mouth daily with breakfast.   Yes Historical Provider, MD  fexofenadine (ALLEGRA) 180 MG tablet Take 90 mg by mouth every evening. 1/2 tablet once a day   Yes Historical Provider, MD  fluticasone (FLONASE) 50 MCG/ACT nasal spray Use 2 sprays in each  nostril twice daily 08/21/15  Yes [name], MD  furosemide (LASIX) 40 MG tablet Take 1 tablet daily, if there is swelling take 2 tablets daily until swelling goes down. Patient taking differently: Take 20-40 mg by mouth 2 (two) times daily. Take 1 tablet every morning and take 1/2 tablet at 4 pm . 09/18/15  Yes [name], MD  glucosamine-chondroitin 500-400 MG tablet Take 1 tablet by mouth 2 (two) times daily.    Yes Historical Provider, MD  guaiFENesin (MUCINEX) 600 MG 12 hr tablet Take 1 tablet (600 mg total) by mouth 2 (two) times daily as needed for to loosen phlegm. 11/04/15  Yes Olivia Canter Sam, PA-C  hydrALAZINE (APRESOLINE) 10 MG tablet Take 1 tablet (10 mg total) by mouth every 8 (eight) hours. 09/22/15  Yes Debbe Odea, MD  levothyroxine (SYNTHROID, LEVOTHROID) 50 MCG tablet Take 1 tablet by mouth  daily Patient taking differently: Take 0.5 tablet (83mcg) by mouth  daily 07/01/15  Yes [name], MD  metolazone (ZAROXOLYN) 2.5 MG tablet Take 2.5 mg by mouth daily.   Yes Historical Provider, MD  Multiple  Vitamin (MULTIVITAMIN WITH MINERALS) TABS tablet Take 1 tablet by mouth daily.   Yes Historical Provider, MD  pantoprazole (PROTONIX) 40 MG tablet Take 1 tablet by mouth  daily 09/25/15  Yes [name], MD  potassium chloride (K-DUR) 10 MEQ tablet Take 2 tablets (20 mEq total) by mouth daily. 09/30/15  Yes [name], MD  Probiotic Product (ALIGN) 4 MG CAPS Take 4 mg by mouth every evening.   Yes Historical Provider, MD  traMADol (ULTRAM) 50 MG tablet Half to 1 tablet with food up to 3 times a day for pain Patient taking differently: Take 25-50 mg by mouth 3 (three) times daily as needed for moderate pain. Half  to 1 tablet with food up to 3 times a day for pain 09/16/15  Yes [name], MD    Physical Exam: Filed Vitals:   12/03/15 0245 12/03/15 0300 12/03/15 0315 12/03/15 0354  BP: 146/67 156/69 127/63 166/94  Pulse: 55 57 55 55  Temp:    98 F (36.7 C)  TempSrc:      Resp: 17 15 16    Height:    5\' 10"  (1.778 m)  Weight:    97.659 kg (215 lb 4.8 oz)  SpO2: 97% 97% 96% 95%     General:  Moderately built and nourished.  Eyes: Anicteric no pallor.  ENT: No discharge from the ears eyes nose and mouth  Neck: No JVD appreciated. No mass felt.  Cardiovascular: S1-S2 heard.  Respiratory: No rhonchi or crepitations.  Abdomen: Distended soft nontender bowel sounds present.  Skin: Chronic skin changes.  Musculoskeletal: Bilateral lower extremity edema.  Psychiatric: Appears normal.  Neurologic: Alert awake oriented to time place and person. Moves all extremities.  Labs on Admission:  Basic Metabolic Panel:  Recent Labs Lab 12/03/15 0130  NA 141  K 4.2  CL 98*  CO2 31  GLUCOSE 124*  BUN 34*  CREATININE 1.21  CALCIUM 8.9   Liver Function Tests: No results for input(s): AST, ALT, ALKPHOS, BILITOT, PROT, ALBUMIN in the last 168 hours. No results for input(s): LIPASE, AMYLASE in the last 168 hours. No results for input(s): AMMONIA in the last 168 hours. CBC:  Recent  Labs Lab 12/03/15 0130  WBC 5.2  NEUTROABS 2.1  HGB 8.7*  HCT 28.5*  MCV 104.8*  PLT 53*   Cardiac Enzymes: No results for input(s): CKTOTAL, CKMB, CKMBINDEX, TROPONINI in the last 168 hours.  BNP (last 3 results)  Recent Labs  09/20/15 1938 11/04/15 1607 12/03/15 0130  BNP 443.0* 162.6* 240.6*    ProBNP (last 3 results) No results for input(s): PROBNP in the last 8760 hours.  CBG: No results for input(s): GLUCAP in the last 168 hours.  Radiological Exams on Admission: Dg Chest 2 View  12/03/2015  CLINICAL DATA:  Shortness of breath tonight.  Volume overload. EXAM: CHEST  2 VIEW COMPARISON:  11/04/2015, 09/20/2015 FINDINGS: Cardiomegaly and tortuous thoracic aorta, unchanged from prior. Again seen eventration of right hemidiaphragm. Increased bandlike opacity at the right lung base. Mild vascular congestion. No pleural effusion or pneumothorax. Bones are under mineralized. IMPRESSION: Mild vascular congestion.  Increased right basilar atelectasis. Electronically Signed   By: Jeb Levering M.D.   On: 12/03/2015 01:48    EKG: Independently reviewed. Normal sinus rhythm with prolonged PR interval.  Assessment/Plan Principal Problem:   Acute on chronic diastolic CHF (congestive heart failure), NYHA class 1 (HCC) Active Problems:   Essential hypertension   Thrombocytopenia, unspecified (HCC)   CHF (congestive heart failure) ([place])   1. Acute on chronic diastolic CHF last EF measured in November 2016 was 55-60% with grade 1 diastolic dysfunction - patient has been placed on Lasix 40 mg IV twice daily. Closely follow intake output metabolic panel and daily weights. 2. Hypertension - patient's hydralazine was recently discontinued as per the patient's caregiver. Closely monitor her blood pressure trends continue present medications. 3. Chronic anemia and thrombocytopenia - follow CBC. 4. History of BPH on Avodart.  Patient states he is a full code but he wants to reconfirm  it with his son in the a.m.  DVT Prophylaxis Lovenox.  Code Status: Full code.  Family Communication: Discussed with patient.  Disposition  Plan: Admit to inpatient.    Corissa Oguinn N. [facility] Pager [phone].  If 7PM-7AM, please contact night-coverage [email or web address] Password [facility] 12/03/2015, 6:13 AM

## 2015-12-03 NOTE — Consult Note (Signed)
CARDIOLOGY CONSULT NOTE   Patient ID: [name] MRN: [id number] DOB/AGE: 06-Jul-1920 82 y.o.  Admit date: 12/03/2015  Primary Physician   [name], MD Primary Cardiologist   Dr. Haroldine Laws Reason for Consultation   Edema  Requesting Physician Family requests  HPI: Victor Green is a 82 y.o. male with a history of chronic diastolic CHF (Last echo 123456 showed grade 1 diastolic dysfunction and EF 55-60%.), CAD (extensive of coronary calcification by EBCT 2009, Myoview Normal 2009), HTN, HLD, and anasarca who presented to [facility] ED this morning with increasing lower extremity edema. Per caregivers, the edema has gotten progressively worse over the past 3 days and patient has gained 11 pounds. His dry weight is 210, weight in ED was 221. Denies CP and SOB in ED. He is on 2 L of 02 at home.   In ED, patient's labs appeared to be near their baseline. BNP mildly elevated at 240. Troponin is negative. CXR with mild vascular congestion. EKG with sinus rhythm, prolonged PR, RBBB (no acute changes from previous EKGs). Patient has had more urinating since giving IV Lasix. Admitted for decompensated CHF. Cardiology was consulted per family's request.   Patient used to follow with Dr.Bensimhon but has not seen him since 08/27/11. His last admission for edema was 09/2015. Dr. Acie Fredrickson was consulted during this visit and per Dr. Elmarie Shiley note he did not think the edema was due to cardiac etiology. His LV function was normal and echo was stable. Dr. Acie Fredrickson recommended changing metoprolol to coreg and keeping on hydralazine. Patient was apparently tolerating medication regimen until he saw his internal medicine doctor on 11/22/15. During this visit, the patients Lasix dosing was altered and hydralazine was discontinued. Clonidine was added for SBP over 180.  According to caregivers, patient has gone downhill since this med change. The caregivers are adament that patient see Dr. Haroldine Laws for further  management of diastolic CHF.   Currently, patient states he still feels volume overloaded, especially in his arms. He denies CP or SOB. His caregiver's are performing strict I&Os and his net output is 1,26ml today.    Past Medical History  Diagnosis Date  . Hypertension   . CAD (coronary artery disease) complete w/u in 2009    extensive coronary calcification by EBCT, normal myoview, echo with EF 70% mild AI  . Hyperlipidemia   . Aortic insufficiency     echo July 2011 EF 60%  . 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 History of GI bleed 2003    due to pyloric-positive ulcer   . Diverticulosis     with remote history of diverticulitis  . Plantar fasciitis     chronic  . Allergic rhinitis     responding well to desensitation injections  . Glaucoma   . Osteoarthritis     both knees  . Asthma   . CHF (congestive heart failure) [facility])      Past Surgical History  Procedure Laterality Date  . Tonsillectomy    . Hemorrhoid surgery  1996  . Hernia repair  1996    right  . Lumbar laminectomy  2003  . Cataract extraction      left eye    Allergies  Allergen Reactions  . Other Anaphylaxis    Horse serum  . Tetanus Toxoids Other (See Comments)    I have reviewed the patient's current medications . atorvastatin  10 mg Oral q1800  . azelastine  1 spray  Each Nare QHS  . brimonidine  1 drop Left Eye BID  . carvedilol  6.25 mg Oral BID WC  . diazepam  1 mg Oral QHS  . dorzolamide  1 drop Left Eye BID  . dutasteride  0.5 mg Oral Daily  . ferrous sulfate  325 mg Oral Q breakfast  . fluticasone  2 spray Each Nare Daily  . furosemide  40 mg Intravenous BID  . hydrALAZINE  10 mg Oral 3 times per day  . levothyroxine  50 mcg Oral QAC breakfast  . loratadine  10 mg Oral Daily  . pantoprazole  40 mg Oral Daily  . potassium chloride  20 mEq Oral Daily  . sodium chloride flush  3 mL Intravenous Q12H     acetaminophen **OR** acetaminophen,  dextromethorphan-guaiFENesin, diclofenac sodium, ondansetron **OR** ondansetron (ZOFRAN) IV, traMADol  Prior to Admission medications   Medication Sig Start Date End Date Taking? Authorizing Provider  acetaminophen (TYLENOL) 325 MG tablet Take 325 mg by mouth every morning.    Yes Historical Provider, MD  atorvastatin (LIPITOR) 10 MG tablet Take 1 tablet by mouth  daily Patient taking differently: Take 0.5  tablet by mouth daily at bedtime 07/09/15  Yes [name], MD  azelastine (ASTELIN) 0.1 % nasal spray Place 1 spray into both nostrils 2 (two) times daily. Use in each nostril as directed Patient taking differently: Place 1 spray into both nostrils at bedtime. Use in each nostril as directed 08/20/14  Yes [name], MD  B Complex-Biotin-FA (B-COMPLEX PO) Take 1 tablet by mouth daily.    Yes Historical Provider, MD  brimonidine (ALPHAGAN) 0.15 % ophthalmic solution Place 1 drop into the left eye 2 (two) times daily.    Yes Historical Provider, MD  carvedilol (COREG) 6.25 MG tablet Take 1 tablet (6.25 mg total) by mouth 2 (two) times daily with a meal. 09/22/15  Yes Debbe Odea, MD  cloNIDine (CATAPRES) 0.1 MG tablet Take 0.1 mg by mouth 3 (three) times daily as needed (if blood pressure is over 180).   Yes Historical Provider, MD  dextromethorphan-guaiFENesin (MUCINEX DM) 30-600 MG 12hr tablet Take 1 tablet by mouth 2 (two) times daily as needed for cough. 09/22/15  Yes Debbe Odea, MD  diazepam (VALIUM) 2 MG tablet Take 0.5 tablets (1 mg total) by mouth at bedtime. 10/11/15  Yes [name], MD  diclofenac sodium (VOLTAREN) 1 % GEL Apply 3 times per day Patient taking differently: Apply 2 g topically daily as needed (for right knee). Apply 3 times per day 08/16/15  Yes [name], MD  dorzolamide (TRUSOPT) 2 % ophthalmic solution Place 1 drop into the left eye 2 (two) times daily.    Yes Historical Provider, MD  dutasteride (AVODART) 0.5 MG capsule Take 1 capsule by mouth  daily Patient taking  differently: Take 1 capsule by mouth  daily in evening 07/03/15  Yes [name], MD  EPIPEN 2-PAK 0.3 MG/0.3ML SOAJ injection Inject 0.72ml (0.3mg ) into  the muscle once as directed 12/26/14  Yes [name], MD  ferrous sulfate 325 (65 FE) MG tablet Take 325 mg by mouth daily with breakfast.   Yes Historical Provider, MD  fexofenadine (ALLEGRA) 180 MG tablet Take 90 mg by mouth every evening. 1/2 tablet once a day   Yes Historical Provider, MD  fluticasone (FLONASE) 50 MCG/ACT nasal spray Use 2 sprays in each  nostril twice daily 08/21/15  Yes [name], MD  furosemide (LASIX) 40 MG tablet Take 1 tablet daily,  if there is swelling take 2 tablets daily until swelling goes down. Patient taking differently: Take 20-40 mg by mouth 2 (two) times daily. Take 1 tablet every morning and take 1/2 tablet at 4 pm . 09/18/15  Yes [name], MD  glucosamine-chondroitin 500-400 MG tablet Take 1 tablet by mouth 2 (two) times daily.    Yes Historical Provider, MD  guaiFENesin (MUCINEX) 600 MG 12 hr tablet Take 1 tablet (600 mg total) by mouth 2 (two) times daily as needed for to loosen phlegm. 11/04/15  Yes Olivia Canter Sam, PA-C  hydrALAZINE (APRESOLINE) 10 MG tablet Take 1 tablet (10 mg total) by mouth every 8 (eight) hours. 09/22/15  Yes Debbe Odea, MD  levothyroxine (SYNTHROID, LEVOTHROID) 50 MCG tablet Take 1 tablet by mouth  daily Patient taking differently: Take 0.5 tablet (75mcg) by mouth  daily 07/01/15  Yes [name], MD  metolazone (ZAROXOLYN) 2.5 MG tablet Take 2.5 mg by mouth daily.   Yes Historical Provider, MD  Multiple Vitamin (MULTIVITAMIN WITH MINERALS) TABS tablet Take 1 tablet by mouth daily.   Yes Historical Provider, MD  pantoprazole (PROTONIX) 40 MG tablet Take 1 tablet by mouth  daily 09/25/15  Yes [name], MD  potassium chloride (K-DUR) 10 MEQ tablet Take 2 tablets (20 mEq total) by mouth daily. 09/30/15  Yes [name], MD  Probiotic Product (ALIGN) 4 MG CAPS Take 4 mg by mouth every  evening.   Yes Historical Provider, MD  traMADol (ULTRAM) 50 MG tablet Half to 1 tablet with food up to 3 times a day for pain Patient taking differently: Take 25-50 mg by mouth 3 (three) times daily as needed for moderate pain. Half to 1 tablet with food up to 3 times a day for pain 09/16/15  Yes [name], MD     Social History   Social History  . Marital Status: Married    Spouse Name: N/A  . Number of Children: N/A  . Years of Education: N/A   Occupational History  . Not on file.   Social History Main Topics  . Smoking status: Never Smoker   . Smokeless tobacco: Not on file  . Alcohol Use: No  . Drug Use: No  . Sexual Activity: Not on file   Other Topics Concern  . Not on file   Social History Narrative    Family Status  Relation Status Death Age  . Father Deceased 69  . Mother Deceased 30   Family History  Problem Relation Age of Onset  . Thrombosis Father     mesenteric artery thrombosis after prostate surgery  . Colon cancer Mother       ROS:  Full 14 point review of systems complete and found to be negative unless listed above.  Physical Exam: Blood pressure 145/56, pulse 63, temperature 97.9 F (36.6 C), temperature source Oral, resp. rate 18, height 5\' 10"  (1.778 m), weight 215 lb 4.8 oz (97.659 kg), SpO2 90 %.  General: Well developed, well nourished, male in no acute distress Head: Eyes PERRLA, No xanthomas. Normocephalic and atraumatic, oropharynx without edema or exudate.  Lungs: Resp regular and unlabored, CTA. Heart: RRR no s3, s4, or murmurs..   Neck: No carotid bruits. No lymphadenopathy.  No JVD noted.  Abdomen: Bowel sounds present, abdomen soft and non-tender without masses or hernias noted. Msk:  No spine or cva tenderness. No weakness, no joint deformities or effusions. Extremities: No clubbing, cyanosis. Bilateral 2+ edema in lower extremities. DP/PT/Radials 2+ and  equal bilaterally. Neuro: Alert and oriented X 3. No focal deficits  noted. Psych:  Good affect, responds appropriately Skin: No acute rashes or lesions noted.  Labs:   Lab Results  Component Value Date   WBC 5.1 12/03/2015   HGB 8.3* 12/03/2015   HCT 27.0* 12/03/2015   MCV 103.8* 12/03/2015   PLT 57* 12/03/2015   No results for input(s): INR in the last 72 hours.  Recent Labs Lab 12/03/15 1052  NA 141  K 3.5  CL 103  CO2 32  BUN 33*  CREATININE 1.18  CALCIUM 8.8*  PROT 6.1*  BILITOT 0.3  ALKPHOS 55  ALT 18  AST 24  GLUCOSE 102*  ALBUMIN 3.3*   MAGNESIUM  Date Value Ref Range Status  08/09/2015 2.1 1.7 - 2.4 mg/dL Final    Recent Labs  12/03/15 1052  TROPONINI 0.03    Recent Labs  12/03/15 0137  TROPIPOC 0.01   No results found for: PROBNP Lab Results  Component Value Date   CHOL 111 07/17/2015   HDL 51.70 07/17/2015   LDLCALC 47 07/17/2015   TRIG 65.0 07/17/2015   No results found for: DDIMER No results found for: LIPASE, AMYLASE TSH  Date/Time Value Ref Range Status  08/10/2015 05:36 AM 2.571 0.350 - 4.500 uIU/mL Final  07/17/2015 01:43 PM 6.19* 0.35 - 4.50 uIU/mL Final   VITAMIN B-12  Date/Time Value Ref Range Status  07/17/2014 01:55 PM 925* 211 - 911 pg/mL Final   FOLATE  Date/Time Value Ref Range Status  07/17/2014 01:55 PM 18.6 ng/mL Final    Comment:     Reference Ranges        Deficient:       0.4 - 3.3 ng/mL        Indeterminate:   3.4 - 5.4 ng/mL        Normal:              > 5.4 ng/mL    FERRITIN  Date/Time Value Ref Range Status  07/17/2014 01:55 PM 269 22 - 316 ng/ml Final  07/15/2010 01:46 PM 38 22 - 322 ng/mL Final   TIBC  Date/Time Value Ref Range Status  07/17/2014 01:55 PM 229 202 - 409 ug/dL Final  07/15/2010 01:46 PM 260 215 - 435 ug/dL Final   IRON  Date/Time Value Ref Range Status  09/02/2015 04:31 PM 87 42 - 165 ug/dL Final  07/17/2014 01:55 PM 108 42 - 163 ug/dL Final   RETIC %  Date/Time Value Ref Range Status  07/17/2014 01:55 PM 2.80* 0.80 - 1.80 % Final    Echo:   09/22/15  Study Conclusions - Left ventricle: The cavity size was normal. Wall thickness was normal. Systolic function was normal. The estimated ejection fraction was in the range of 55% to 60%. Wall motion was normal; there were no regional wall motion abnormalities. Doppler parameters are consistent with abnormal left ventricular relaxation (grade 1 diastolic dysfunction). - Aortic valve: There was no stenosis. There was trivial regurgitation. - Mitral valve: Mildly calcified annulus. There was trivial regurgitation. - Left atrium: The atrium was mildly dilated. - Right ventricle: The cavity size was normal. Systolic function was normal. - Pulmonary arteries: No complete TR doppler jet so unable to estimate PA systolic pressure. - Pericardium, extracardiac: A trivial pericardial effusion was identified posterior to the heart. - Impressions: IVC measured 2.2 cm with < 50% respirophasic variation, suggesting RA pressure 15 mmHg. Impressions: - IVC measured 2.2 cm with < 50% respirophasic  variation, suggesting RA pressure 15 mmHg.  ECG: 12/03/15 with sinus rhythm, prolonged PR, RBBB (no acute changes from previous EKGs)  Radiology:  Dg Chest 2 View  12/03/2015  CLINICAL DATA:  Shortness of breath tonight.  Volume overload. EXAM: CHEST  2 VIEW COMPARISON:  11/04/2015, 09/20/2015 FINDINGS: Cardiomegaly and tortuous thoracic aorta, unchanged from prior. Again seen eventration of right hemidiaphragm. Increased bandlike opacity at the right lung base. Mild vascular congestion. No pleural effusion or pneumothorax. Bones are under mineralized. IMPRESSION: Mild vascular congestion.  Increased right basilar atelectasis. Electronically Signed   By: Jeb Levering M.D.   On: 12/03/2015 01:48    ASSESSMENT AND PLAN:     1. Acute on chronic diastolic CHF: Has not followed with cardiologist since 2012.  Last EF measured in November 2016 was 55-60% with grade 1 diastolic  dysfunction.  2. Essential hypertension:   3. Thrombocytopenia:       Signed: Tenna Delaine, [place] 12/03/2015, 4:35 PM   Co-Sign MD

## 2015-12-03 NOTE — Progress Notes (Signed)
Patient arrived to the unit from the ED via stretcher. Patient alert, oriented and a max assist. Admission weight, vitals and assessment completed. Fall and safety plan reviewed with patient, his family bedside and private sitter. Patient currently resting comfortably awaiting MD orders. Call light within reach, bed alarm in use. Will continue to monitor. Blood pressure 127/63, pulse 55, temperature 98.5 F (36.9 C), temperature source Oral, resp. rate 16, weight 98.657 kg (217 lb 8 oz), SpO2 96 %.   Tresa Endo

## 2015-12-03 NOTE — Progress Notes (Signed)
Pt is in oxygen 2l/[place] now, this afternoon in RA pt's sat was 90% and after half an hour it dropped to 86, so restarted via [place], no any sign of SOB and distress, Caregivers are in bed side, cardiac consultation done, will continue to monitor

## 2015-12-03 NOTE — ED Notes (Signed)
Attempted to call report

## 2015-12-03 NOTE — ED Notes (Signed)
Fluid wt gain of 9 lbs since yesterday. Dr  Increased his lasix from 0.5mg  in am and 0.5 at 4 pm, to increased to 1.5mg  total. (Just increased yest afternoon). Still not voiding a lot. In home care states no increased urine output as of yet. Pitting edema in arms and legs. Abdominal distention with rigidity but no tenderness. Family wanted pt checked out.

## 2015-12-03 NOTE — Progress Notes (Signed)
[facility]  This is a 82 year old male admitted by my colleague this morning. He was sent to the hospital by his private caretaker for high blood pressure and pedal edema. He was admitted for congestive heart failure. She states that he was not short of breath and is not short of breath at this time.  Principal Problem:   Acute on chronic diastolic CHF (congestive heart failure), NYHA class 1  -Echo and 11/16 reveals an EF of 55-60% and grade 1 diastolic dysfunction -Continue Lasix-appears to be mild heart failure however, family is asking for a cardiology consult which I have requested -Recently, his PCP decreased his dose of Lasix as he was compared becoming hypotensive and dehydrated-  BUN and creatinine ratio is still elevated   Active Problems:  Severe pedal edema -Likely secondary to venous stasis and sitting in a chair and keeping legs in dependent position all day-his mobility is extremely limited-has been explained that legs need to be elevated at level of abdomen as much as possible -Already wears TED stockings    Essential hypertension -Family states hydralazine was recently stopped due to hypotension -however, as he is hypertensive here, it has been resumed -continue carvedilol and clonidine    Thrombocytopenia, unspecified - chronic- avoid anticoagulation for DVT- SCDs only    Debbe Odea, MD

## 2015-12-03 NOTE — Progress Notes (Signed)
Utilization review completed. Virgina Deakins, RN, BSN. 

## 2015-12-04 DIAGNOSIS — D696 Thrombocytopenia, unspecified: Secondary | ICD-10-CM

## 2015-12-04 DIAGNOSIS — I509 Heart failure, unspecified: Secondary | ICD-10-CM

## 2015-12-04 LAB — IRON AND TIBC
IRON: 81 ug/dL (ref 45–182)
Saturation Ratios: 32 % (ref 17.9–39.5)
TIBC: 252 ug/dL (ref 250–450)
UIBC: 171 ug/dL

## 2015-12-04 LAB — BASIC METABOLIC PANEL
ANION GAP: 12 (ref 5–15)
BUN: 32 mg/dL — ABNORMAL HIGH (ref 6–20)
CHLORIDE: 99 mmol/L — AB (ref 101–111)
CO2: 29 mmol/L (ref 22–32)
Calcium: 8.6 mg/dL — ABNORMAL LOW (ref 8.9–10.3)
Creatinine, Ser: 1.15 mg/dL (ref 0.61–1.24)
GFR calc non Af Amer: 53 mL/min — ABNORMAL LOW (ref >60)
Glucose, Bld: 91 mg/dL (ref 65–99)
POTASSIUM: 3.7 mmol/L (ref 3.5–5.1)
Sodium: 140 mmol/L (ref 135–145)

## 2015-12-04 LAB — CBC
HCT: 24.9 % — ABNORMAL LOW (ref 39.0–52.0)
HEMOGLOBIN: 7.9 g/dL — AB (ref 13.0–17.0)
MCH: 32.5 pg (ref 26.0–34.0)
MCHC: 31.7 g/dL (ref 30.0–36.0)
MCV: 102.5 fL — AB (ref 78.0–100.0)
Platelets: 54 10*3/uL — ABNORMAL LOW (ref 150–400)
RBC: 2.43 MIL/uL — AB (ref 4.22–5.81)
RDW: 18.4 % — ABNORMAL HIGH (ref 11.5–15.5)
WBC: 5.4 10*3/uL (ref 4.0–10.5)

## 2015-12-04 LAB — FERRITIN: Ferritin: 355 ng/mL — ABNORMAL HIGH (ref 24–336)

## 2015-12-04 LAB — RETICULOCYTES
RBC.: 2.55 MIL/uL — ABNORMAL LOW (ref 4.22–5.81)
RETIC COUNT ABSOLUTE: 86.7 10*3/uL (ref 19.0–186.0)
Retic Ct Pct: 3.4 % — ABNORMAL HIGH (ref 0.4–3.1)

## 2015-12-04 LAB — FOLATE: FOLATE: 48.6 ng/mL (ref >5.9)

## 2015-12-04 LAB — VITAMIN B12: Vitamin B-12: 1468 pg/mL — ABNORMAL HIGH (ref 180–914)

## 2015-12-04 MED ORDER — HYDRALAZINE HCL 25 MG PO TABS: 25.0000 mg | ORAL_TABLET | Freq: Three times a day (TID) | ORAL | Status: DC

## 2015-12-04 MED ORDER — FUROSEMIDE 10 MG/ML IJ SOLN
80.0000 mg | Freq: Two times a day (BID) | INTRAMUSCULAR | Status: DC
  Administered 2015-12-04 – 2015-12-06 (×4): 80 mg via INTRAVENOUS
  Filled 2015-12-04 (×4): qty 8

## 2015-12-04 MED ORDER — HYDRALAZINE HCL 25 MG PO TABS
12.5000 mg | ORAL_TABLET | Freq: Three times a day (TID) | ORAL | Status: DC
  Administered 2015-12-05 – 2015-12-06 (×3): 12.5 mg via ORAL
  Filled 2015-12-04 (×4): qty 1

## 2015-12-04 NOTE — Progress Notes (Signed)
Advanced Heart Failure Rounding Note  Primary Physician [name], MD Primary Cardiologist Dr. Haroldine Laws (Last seen 2012)  Subjective:    Presented to ED with increasing pedal edema after lasix being cut in half 2/2 hypotension.   Baseline weight 210 at home. Gets edema in his elbows when he is overloaded. Caretakers keep precise notes on his I/Os at home.  Gets intake of 1700-2000 cc daily.  Watches salt intake. Takes all medicine.  They say his pressure was not low recently, but was as high as 199. Hydralazine was d'c and PCP added clonidine prn for SBPs > 180. He is not very active at home. Work with PT twice a week at home.  Walks from bedroom to living room with SOB. Sits in chair most of the days.  No SOB with ADLs, has assistance for all of his activities.   Out ~ 2L this admit but weight stable overnight. Creatinine stable. On lasix 40 mg IV BID.   Echo 09/2015 LVEF 55-60% with Grade 1 DD, normal RV.  Objective:   Weight Range: 215 lb 9.6 oz (97.796 kg) Body mass index is 30.94 kg/(m^2).   Vital Signs:   Temp:  [97.9 F (36.6 C)-99 F (37.2 C)] 99 F (37.2 C) (02/01 0457) Pulse Rate:  [55-63] 55 (02/01 0457) Resp:  [18] 18 (02/01 0457) BP: (145-158)/(56-67) 155/66 mmHg (02/01 0457) SpO2:  [90 %-99 %] 99 % (02/01 0457) Weight:  [215 lb 9.6 oz (97.796 kg)] 215 lb 9.6 oz (97.796 kg) (02/01 0457) Last BM Date: 12/02/15  Weight change: Filed Weights   12/03/15 0042 12/03/15 0354 12/04/15 0457  Weight: 217 lb 8 oz (98.657 kg) 215 lb 4.8 oz (97.659 kg) 215 lb 9.6 oz (97.796 kg)    Intake/Output:   Intake/Output Summary (Last 24 hours) at 12/04/15 1044 Last data filed at 12/04/15 0825  Gross per 24 hour  Intake    700 ml  Output   1500 ml  Net   -800 ml     Physical Exam: General:  Elderly and chronically ill appearing. HEENT: normal Neck: supple. JVP 10+ cm. Carotids 2+ bilat; no bruits. No thyromegaly or nodule noted. Cor: PMI nondisplaced. Regular,  slightly brady. No M/G/R appreciated. Lungs: Dependent crackles. Abdomen: Obese, soft, NT, ND, no HSM. No bruits or masses. +BS  Extremities: no cyanosis, clubbing, or rash.  1-2 + LE edema into thighs.  1+ in bilaterally elbows. Neuro: alert & orientedx3, cranial nerves grossly intact. moves all 4 extremities w/o difficulty. Affect flat  Telemetry: reviewed personally, NSR 50-60s  Labs: CBC  Recent Labs  12/03/15 0130 12/03/15 1052 12/04/15 0420  WBC 5.2 5.1 5.4  NEUTROABS 2.1 2.2  --   HGB 8.7* 8.3* 7.9*  HCT 28.5* 27.0* 24.9*  MCV 104.8* 103.8* 102.5*  PLT 53* 57* 54*   Basic Metabolic Panel  Recent Labs  12/03/15 1052 12/04/15 0420  NA 141 140  K 3.5 3.7  CL 103 99*  CO2 32 29  GLUCOSE 102* 91  BUN 33* 32*  CREATININE 1.18 1.15  CALCIUM 8.8* 8.6*   Liver Function Tests  Recent Labs  12/03/15 1052  AST 24  ALT 18  ALKPHOS 55  BILITOT 0.3  PROT 6.1*  ALBUMIN 3.3*   No results for input(s): LIPASE, AMYLASE in the last 72 hours. Cardiac Enzymes  Recent Labs  12/03/15 1052  TROPONINI 0.03    BNP: BNP (last 3 results)  Recent Labs  09/20/15 1938 11/04/15 1607 12/03/15 0130  BNP 443.0* 162.6* 240.6*    ProBNP (last 3 results) No results for input(s): PROBNP in the last 8760 hours.   D-Dimer No results for input(s): DDIMER in the last 72 hours. Hemoglobin A1C No results for input(s): HGBA1C in the last 72 hours. Fasting Lipid Panel No results for input(s): CHOL, HDL, LDLCALC, TRIG, CHOLHDL, LDLDIRECT in the last 72 hours. Thyroid Function Tests No results for input(s): TSH, T4TOTAL, T3FREE, THYROIDAB in the last 72 hours.  Invalid input(s): FREET3  Other results:     Imaging/Studies:  Dg Chest 2 View  12/03/2015  CLINICAL DATA:  Shortness of breath tonight.  Volume overload. EXAM: CHEST  2 VIEW COMPARISON:  11/04/2015, 09/20/2015 FINDINGS: Cardiomegaly and tortuous thoracic aorta, unchanged from prior. Again seen eventration of  right hemidiaphragm. Increased bandlike opacity at the right lung base. Mild vascular congestion. No pleural effusion or pneumothorax. Bones are under mineralized. IMPRESSION: Mild vascular congestion.  Increased right basilar atelectasis. Electronically Signed   By: Jeb Levering M.D.   On: 12/03/2015 01:48     Latest Echo  Latest Cath   Medications:     Scheduled Medications: . atorvastatin  10 mg Oral q1800  . azelastine  1 spray Each Nare QHS  . brimonidine  1 drop Left Eye BID  . carvedilol  6.25 mg Oral BID WC  . diazepam  1 mg Oral QHS  . dorzolamide  1 drop Left Eye BID  . dutasteride  0.5 mg Oral Daily  . ferrous sulfate  325 mg Oral Q breakfast  . fluticasone  2 spray Each Nare Daily  . furosemide  40 mg Intravenous BID  . levothyroxine  50 mcg Oral QAC breakfast  . loratadine  10 mg Oral Daily  . losartan  12.5 mg Oral Daily  . pantoprazole  40 mg Oral Daily  . potassium chloride  20 mEq Oral Daily  . sodium chloride flush  3 mL Intravenous Q12H     Infusions:     PRN Medications:  acetaminophen **OR** acetaminophen, dextromethorphan-guaiFENesin, diclofenac sodium, ondansetron **OR** ondansetron (ZOFRAN) IV, traMADol   Assessment/Plan   Victor Green is a 82 yo male with chronic diastolic heart failure, non-obstructive CAD, hypertension and hyperlipidemia here with acute on chronic diastolic heart failure in the setting of medication changes. CHF asked to take over having follow family in the past.   1. Acute on chronic diastolic HF - Echo [id number] LVEF 55-60% with grade 1 DD - He does appear volume overloaded on exam.  - Currently on IV lasix 40 mg IV BID. Can increase to 80 mg BID. Will give extra 40 now.  Watch K. - Check Mg in am. - NYHA class III symptoms.  - Will get PT to see for objective assessment of patients functional status. - on Chronic 02 at home.  2. HTN - SBP remains in 130-150s - Losartan 12.5 mg daily added today.  3.  Non-obstructive CAD - On statin 4. CKD - Follow closely with diuresis 5. HLD - Continue lipitor 10 mg daily.   Length of Stay: 1  Victor Friar PA-C 12/04/2015, 10:44 AM  Advanced Heart Failure Team Pager [phone] (M-F; 7a - 4p)  Please contact [facility] for night-coverage after hours (4p -7a ) and weekends on [email or web address]  Patient seen with PA, agree with the above note.   1. Acute on chronic diastolic CHF: [id number] echo with EF 55-60%.  Lasix was cut back at home by PCP, ?because of low BP but  per caregivers SBP has generally run in the 130s.  On exam, he appears volume overloaded with JVD and edema.  I think this is more than just anasarca from low albumin (albumin level only mildly low at 3.3).   - Lasix 80 mg IV bid for now, follow I/Os closely.  2. HTN: BP has been high here.  With normal EF and in the process of diuresing, would probably hold off on ARB for now and use hydralazine for BP control in addition to Coreg (has had hydralazine at home).  Will add hydralazine 12.5 mg tid and can titrate up as needed.  3. Needs to work with PT.   Victor Green 12/04/2015 12:02 PM

## 2015-12-04 NOTE — Progress Notes (Signed)
[facility]                                                                              Patient Demographics  Victor Green, is a 82 y.o. male, DOB - 01-16-1920, [id number]  Admit date - 12/03/2015   Admitting Physician Rise Patience, MD  Outpatient Primary MD for the patient is Velna Hatchet, MD  LOS - 1   No chief complaint on file.      Brief HPI  Per admit note by Dr. Hal Hope on 1/31 Victor Green is a 82 y.o. male with history of diastolic CHF last EF measured in November 2016 was 55-60% with grade 1 diastolic dysfunction presents with the ER because of increasing lower extremity edema. As per patient's caregiver patient's Lasix dose was decreased by half last week. Patient's blood pressure medicines also was decreased. Over the last few days patient was found to have increasing lower extremity edema extending up to the abdomen. Patient denies any chest pain or shortness of breath. On exam patient has significant lower extremity edema extending up all the way to the abdomen. Chest x-ray shows mild congestion. Patient was admitted for decompensated CHF    Assessment & Plan    Principal Problem:  Acute on chronic diastolic CHF (congestive heart failure), NYHA class 1  - Echo and 11/16 reveals an EF of 55-60% and grade 1 diastolic dysfunction -Appreciate cardiology consult, reviewed recommendations from Dr. Benjamine Mola, placed on Lasix 80 mg IV twice a day - Negative balance of 3.08 L, continue daily weights   Active Problems:  Severe pedal edema -Likely secondary to venous stasis and sitting in a chair and keeping legs in dependent position all day - Continue IV diuresis, physical therapy -Already wears TED stockings   Essential hypertension - Per caregiver, hydralazine was recently stopped. Reviewed cardiology recommendations, restarted hydralazine, continue Coreg  - PT evaluation    Thrombocytopenia, unspecified - chronic - avoid  anticoagulation for DVT- SCDs only  Chronic kidney disease stage III - Follow creatinine closely with diuresis  Nonobstructive coronary artery disease - Continue Coreg, statin  Hyperlipidemia Continue Lipitor   Code Status: Full code  Family Communication: Discussed in detail with the patient, all imaging results, lab results explained to the patient and caregiver    Disposition Plan:  Time Spent in minutes  25 minutes  Procedures  None  Consults   Cardiology  DVT Prophylaxis  SCD's  Medications  Scheduled Meds: . atorvastatin  10 mg Oral q1800  . azelastine  1 spray Each Nare QHS  . brimonidine  1 drop Left Eye BID  . carvedilol  6.25 mg Oral BID WC  . diazepam  1 mg Oral QHS  . dorzolamide  1 drop Left Eye BID  . dutasteride  0.5 mg Oral Daily  . ferrous sulfate  325 mg Oral Q breakfast  . fluticasone  2 spray Each Nare Daily  . furosemide  80 mg Intravenous BID  . [START ON 12/05/2015] hydrALAZINE  12.5 mg Oral 3 times per day  . levothyroxine  50 mcg Oral QAC breakfast  . loratadine  10 mg Oral  Daily  . pantoprazole  40 mg Oral Daily  . potassium chloride  20 mEq Oral Daily  . sodium chloride flush  3 mL Intravenous Q12H   Continuous Infusions:  PRN Meds:.acetaminophen **OR** acetaminophen, dextromethorphan-guaiFENesin, diclofenac sodium, ondansetron **OR** ondansetron (ZOFRAN) IV, traMADol   Antibiotics   Anti-infectives    None        Subjective:   Victor Green was seen and examined today. Denies any shortness of breath. Patient denies dizziness, chest pain, abdominal pain, N/V/D/C, new weakness, numbess, tingling. No acute events overnight.    Objective:   Blood pressure 127/49, pulse 55, temperature 98.1 F (36.7 C), temperature source Oral, resp. rate 18, height 5\' 10"  (1.778 m), weight 97.796 kg (215 lb 9.6 oz), SpO2 97 %.  Wt Readings from Last 3 Encounters:  12/04/15 97.796 kg (215 lb 9.6 oz)  11/04/15 93.441 kg (206 lb)  09/22/15  93.441 kg (206 lb)     Intake/Output Summary (Last 24 hours) at 12/04/15 1236 Last data filed at 12/04/15 1124  Gross per 24 hour  Intake    700 ml  Output   2635 ml  Net  -1935 ml    Exam  General: Alert and oriented x 3, NAD  HEENT:  PERRLA, EOMI, Anicteric Sclera, mucous membranes moist.   Neck: Supple, no JVD, no masses  CVS: S1 S2 auscultated, no rubs, murmurs or gallops. Regular rate and rhythm.  Respiratory: Decreased breath sounds at the bases  Abdomen: Soft, nontender, nondistended, + bowel sounds  Ext: no cyanosis clubbing, 2+ edema  Neuro: AAOx3, Cr N's II- XII. Strength 5/5 upper and lower extremities bilaterally  Skin: No rashes  Psych: Normal affect and demeanor, alert and oriented x3    Data Review   Micro Results No results found for this or any previous visit (from the past 240 hour(s)).  Radiology Reports Dg Chest 2 View  12/03/2015  CLINICAL DATA:  Shortness of breath tonight.  Volume overload. EXAM: CHEST  2 VIEW COMPARISON:  11/04/2015, 09/20/2015 FINDINGS: Cardiomegaly and tortuous thoracic aorta, unchanged from prior. Again seen eventration of right hemidiaphragm. Increased bandlike opacity at the right lung base. Mild vascular congestion. No pleural effusion or pneumothorax. Bones are under mineralized. IMPRESSION: Mild vascular congestion.  Increased right basilar atelectasis. Electronically Signed   By: Jeb Levering M.D.   On: 12/03/2015 01:48   Dg Chest 2 View  11/04/2015  CLINICAL DATA:  c/o new onset wet cough and worsening leg swelling x2 weeks per home health care NP. Upon arrival pt. O2 saturation 88%. EMS gave 2L Oxygen via [place], which brought O2 up to 95%. Pt. Takes 40mg  lasix for protein issues. Pt. Denies C.*comment was truncated* EXAM: CHEST  2 VIEW COMPARISON:  09/20/2015 FINDINGS: Elevated right hemidiaphragm with bandlike opacity medially at the right lung base favoring atelectasis. Mild to moderate enlargement of the  cardiopericardial silhouette without edema. Atherosclerotic aortic arch. The patient is rotated to the right on today's radiograph, reducing diagnostic sensitivity and specificity. There is evidence of chronic rotator cuff tear on the right with elimination of the acromiohumeral space. Lower thoracic levoconvex scoliosis with associated spondylosis. IMPRESSION: 1. Elevated right hemidiaphragm with increased bandlike opacity medially at the right lung base favoring atelectasis. 2. Mild to moderate enlargement of the cardiopericardial silhouette without edema. 3. Atherosclerosis. 4. Chronic right rotator cuff tear. Electronically Signed   By: Van Clines M.D.   On: 11/04/2015 16:55    CBC  Recent Labs Lab 12/03/15 0130 12/03/15  1052 12/04/15 0420  WBC 5.2 5.1 5.4  HGB 8.7* 8.3* 7.9*  HCT 28.5* 27.0* 24.9*  PLT 53* 57* 54*  MCV 104.8* 103.8* 102.5*  MCH 32.0 31.9 32.5  MCHC 30.5 30.7 31.7  RDW 18.5* 18.5* 18.4*  LYMPHSABS 2.4 2.2  --   MONOABS 0.6 0.6  --   EOSABS 0.1 0.1  --   BASOSABS 0.0 0.0  --     Chemistries   Recent Labs Lab 12/03/15 0130 12/03/15 1052 12/04/15 0420  NA 141 141 140  K 4.2 3.5 3.7  CL 98* 103 99*  CO2 31 32 29  GLUCOSE 124* 102* 91  BUN 34* 33* 32*  CREATININE 1.21 1.18 1.15  CALCIUM 8.9 8.8* 8.6*  AST  --  24  --   ALT  --  18  --   ALKPHOS  --  55  --   BILITOT  --  0.3  --    ------------------------------------------------------------------------------------------------------------------ estimated creatinine clearance is 47.1 mL/min (by C-G formula based on Cr of 1.15). ------------------------------------------------------------------------------------------------------------------ No results for input(s): HGBA1C in the last 72 hours. ------------------------------------------------------------------------------------------------------------------ No results for input(s): CHOL, HDL, LDLCALC, TRIG, CHOLHDL, LDLDIRECT in the last 72  hours. ------------------------------------------------------------------------------------------------------------------ No results for input(s): TSH, T4TOTAL, T3FREE, THYROIDAB in the last 72 hours.  Invalid input(s): FREET3 ------------------------------------------------------------------------------------------------------------------  Recent Labs  12/04/15 0929  VITAMINB12 1468*  FERRITIN 355*  TIBC 252  IRON 81  RETICCTPCT 3.4*    Coagulation profile No results for input(s): INR, PROTIME in the last 168 hours.  No results for input(s): DDIMER in the last 72 hours.  Cardiac Enzymes  Recent Labs Lab 12/03/15 1052  TROPONINI 0.03   ------------------------------------------------------------------------------------------------------------------ Invalid input(s): POCBNP  No results for input(s): GLUCAP in the last 72 hours.   Ceira Hoeschen M.D. [facility] 12/04/2015, 12:36 PM  Pager: [phone] Between 7am to 7pm - call Pager - [phone]  After 7pm go to [email or web address] - password [email or web address]  Call night coverage person covering after 7pm

## 2015-12-04 NOTE — Evaluation (Signed)
Physical Therapy Evaluation Patient Details Name: Victor Green MRN: [id number] DOB: 06/12/1920 Today's Date: 12/04/2015   History of Present Illness  82 yo male with chronic diastolic heart failure, non-obstructive CAD, hypertension and hyperlipidemia here with acute on chronic diastolic heart failure   Clinical Impression   Patient demonstrates deficits in functional mobility as indicated below. Will need continued skilled PT to address deficits and maximize function. Will see as indicated and progress as tolerated.   Spoke with patient's caregivers regarding patient functional status. They stated that usually he is able to walk to the bathroom, transfer to the chair with assist, and tolerates PT services. Today was the first time that they had to lower him to the ground for a controlled fall in quite some time.  Patient was very self limiting, possibly related to increased fear of falling. At this time, feel patient will most like need hoyer lift for discharge with continued physical assist. Will trial acute PT services based on willingness to participate.     Follow Up Recommendations Home health PT;Supervision/Assistance - 24 hour    Equipment Recommendations  Other (comment) (hoyer lift)    Recommendations for Other Services       Precautions / Restrictions Precautions Precautions: Fall (had fall while here) Restrictions Weight Bearing Restrictions: No      Mobility  Bed Mobility Overal bed mobility: Needs Assistance Bed Mobility: Sit to Supine       Sit to supine: Mod assist      Transfers Overall transfer level: Needs assistance Equipment used: Rolling walker (2 wheeled);2 person hand held assist;Ambulation equipment used Transfers: Sit to/from Stand;Anterior-Posterior Wellsite geologist transfers: Max assist;+2 physical assistance;+2 safety/equipment   General transfer comment: attempted x4 with +2 max assist.. Had to utilize +2 assist with  A/P transfer to bed. Manual assist to elevate LEs up onto bed surface, assist to mobilize patient from bed to chair (+3)   Ambulation/Gait                Stairs            Wheelchair Mobility    Modified Rankin (Stroke Patients Only)       Balance                                             Pertinent Vitals/Pain Pain Assessment: Faces Faces Pain Scale: Hurts little more Pain Location: bilateral LEs Pain Descriptors / Indicators: Guarding;Grimacing;Moaning    Home Living Family/patient expects to be discharged to:: Private residence Living Arrangements: Other (Comment) (24 hour caregivers x2) Available Help at Discharge: Family;Available 24 hours/day;Personal care attendant Type of Home: House Home Access: Ramped entrance     Home Layout: Able to live on main level with bedroom/bathroom Home Equipment: Walker - 2 wheels;Walker - 4 wheels;Bedside commode;Wheelchair - manual;Hospital bed      Prior Function Level of Independence: Needs assistance   Gait / Transfers Assistance Needed: can usually walk with RW across the room with assist per caregivers  ADL's / Homemaking Assistance Needed: needs assist for ADLs        Hand Dominance   Dominant Hand: Right    Extremity/Trunk Assessment   Upper Extremity Assessment: Defer to OT evaluation           Lower Extremity Assessment: Generalized weakness (increased BLE edema, arthritic changes noted)  Cervical / Trunk Assessment:  (increased body habitus)  Communication   Communication: HOH  Cognition Arousal/Alertness: Awake/alert Behavior During Therapy: Anxious Overall Cognitive Status: History of cognitive impairments - at baseline Area of Impairment: Following commands;Safety/judgement;Awareness;Problem solving       Following Commands: Follows one step commands with increased time Safety/Judgement: Decreased awareness of safety;Decreased awareness of  deficits Awareness: Intellectual Problem Solving: Slow processing;Decreased initiation;Difficulty sequencing;Requires verbal cues;Requires tactile cues General Comments: patient very deflective when anxious    General Comments      Exercises        Assessment/Plan    PT Assessment Patient needs continued PT services  PT Diagnosis Difficulty walking;Abnormality of gait;Generalized weakness;Altered mental status   PT Problem List Decreased strength;Decreased range of motion;Decreased activity tolerance;Decreased balance;Decreased mobility;Decreased cognition;Decreased knowledge of use of DME;Decreased safety awareness;Obesity;Pain  PT Treatment Interventions DME instruction;Gait training;Functional mobility training;Therapeutic activities;Therapeutic exercise;Balance training;Neuromuscular re-education;Patient/family education   PT Goals (Current goals can be found in the Care Plan section) Acute Rehab PT Goals Patient Stated Goal: to go home PT Goal Formulation: With patient/family Time For Goal Achievement: 12/18/15 Potential to Achieve Goals: Fair    Frequency Min 2X/week   Barriers to discharge        Co-evaluation               End of Session Equipment Utilized During Treatment: Gait belt;Oxygen Activity Tolerance: Patient limited by fatigue;Other (comment) (self limiting) Patient left: in bed;with call bell/phone within reach;with family/visitor present Nurse Communication: Mobility status         Time: [id number] PT Time Calculation (min) (ACUTE ONLY): 18 min   Charges:   PT Evaluation $PT Eval Moderate Complexity: 1 Procedure     PT G CodesDuncan Dull 12-07-15, 3:49 PM  Alben Deeds, [place] DPT  [phone]

## 2015-12-05 LAB — BASIC METABOLIC PANEL
Anion gap: 10 (ref 5–15)
BUN: 31 mg/dL — AB (ref 6–20)
CHLORIDE: 96 mmol/L — AB (ref 101–111)
CO2: 33 mmol/L — AB (ref 22–32)
CREATININE: 1.21 mg/dL (ref 0.61–1.24)
Calcium: 8.4 mg/dL — ABNORMAL LOW (ref 8.9–10.3)
GFR calc Af Amer: 58 mL/min — ABNORMAL LOW (ref >60)
GFR calc non Af Amer: 50 mL/min — ABNORMAL LOW (ref >60)
Glucose, Bld: 109 mg/dL — ABNORMAL HIGH (ref 65–99)
Potassium: 3.2 mmol/L — ABNORMAL LOW (ref 3.5–5.1)
SODIUM: 139 mmol/L (ref 135–145)

## 2015-12-05 LAB — OCCULT BLOOD X 1 CARD TO LAB, STOOL: Fecal Occult Bld: NEGATIVE

## 2015-12-05 LAB — MAGNESIUM: Magnesium: 1.8 mg/dL (ref 1.7–2.4)

## 2015-12-05 MED ORDER — POTASSIUM CHLORIDE CRYS ER 20 MEQ PO TBCR
40.0000 meq | EXTENDED_RELEASE_TABLET | Freq: Once | ORAL | Status: AC
Start: 2015-12-05 — End: 2015-12-05
  Administered 2015-12-05: 40 meq via ORAL
  Filled 2015-12-05: qty 2

## 2015-12-05 MED ORDER — MAGNESIUM SULFATE 50 % IJ SOLN
3.0000 g | Freq: Once | INTRAVENOUS | Status: AC
  Administered 2015-12-05: 3 g via INTRAVENOUS
  Filled 2015-12-05: qty 6

## 2015-12-05 MED ORDER — POTASSIUM CHLORIDE CRYS ER 20 MEQ PO TBCR
40.0000 meq | EXTENDED_RELEASE_TABLET | Freq: Once | ORAL | Status: AC
  Administered 2015-12-05: 40 meq via ORAL
  Filled 2015-12-05: qty 2

## 2015-12-05 NOTE — Progress Notes (Signed)
[facility]  Patient Status: Active (receiving services up to time of hospitalization)  [facility] is providing the following services: PT and OT  If patient discharges after hours, please call [phone].   Victor Green 12/05/2015, 8:35 AM

## 2015-12-05 NOTE — Progress Notes (Signed)
[facility]                                                                              Patient Demographics  Victor Green, is a 82 y.o. male, DOB - 09-Jun-1920, [id number]  Admit date - 12/03/2015   Admitting Physician Rise Patience, MD  Outpatient Primary MD for the patient is Velna Hatchet, MD  LOS - 2   No chief complaint on file.      Brief HPI  Per admit note by Dr. Hal Hope on 1/31 Victor Green is a 82 y.o. male with history of diastolic CHF last EF measured in November 2016 was 55-60% with grade 1 diastolic dysfunction presents with the ER because of increasing lower extremity edema. As per patient's caregiver patient's Lasix dose was decreased by half last week. Patient's blood pressure medicines also was decreased. Over the last few days patient was found to have increasing lower extremity edema extending up to the abdomen. Patient denies any chest pain or shortness of breath. On exam patient has significant lower extremity edema extending up all the way to the abdomen. Chest x-ray shows mild congestion. Patient was admitted for decompensated CHF    Assessment & Plan    Principal Problem:  Acute on chronic diastolic CHF (congestive heart failure), NYHA class 1  - Echo and 11/16 reveals an EF of 55-60% and grade 1 diastolic dysfunction - Cardiology following, continue IV diuresis, on Lasix 80 mg IV twice a day - Negative balance of 3.3 L, continue daily weights   Active Problems:  Severe pedal edema -Likely secondary to venous stasis and sitting in a chair and keeping legs in dependent position all day - Continue IV diuresis, physical therapy -Already wears TED stockings   Essential hypertension - Per caregiver, hydralazine was recently stopped. Reviewed cardiology recommendations, restarted hydralazine, continue Coreg  - PT evaluation recommended home health PT, supervision 24 7   Thrombocytopenia, unspecified - chronic -  avoid anticoagulation for DVT- SCDs only  Chronic kidney disease stage III - Follow creatinine closely with diuresis  Nonobstructive coronary artery disease - Continue Coreg, statin  Hyperlipidemia Continue Lipitor   Code Status: Full code  Family Communication: Discussed in detail with the patient, all imaging results, lab results explained to the patient and caregiver    Disposition Plan:  Time Spent in minutes  25 minutes  Procedures  None  Consults   Cardiology  DVT Prophylaxis  SCD's  Medications  Scheduled Meds: . atorvastatin  10 mg Oral q1800  . azelastine  1 spray Each Nare QHS  . brimonidine  1 drop Left Eye BID  . carvedilol  6.25 mg Oral BID WC  . diazepam  1 mg Oral QHS  . dorzolamide  1 drop Left Eye BID  . dutasteride  0.5 mg Oral Daily  . ferrous sulfate  325 mg Oral Q breakfast  . fluticasone  2 spray Each Nare Daily  . furosemide  80 mg Intravenous BID  . hydrALAZINE  12.5 mg Oral 3 times per day  . levothyroxine  50 mcg Oral QAC breakfast  . loratadine  10 mg Oral  Daily  . magnesium sulfate LVP 250-500 ml  3 g Intravenous Once  . pantoprazole  40 mg Oral Daily  . potassium chloride  20 mEq Oral Daily  . potassium chloride  40 mEq Oral Once  . sodium chloride flush  3 mL Intravenous Q12H   Continuous Infusions:  PRN Meds:.acetaminophen **OR** acetaminophen, dextromethorphan-guaiFENesin, diclofenac sodium, ondansetron **OR** ondansetron (ZOFRAN) IV, traMADol   Antibiotics   Anti-infectives    None        Subjective:   Victor Green was seen and examined today. Reporting dizziness since last night, does not appear to be vertigo. Patient denies chest pain, abdominal pain, N/V/D/C, new weakness, numbess, tingling. No acute events overnight.    Objective:   Blood pressure 141/64, pulse 67, temperature 98.4 F (36.9 C), temperature source Oral, resp. rate 18, height 5\' 10"  (1.778 m), weight 95.709 kg (211 lb), SpO2 96 %.  Wt  Readings from Last 3 Encounters:  12/05/15 95.709 kg (211 lb)  11/04/15 93.441 kg (206 lb)  09/22/15 93.441 kg (206 lb)     Intake/Output Summary (Last 24 hours) at 12/05/15 1303 Last data filed at 12/05/15 0950  Gross per 24 hour  Intake   2448 ml  Output   3025 ml  Net   -577 ml    Exam  General: Alert and oriented x 3, NAD  HEENT:  PERRLA, EOMI, Anicteric Sclera, mucous membranes moist.   Neck: Supple, no JVD, no masses  CVS: S1 S2 clear, RRR  Respiratory: Decreased breath sounds at the bases  Abdomen: Soft, nontender, nondistended, + bowel sounds  Ext: no cyanosis clubbing, 2+ edema  Neuro: no new changes  Skin: No rashes  Psych: Normal affect and demeanor, alert and oriented x3    Data Review   Micro Results No results found for this or any previous visit (from the past 240 hour(s)).  Radiology Reports Dg Chest 2 View  12/03/2015  CLINICAL DATA:  Shortness of breath tonight.  Volume overload. EXAM: CHEST  2 VIEW COMPARISON:  11/04/2015, 09/20/2015 FINDINGS: Cardiomegaly and tortuous thoracic aorta, unchanged from prior. Again seen eventration of right hemidiaphragm. Increased bandlike opacity at the right lung base. Mild vascular congestion. No pleural effusion or pneumothorax. Bones are under mineralized. IMPRESSION: Mild vascular congestion.  Increased right basilar atelectasis. Electronically Signed   By: Jeb Levering M.D.   On: 12/03/2015 01:48    CBC  Recent Labs Lab 12/03/15 0130 12/03/15 1052 12/04/15 0420  WBC 5.2 5.1 5.4  HGB 8.7* 8.3* 7.9*  HCT 28.5* 27.0* 24.9*  PLT 53* 57* 54*  MCV 104.8* 103.8* 102.5*  MCH 32.0 31.9 32.5  MCHC 30.5 30.7 31.7  RDW 18.5* 18.5* 18.4*  LYMPHSABS 2.4 2.2  --   MONOABS 0.6 0.6  --   EOSABS 0.1 0.1  --   BASOSABS 0.0 0.0  --     Chemistries   Recent Labs Lab 12/03/15 0130 12/03/15 1052 12/04/15 0420 12/05/15 0445  NA 141 141 140 139  K 4.2 3.5 3.7 3.2*  CL 98* 103 99* 96*  CO2 31 32 29 33*   GLUCOSE 124* 102* 91 109*  BUN 34* 33* 32* 31*  CREATININE 1.21 1.18 1.15 1.21  CALCIUM 8.9 8.8* 8.6* 8.4*  MG  --   --   --  1.8  AST  --  24  --   --   ALT  --  18  --   --   ALKPHOS  --  55  --   --  BILITOT  --  0.3  --   --    ------------------------------------------------------------------------------------------------------------------ estimated creatinine clearance is 44.3 mL/min (by C-G formula based on Cr of 1.21). ------------------------------------------------------------------------------------------------------------------ No results for input(s): HGBA1C in the last 72 hours. ------------------------------------------------------------------------------------------------------------------ No results for input(s): CHOL, HDL, LDLCALC, TRIG, CHOLHDL, LDLDIRECT in the last 72 hours. ------------------------------------------------------------------------------------------------------------------ No results for input(s): TSH, T4TOTAL, T3FREE, THYROIDAB in the last 72 hours.  Invalid input(s): FREET3 ------------------------------------------------------------------------------------------------------------------  Recent Labs  12/04/15 0929  VITAMINB12 1468*  FOLATE 48.6  FERRITIN 355*  TIBC 252  IRON 81  RETICCTPCT 3.4*    Coagulation profile No results for input(s): INR, PROTIME in the last 168 hours.  No results for input(s): DDIMER in the last 72 hours.  Cardiac Enzymes  Recent Labs Lab 12/03/15 1052  TROPONINI 0.03   ------------------------------------------------------------------------------------------------------------------ Invalid input(s): POCBNP  No results for input(s): GLUCAP in the last 72 hours.   Jaziah Goeller M.D. [facility] 12/05/2015, 1:03 PM  Pager: [phone] Between 7am to 7pm - call Pager - [phone]  After 7pm go to [email or web address] - password [email or web address]  Call night coverage person covering after 7pm

## 2015-12-05 NOTE — Progress Notes (Signed)
Physical Therapy Treatment Patient Details Name: Victor Green MRN: [id number] DOB: 1920/10/15 Today's Date: 12/05/2015    History of Present Illness 82 yo male with chronic diastolic heart failure, non-obstructive CAD, hypertension and hyperlipidemia here with acute on chronic diastolic heart failure     PT Comments    Patient remains very anxious this session. Very reluctant for EOB activity, assisted to EOB and performed there ex with assist for bilateral LEs and trunk. HR 110s and O2 saturations 97% throughout session on 3 liters during spot checks.  Patient remains very deflective and anxious to engage in activity, thus limiting participation. Will continue to see and progress as able.   Follow Up Recommendations  Home health PT;Supervision/Assistance - 24 hour     Equipment Recommendations  Other (comment) (hoyer lift)    Recommendations for Other Services       Precautions / Restrictions Precautions Precautions: Fall Restrictions Weight Bearing Restrictions: No    Mobility  Bed Mobility Overal bed mobility: Needs Assistance Bed Mobility: Supine to Sit;Sit to Supine     Supine to sit: Max assist Sit to supine: Mod assist   General bed mobility comments: +2 max assist to EOB with helicopter technique using chuck pad. Patient very anxious upon coming to EOB but tolerated it well once there.  Transfers                 General transfer comment: deferred, bed level session performed  Ambulation/Gait                 Stairs            Wheelchair Mobility    Modified Rankin (Stroke Patients Only)       Balance Overall balance assessment: Needs assistance Sitting-balance support: Feet supported Sitting balance-Leahy Scale: Fair Sitting balance - Comments: sat EOB for dynamic trunk activities ~ 10 minutes Postural control: Posterior lean                          Cognition Arousal/Alertness: Awake/alert Behavior During  Therapy: Anxious Overall Cognitive Status: History of cognitive impairments - at baseline Area of Impairment: Following commands;Safety/judgement;Awareness;Problem solving       Following Commands: Follows one step commands with increased time Safety/Judgement: Decreased awareness of safety;Decreased awareness of deficits Awareness: Intellectual Problem Solving: Slow processing;Decreased initiation;Difficulty sequencing;Requires verbal cues;Requires tactile cues General Comments: patient very deflective when anxious    Exercises General Exercises - Lower Extremity Ankle Circles/Pumps: AAROM;Both;10 reps Long Arc Quad: AROM;Both;10 reps (sitting EOB) Heel Slides: PROM (increased knee pain) Other Exercises Other Exercises: trunk control activities at EOB, reaching Other Exercises: lateral leans Other Exercises: trunk flexion/extension with assist    General Comments        Pertinent Vitals/Pain Pain Assessment: Faces Faces Pain Scale: Hurts little more Pain Location: bilateral LEs Pain Descriptors / Indicators: Guarding;Grimacing;Moaning    Home Living                      Prior Function            PT Goals (current goals can now be found in the care plan section) Acute Rehab PT Goals Patient Stated Goal: to go home PT Goal Formulation: With patient/family Time For Goal Achievement: 12/18/15 Potential to Achieve Goals: Fair Progress towards PT goals: Not progressing toward goals - comment (anxious due to fear of falling)    Frequency  Min 2X/week    PT  Plan Current plan remains appropriate    Co-evaluation             End of Session Equipment Utilized During Treatment: Oxygen Activity Tolerance: Patient limited by fatigue;Other (comment) (self limiting due to fear) Patient left: in bed;with call bell/phone within reach;with family/visitor present     Time: [id number] PT Time Calculation (min) (ACUTE ONLY): 22 min  Charges:  $Therapeutic  Activity: 8-22 mins                    G CodesDuncan Dull Dec 17, 2015, 2:21 PM  Alben Deeds, [place] DPT  [phone]

## 2015-12-05 NOTE — Progress Notes (Signed)
Advanced Heart Failure Rounding Note  Primary Physician [name], MD Primary Cardiologist Dr. Haroldine Laws (Last seen 2012)  Subjective:    Presented to ED with increasing pedal edema after lasix being cut in half 2/2 hypotension.  Worked with PT. Very self limited with fear of falling.  Recommend HH PT and 24 hr supervision.   Feels good today. Much more lively. Nervous to work with PT. Caretakers were bathing him yesterday and he felt weak and had to be lowered to the floor. Denies CP, lightheadedness, or dizziness today. No acute SOB.   Out ~ 1.7 L x 24 hrs. Weight down 4 lbs.  Creatinine stable on lasix 80 mg IV BID.   Echo 09/2015 LVEF 55-60% with Grade 1 DD, normal RV.  Objective:   Weight Range: 211 lb (95.709 kg) Body mass index is 30.28 kg/(m^2).   Vital Signs:   Temp:  [98.1 F (36.7 C)-98.4 F (36.9 C)] 98.4 F (36.9 C) (02/02 0625) Pulse Rate:  [55-67] 67 (02/02 0625) Resp:  [18] 18 (02/02 0625) BP: (126-141)/(49-64) 141/64 mmHg (02/02 0625) SpO2:  [96 %-97 %] 96 % (02/02 0625) Weight:  [211 lb (95.709 kg)] 211 lb (95.709 kg) (02/02 0625) Last BM Date: 12/02/15  Weight change: Filed Weights   12/03/15 0354 12/04/15 0457 12/05/15 0625  Weight: 215 lb 4.8 oz (97.659 kg) 215 lb 9.6 oz (97.796 kg) 211 lb (95.709 kg)    Intake/Output:   Intake/Output Summary (Last 24 hours) at 12/05/15 1001 Last data filed at 12/05/15 0950  Gross per 24 hour  Intake   2508 ml  Output   3475 ml  Net   -967 ml     Physical Exam: General:  Elderly and chronically ill appearing. HEENT: normal Neck: supple. JVP 9-10+ cm. Carotids 2+ bilat; no bruits. No thyromegaly or lymphadenopathy noted. Cor: PMI nondisplaced. Regular, slightly brady. No M/G/R appreciated. Lungs: Dependent crackles with mildly diminished bases Abdomen: Obese, soft, NT, mildly distended, no HSM. No bruits or masses. +BS  Extremities: no cyanosis, clubbing, or rash.  1+ LE edema into thighs.   Trace-1+ in bilaterally elbows. Neuro: alert & orientedx3, cranial nerves grossly intact. moves all 4 extremities w/o difficulty. Affect normal  Telemetry: reviewed personally, NSR 50-60s  Labs: CBC  Recent Labs  12/03/15 0130 12/03/15 1052 12/04/15 0420  WBC 5.2 5.1 5.4  NEUTROABS 2.1 2.2  --   HGB 8.7* 8.3* 7.9*  HCT 28.5* 27.0* 24.9*  MCV 104.8* 103.8* 102.5*  PLT 53* 57* 54*   Basic Metabolic Panel  Recent Labs  12/04/15 0420 12/05/15 0445  NA 140 139  K 3.7 3.2*  CL 99* 96*  CO2 29 33*  GLUCOSE 91 109*  BUN 32* 31*  CREATININE 1.15 1.21  CALCIUM 8.6* 8.4*  MG  --  1.8   Liver Function Tests  Recent Labs  12/03/15 1052  AST 24  ALT 18  ALKPHOS 55  BILITOT 0.3  PROT 6.1*  ALBUMIN 3.3*   No results for input(s): LIPASE, AMYLASE in the last 72 hours. Cardiac Enzymes  Recent Labs  12/03/15 1052  TROPONINI 0.03    BNP: BNP (last 3 results)  Recent Labs  09/20/15 1938 11/04/15 1607 12/03/15 0130  BNP 443.0* 162.6* 240.6*    ProBNP (last 3 results) No results for input(s): PROBNP in the last 8760 hours.   D-Dimer No results for input(s): DDIMER in the last 72 hours. Hemoglobin A1C No results for input(s): HGBA1C in the last 72 hours.  Fasting Lipid Panel No results for input(s): CHOL, HDL, LDLCALC, TRIG, CHOLHDL, LDLDIRECT in the last 72 hours. Thyroid Function Tests No results for input(s): TSH, T4TOTAL, T3FREE, THYROIDAB in the last 72 hours.  Invalid input(s): FREET3  Other results:     Imaging/Studies:  No results found.  Latest Echo  Latest Cath   Medications:     Scheduled Medications: . atorvastatin  10 mg Oral q1800  . azelastine  1 spray Each Nare QHS  . brimonidine  1 drop Left Eye BID  . carvedilol  6.25 mg Oral BID WC  . diazepam  1 mg Oral QHS  . dorzolamide  1 drop Left Eye BID  . dutasteride  0.5 mg Oral Daily  . ferrous sulfate  325 mg Oral Q breakfast  . fluticasone  2 spray Each Nare Daily  .  furosemide  80 mg Intravenous BID  . hydrALAZINE  12.5 mg Oral 3 times per day  . levothyroxine  50 mcg Oral QAC breakfast  . loratadine  10 mg Oral Daily  . magnesium sulfate LVP 250-500 ml  3 g Intravenous Once  . pantoprazole  40 mg Oral Daily  . potassium chloride  20 mEq Oral Daily  . potassium chloride  40 mEq Oral Once  . sodium chloride flush  3 mL Intravenous Q12H    Infusions:    PRN Medications: acetaminophen **OR** acetaminophen, dextromethorphan-guaiFENesin, diclofenac sodium, ondansetron **OR** ondansetron (ZOFRAN) IV, traMADol   Assessment/Plan   Victor Green is a 82 yo male with chronic diastolic heart failure, non-obstructive CAD, hypertension and hyperlipidemia here with acute on chronic diastolic heart failure in the setting of medication changes. CHF asked to take over having follow family in the past.   1. Acute on chronic diastolic HF - Echo [id number] LVEF 55-60% with grade 1 DD - He remains volume overloaded on exam with edema in thighs, elbows, and + JVP. - Continue IV lasix 80 mg BID.  - Supp K and Mg. - NYHA class III symptoms.  - on Chronic 02 at home.  - PT recommends HHPT with 24 hr supervision.  2. HTN - SBP remains in 130-140s - Received losartan 12.5 mg yesterday.  Starting on hydralazine 12.5 mg  TID today.  Will follow. 3. Non-obstructive CAD - continue statin 4. CKD - Stable.  - Follow closely with diuresis 5. HLD - Continue lipitor 10 mg daily.   Length of Stay: 2  Shirley Friar PA-C 12/05/2015, 10:01 AM  Advanced Heart Failure Team Pager [phone] (M-F; 7a - 4p)  Please contact [facility] for night-coverage after hours (4p -7a ) and weekends on [email or web address]  Patient seen with PA, agree with the above note.  Weight down with IV Lasix, still volume overloaded on exam.  Very weak.  - Continue Lasix 80 mg IV bid and replace K.  - He will need ongoing work with PT, already has round-the-clock care at home.   Loralie Champagne 12/05/2015

## 2015-12-06 DIAGNOSIS — N179 Acute kidney failure, unspecified: Secondary | ICD-10-CM

## 2015-12-06 LAB — CBC
HEMATOCRIT: 26.4 % — AB (ref 39.0–52.0)
Hemoglobin: 8.4 g/dL — ABNORMAL LOW (ref 13.0–17.0)
MCH: 32.6 pg (ref 26.0–34.0)
MCHC: 31.8 g/dL (ref 30.0–36.0)
MCV: 102.3 fL — AB (ref 78.0–100.0)
PLATELETS: 49 10*3/uL — AB (ref 150–400)
RBC: 2.58 MIL/uL — AB (ref 4.22–5.81)
RDW: 18.5 % — ABNORMAL HIGH (ref 11.5–15.5)
WBC: 7 10*3/uL (ref 4.0–10.5)

## 2015-12-06 LAB — BASIC METABOLIC PANEL
Anion gap: 11 (ref 5–15)
BUN: 30 mg/dL — ABNORMAL HIGH (ref 6–20)
CHLORIDE: 98 mmol/L — AB (ref 101–111)
CO2: 33 mmol/L — AB (ref 22–32)
CREATININE: 1.32 mg/dL — AB (ref 0.61–1.24)
Calcium: 8.7 mg/dL — ABNORMAL LOW (ref 8.9–10.3)
GFR calc non Af Amer: 45 mL/min — ABNORMAL LOW (ref >60)
GFR, EST AFRICAN AMERICAN: 52 mL/min — AB (ref >60)
Glucose, Bld: 125 mg/dL — ABNORMAL HIGH (ref 65–99)
Potassium: 3.8 mmol/L (ref 3.5–5.1)
Sodium: 142 mmol/L (ref 135–145)

## 2015-12-06 MED ORDER — TORSEMIDE 20 MG PO TABS
40.0000 mg | ORAL_TABLET | Freq: Every day | ORAL | Status: DC
  Administered 2015-12-07 – 2015-12-09 (×3): 40 mg via ORAL
  Filled 2015-12-06 (×3): qty 2

## 2015-12-06 MED ORDER — SALINE SPRAY 0.65 % NA SOLN
1.0000 | NASAL | Status: DC | PRN
  Administered 2015-12-06: 1 via NASAL
  Filled 2015-12-06: qty 44

## 2015-12-06 MED ORDER — METOLAZONE 2.5 MG PO TABS
2.5000 mg | ORAL_TABLET | Freq: Once | ORAL | Status: AC
  Administered 2015-12-06: 2.5 mg via ORAL
  Filled 2015-12-06: qty 1

## 2015-12-06 MED ORDER — FUROSEMIDE 40 MG PO TABS: 40.0000 mg | ORAL_TABLET | Freq: Two times a day (BID) | ORAL | Status: DC

## 2015-12-06 MED ORDER — HYDRALAZINE HCL 25 MG PO TABS
12.5000 mg | ORAL_TABLET | Freq: Once | ORAL | Status: AC
  Administered 2015-12-06: 12.5 mg via ORAL
  Filled 2015-12-06: qty 1

## 2015-12-06 MED ORDER — FUROSEMIDE 10 MG/ML IJ SOLN
80.0000 mg | Freq: Once | INTRAMUSCULAR | Status: AC
  Administered 2015-12-06: 80 mg via INTRAVENOUS
  Filled 2015-12-06: qty 8

## 2015-12-06 MED ORDER — HYDRALAZINE HCL 25 MG PO TABS
25.0000 mg | ORAL_TABLET | Freq: Three times a day (TID) | ORAL | Status: DC
  Administered 2015-12-06 – 2015-12-09 (×9): 25 mg via ORAL
  Filled 2015-12-06 (×9): qty 1

## 2015-12-06 NOTE — Progress Notes (Addendum)
Advanced Heart Failure Rounding Note  Primary Physician [name], MD Primary Cardiologist Dr. Haroldine Laws (Last seen 2012)  Subjective:    Presented to ED with increasing pedal edema after lasix being cut in half 2/2 hypotension.  Working with PT. Very resistant to exercising. Afraid of falling, even with edge of bed exercises.   Feels great this morning. Still doesn't want to work with PT.  Wants to go home.  Denies SOB or CP.  Nose dry with trace blood from [place].   Good output yesterday but I/O nearly even. Weight down 1 lb. Still a few lbs up from previous recorded baseline by our records, although caretakers say his "baseline" at home is 210 lbs. Cr trending 1.15 -> 1.21 -> 1.32.  Echo 09/2015 LVEF 55-60% with Grade 1 DD, normal RV.  Objective:   Weight Range: 210 lb 1.6 oz (95.301 kg) Body mass index is 30.15 kg/(m^2).   Vital Signs:   Temp:  [97.6 F (36.4 C)-98.7 F (37.1 C)] 98.7 F (37.1 C) (02/03 0448) Pulse Rate:  [60-68] 60 (02/03 0448) Resp:  [18-20] 18 (02/03 0448) BP: (116-156)/(57-67) 156/67 mmHg (02/03 0448) SpO2:  [95 %-99 %] 99 % (02/03 0448) Weight:  [210 lb 1.6 oz (95.301 kg)] 210 lb 1.6 oz (95.301 kg) (02/03 0448) Last BM Date: 12/02/15  Weight change: Filed Weights   12/04/15 0457 12/05/15 0625 12/06/15 0448  Weight: 215 lb 9.6 oz (97.796 kg) 211 lb (95.709 kg) 210 lb 1.6 oz (95.301 kg)    Intake/Output:   Intake/Output Summary (Last 24 hours) at 12/06/15 0818 Last data filed at 12/06/15 0600  Gross per 24 hour  Intake    960 ml  Output   1050 ml  Net    -90 ml     Physical Exam: General:  Elderly and chronically ill appearing. HEENT: normal Neck: supple. JVP 8-9+ cm. Carotids 2+ bilat; no bruits. No thyromegaly or nodulenoted. Cor: PMI nondisplaced. Regular, slightly brady. No M/G/R appreciated. Lungs: slight crackles in bases, otherwise clear Abdomen: Obese, soft, NT, mildly distended, no HSM. No bruits or masses. +BS    Extremities: no cyanosis, clubbing, or rash.  Trace - 1+ LE  Pretibial edema  Trace edema in bilateral elbows. Neuro: alert & orientedx3, cranial nerves grossly intact. moves all 4 extremities w/o difficulty. Affect normal  Telemetry: reviewed personally, NSR 60s  Labs: CBC  Recent Labs  12/03/15 1052 12/04/15 0420  WBC 5.1 5.4  NEUTROABS 2.2  --   HGB 8.3* 7.9*  HCT 27.0* 24.9*  MCV 103.8* 102.5*  PLT 57* 54*   Basic Metabolic Panel  Recent Labs  12/05/15 0445 12/06/15 0400  NA 139 142  K 3.2* 3.8  CL 96* 98*  CO2 33* 33*  GLUCOSE 109* 125*  BUN 31* 30*  CREATININE 1.21 1.32*  CALCIUM 8.4* 8.7*  MG 1.8  --    Liver Function Tests  Recent Labs  12/03/15 1052  AST 24  ALT 18  ALKPHOS 55  BILITOT 0.3  PROT 6.1*  ALBUMIN 3.3*   No results for input(s): LIPASE, AMYLASE in the last 72 hours. Cardiac Enzymes  Recent Labs  12/03/15 1052  TROPONINI 0.03    BNP: BNP (last 3 results)  Recent Labs  09/20/15 1938 11/04/15 1607 12/03/15 0130  BNP 443.0* 162.6* 240.6*    ProBNP (last 3 results) No results for input(s): PROBNP in the last 8760 hours.   D-Dimer No results for input(s): DDIMER in the last 72 hours.  Hemoglobin A1C No results for input(s): HGBA1C in the last 72 hours. Fasting Lipid Panel No results for input(s): CHOL, HDL, LDLCALC, TRIG, CHOLHDL, LDLDIRECT in the last 72 hours. Thyroid Function Tests No results for input(s): TSH, T4TOTAL, T3FREE, THYROIDAB in the last 72 hours.  Invalid input(s): FREET3  Other results:     Imaging/Studies:  No results found.  Latest Echo  Latest Cath   Medications:     Scheduled Medications: . atorvastatin  10 mg Oral q1800  . azelastine  1 spray Each Nare QHS  . brimonidine  1 drop Left Eye BID  . carvedilol  6.25 mg Oral BID WC  . diazepam  1 mg Oral QHS  . dorzolamide  1 drop Left Eye BID  . dutasteride  0.5 mg Oral Daily  . ferrous sulfate  325 mg Oral Q breakfast  .  fluticasone  2 spray Each Nare Daily  . furosemide  80 mg Intravenous BID  . hydrALAZINE  12.5 mg Oral 3 times per day  . levothyroxine  50 mcg Oral QAC breakfast  . loratadine  10 mg Oral Daily  . pantoprazole  40 mg Oral Daily  . potassium chloride  20 mEq Oral Daily  . sodium chloride flush  3 mL Intravenous Q12H    Infusions:    PRN Medications: acetaminophen **OR** acetaminophen, dextromethorphan-guaiFENesin, diclofenac sodium, ondansetron **OR** ondansetron (ZOFRAN) IV, sodium chloride, traMADol   Assessment/Plan   Mr. Bonifas is a 82 yo male with chronic diastolic heart failure, non-obstructive CAD, hypertension and hyperlipidemia here with acute on chronic diastolic heart failure in the setting of medication changes. CHF asked to take over having follow family in the past.   1. Acute on chronic diastolic HF - Echo [id number] LVEF 55-60% with grade 1 DD - Volume status improved, only remains mildly elevated. NYHA class III symptoms.  - With increasing creatinine, will start back po lasix tonight at 40 mg BID. (received dose of 80 IV this am.) - on Chronic 02 at home.  - PT recommends HHPT with 24 hr supervision. Needs to continue working with PT.  2. HTN - Had been improving but up in 150s again this am. (140s last night.) - Increase hydralazine 25 mg TID today. 3. Non-obstructive CAD - continue statin 4. CKD - Trending up slightly. Transitioning to po meds. Follow closely with diuresis 5. HLD - Continue lipitor 10 mg daily.   Dispo- may benefit from 1-2 days watch on po meds. Home with previous [place] on d/c.   Length of Stay: 3  Shirley Friar PA-C 12/06/2015, 8:18 AM  Advanced Heart Failure Team Pager [phone] (M-F; 7a - 4p)  Please contact [facility] for night-coverage after hours (4p -7a ) and weekends on [email or web address]  Patient seen and examined with Oda Kilts, PA-C. We discussed all aspects of the encounter. I agree with the assessment and plan as stated  above.   Creatinine up a bit but still appears to have volume overload (JVP hard to see but with distended abdomen and 1+ pre-tibial edema). Will push a little harder to get him dry in house and avoid readmission. Give 80 iv lasix this afternoon with metolazone 5mg  x 1. Tomorrow will switch to demadex 40 daily (was on lasix 40/20) and see how he does. Watch renal function and electrolytes closely. Will need to address code status with him and his family.   Bensimhon, Daniel,MD 1:18 PM

## 2015-12-06 NOTE — Progress Notes (Signed)
[facility]                                                                              Patient Demographics  Victor Green, is a 82 y.o. male, DOB - [birth year], [id number]  Admit date - 12/03/2015   Admitting Physician Rise Patience, MD  Outpatient Primary MD for the patient is Velna Hatchet, MD  LOS - 3   No chief complaint on file.      Brief HPI  Per admit note by Dr. Hal Hope on 1/31 Victor Green is a 82 y.o. male with history of diastolic CHF last EF measured in November 2016 was 55-60% with grade 1 diastolic dysfunction presents with the ER because of increasing lower extremity edema. As per patient's caregiver patient's Lasix dose was decreased by half last week. Patient's blood pressure medicines also was decreased. Over the last few days patient was found to have increasing lower extremity edema extending up to the abdomen. Patient denies any chest pain or shortness of breath. On exam patient has significant lower extremity edema extending up all the way to the abdomen. Chest x-ray shows mild congestion. Patient was admitted for decompensated CHF    Assessment & Plan    Principal Problem:  Acute on chronic diastolic CHF (congestive heart failure), NYHA class 1  - Echo and 11/16 reveals an EF of 55-60% and grade 1 diastolic dysfunction - CHF team following, continue IV diuresis, on Lasix 80 mg IV twice a day - Negative balance of 2.9 L, continue daily weights   Active Problems: Severe pedal edema -Likely secondary to venous stasis and sitting in a chair and keeping legs in dependent position all day - Continue IV diuresis, physical therapy - Already wears TED stockings   Essential hypertension - Per caregiver, hydralazine was recently stopped. Reviewed cardiology recommendations, restarted hydralazine, continue Coreg  - PT evaluation recommended home health PT, supervision 24 7   Thrombocytopenia, unspecified - chronic -  avoid anticoagulation for DVT- SCDs only  Chronic kidney disease stage III - Follow creatinine closely with diuresis  Nonobstructive coronary artery disease - Continue Coreg, statin  Hyperlipidemia - Continue Lipitor   Code Status: Full code  Family Communication: Discussed in detail with the patient, all imaging results, lab results explained to the patient and caregiver    Disposition Plan:  Time Spent in minutes  25 minutes  Procedures  None  Consults   Cardiology  DVT Prophylaxis  SCD's  Medications  Scheduled Meds: . atorvastatin  10 mg Oral q1800  . azelastine  1 spray Each Nare QHS  . brimonidine  1 drop Left Eye BID  . carvedilol  6.25 mg Oral BID WC  . diazepam  1 mg Oral QHS  . dorzolamide  1 drop Left Eye BID  . dutasteride  0.5 mg Oral Daily  . ferrous sulfate  325 mg Oral Q breakfast  . fluticasone  2 spray Each Nare Daily  . furosemide  80 mg Intravenous Once  . hydrALAZINE  25 mg Oral 3 times per day  . levothyroxine  50 mcg Oral QAC breakfast  . loratadine  10  mg Oral Daily  . metolazone  2.5 mg Oral Once  . pantoprazole  40 mg Oral Daily  . potassium chloride  20 mEq Oral Daily  . sodium chloride flush  3 mL Intravenous Q12H  . [START ON 12/07/2015] torsemide  40 mg Oral Daily   Continuous Infusions:  PRN Meds:.acetaminophen **OR** acetaminophen, dextromethorphan-guaiFENesin, diclofenac sodium, ondansetron **OR** ondansetron (ZOFRAN) IV, sodium chloride, traMADol   Antibiotics   Anti-infectives    None        Subjective:   Victor Green was seen and examined today. No complaints. Patient denies chest pain, abdominal pain, N/V/D/C, new weakness, numbess, tingling. No acute events overnight.    Objective:   Blood pressure 114/57, pulse 74, temperature 98.7 F (37.1 C), temperature source Oral, resp. rate 18, height 5\' 10"  (1.778 m), weight 95.301 kg (210 lb 1.6 oz), SpO2 97 %.  Wt Readings from Last 3 Encounters:  12/06/15 95.301  kg (210 lb 1.6 oz)  11/04/15 93.441 kg (206 lb)  09/22/15 93.441 kg (206 lb)     Intake/Output Summary (Last 24 hours) at 12/06/15 1416 Last data filed at 12/06/15 1405  Gross per 24 hour  Intake    843 ml  Output    400 ml  Net    443 ml    Exam  General: Alert and oriented x 3, NAD  HEENT:  PERRLA, EOMI  Neck: Supple, no JVD  CVS: S1 S2 clear, RRR  Respiratory: Decreased breath sounds at the bases  Abdomen: Soft, NT, ND, + bowel sounds  Ext: no cyanosis clubbing, 2+ edema  Neuro: no new changes  Skin: No rashes  Psych: Normal affect and demeanor, alert and oriented x3    Data Review   Micro Results No results found for this or any previous visit (from the past 240 hour(s)).  Radiology Reports Dg Chest 2 View  12/03/2015  CLINICAL DATA:  Shortness of breath tonight.  Volume overload. EXAM: CHEST  2 VIEW COMPARISON:  11/04/2015, 09/20/2015 FINDINGS: Cardiomegaly and tortuous thoracic aorta, unchanged from prior. Again seen eventration of right hemidiaphragm. Increased bandlike opacity at the right lung base. Mild vascular congestion. No pleural effusion or pneumothorax. Bones are under mineralized. IMPRESSION: Mild vascular congestion.  Increased right basilar atelectasis. Electronically Signed   By: Jeb Levering M.D.   On: 12/03/2015 01:48    CBC  Recent Labs Lab 12/03/15 0130 12/03/15 1052 12/04/15 0420 12/06/15 0836  WBC 5.2 5.1 5.4 7.0  HGB 8.7* 8.3* 7.9* 8.4*  HCT 28.5* 27.0* 24.9* 26.4*  PLT 53* 57* 54* 49*  MCV 104.8* 103.8* 102.5* 102.3*  MCH 32.0 31.9 32.5 32.6  MCHC 30.5 30.7 31.7 31.8  RDW 18.5* 18.5* 18.4* 18.5*  LYMPHSABS 2.4 2.2  --   --   MONOABS 0.6 0.6  --   --   EOSABS 0.1 0.1  --   --   BASOSABS 0.0 0.0  --   --     Chemistries   Recent Labs Lab 12/03/15 0130 12/03/15 1052 12/04/15 0420 12/05/15 0445 12/06/15 0400  NA 141 141 140 139 142  K 4.2 3.5 3.7 3.2* 3.8  CL 98* 103 99* 96* 98*  CO2 31 32 29 33* 33*   GLUCOSE 124* 102* 91 109* 125*  BUN 34* 33* 32* 31* 30*  CREATININE 1.21 1.18 1.15 1.21 1.32*  CALCIUM 8.9 8.8* 8.6* 8.4* 8.7*  MG  --   --   --  1.8  --   AST  --  24  --   --   --   ALT  --  18  --   --   --   ALKPHOS  --  55  --   --   --   BILITOT  --  0.3  --   --   --    ------------------------------------------------------------------------------------------------------------------ estimated creatinine clearance is 40.5 mL/min (by C-G formula based on Cr of 1.32). ------------------------------------------------------------------------------------------------------------------ No results for input(s): HGBA1C in the last 72 hours. ------------------------------------------------------------------------------------------------------------------ No results for input(s): CHOL, HDL, LDLCALC, TRIG, CHOLHDL, LDLDIRECT in the last 72 hours. ------------------------------------------------------------------------------------------------------------------ No results for input(s): TSH, T4TOTAL, T3FREE, THYROIDAB in the last 72 hours.  Invalid input(s): FREET3 ------------------------------------------------------------------------------------------------------------------  Recent Labs  12/04/15 0929  VITAMINB12 1468*  FOLATE 48.6  FERRITIN 355*  TIBC 252  IRON 81  RETICCTPCT 3.4*    Coagulation profile No results for input(s): INR, PROTIME in the last 168 hours.  No results for input(s): DDIMER in the last 72 hours.  Cardiac Enzymes  Recent Labs Lab 12/03/15 1052  TROPONINI 0.03   ------------------------------------------------------------------------------------------------------------------ Invalid input(s): POCBNP  No results for input(s): GLUCAP in the last 72 hours.   RAI,RIPUDEEP M.D. [facility] 12/06/2015, 2:15 PM  Pager: [phone] Between 7am to 7pm - call Pager - [phone]  After 7pm go to [email or web address] - password [email or web address]  Call night coverage  person covering after 7pm

## 2015-12-06 NOTE — Care Management Important Message (Signed)
Important Message  Patient Details  Name: Victor Green MRN: [id number] Date of Birth: 10/10/1920   Medicare Important Message Given:  Yes    Alyla Pietila P Makyah Lavigne 12/06/2015, 4:07 PM

## 2015-12-07 LAB — ABO/RH: ABO/RH(D): O POS

## 2015-12-07 LAB — CBC
HCT: 23.3 % — ABNORMAL LOW (ref 39.0–52.0)
HEMOGLOBIN: 7.5 g/dL — AB (ref 13.0–17.0)
MCH: 32.9 pg (ref 26.0–34.0)
MCHC: 32.2 g/dL (ref 30.0–36.0)
MCV: 102.2 fL — ABNORMAL HIGH (ref 78.0–100.0)
PLATELETS: 44 10*3/uL — AB (ref 150–400)
RBC: 2.28 MIL/uL — AB (ref 4.22–5.81)
RDW: 18.5 % — ABNORMAL HIGH (ref 11.5–15.5)
WBC: 6.7 10*3/uL (ref 4.0–10.5)

## 2015-12-07 LAB — BASIC METABOLIC PANEL
ANION GAP: 15 (ref 5–15)
BUN: 35 mg/dL — ABNORMAL HIGH (ref 6–20)
CALCIUM: 8.7 mg/dL — AB (ref 8.9–10.3)
CO2: 30 mmol/L (ref 22–32)
CREATININE: 1.42 mg/dL — AB (ref 0.61–1.24)
Chloride: 95 mmol/L — ABNORMAL LOW (ref 101–111)
GFR, EST AFRICAN AMERICAN: 48 mL/min — AB (ref >60)
GFR, EST NON AFRICAN AMERICAN: 41 mL/min — AB (ref >60)
Glucose, Bld: 100 mg/dL — ABNORMAL HIGH (ref 65–99)
Potassium: 3.2 mmol/L — ABNORMAL LOW (ref 3.5–5.1)
Sodium: 140 mmol/L (ref 135–145)

## 2015-12-07 LAB — HEMOGLOBIN AND HEMATOCRIT, BLOOD
HEMATOCRIT: 27 % — AB (ref 39.0–52.0)
Hemoglobin: 8.8 g/dL — ABNORMAL LOW (ref 13.0–17.0)

## 2015-12-07 LAB — PREPARE RBC (CROSSMATCH)

## 2015-12-07 MED ORDER — POTASSIUM CHLORIDE CRYS ER 20 MEQ PO TBCR
40.0000 meq | EXTENDED_RELEASE_TABLET | Freq: Once | ORAL | Status: AC
  Administered 2015-12-07: 40 meq via ORAL
  Filled 2015-12-07: qty 2

## 2015-12-07 MED ORDER — FUROSEMIDE 10 MG/ML IJ SOLN
20.0000 mg | Freq: Once | INTRAMUSCULAR | Status: AC
  Administered 2015-12-07: 20 mg via INTRAVENOUS
  Filled 2015-12-07: qty 2

## 2015-12-07 MED ORDER — SODIUM CHLORIDE 0.9 % IV SOLN
Freq: Once | INTRAVENOUS | Status: AC
  Administered 2015-12-07: 09:00:00 via INTRAVENOUS

## 2015-12-07 NOTE — Progress Notes (Signed)
[facility]                                                                              Patient Demographics  Victor Green, is a 82 y.o. male, DOB - Jun 20, 1920, [id number]  Admit date - 12/03/2015   Admitting Physician Rise Patience, MD  Outpatient Primary MD for the patient is Velna Hatchet, MD  LOS - 4   No chief complaint on file.      Brief HPI  Per admit note by Dr. Hal Hope on 1/31 Victor Green is a 82 y.o. male with history of diastolic CHF last EF measured in November 2016 was 55-60% with grade 1 diastolic dysfunction presents with the ER because of increasing lower extremity edema. As per patient's caregiver patient's Lasix dose was decreased by half last week. Patient's blood pressure medicines also was decreased. Over the last few days patient was found to have increasing lower extremity edema extending up to the abdomen. Patient denies any chest pain or shortness of breath. On exam patient has significant lower extremity edema extending up all the way to the abdomen. Chest x-ray shows mild congestion. Patient was admitted for decompensated CHF    Assessment & Plan    Principal Problem:  Acute on chronic diastolic CHF (congestive heart failure), NYHA class 1  - Echo and 11/16 reveals an EF of 55-60% and grade 1 diastolic dysfunction - CHF team following, IV lasix transitioned to oral torsemide. - Negative balance of 4.7 L, continue daily weights - will give 1 extra dose of Lasix today after packed RBC transfusion   Active Problems: Severe pedal edema -Likely secondary to venous stasis and sitting in a chair and keeping legs in dependent position all day - Continue IV diuresis, physical therapy - Already wears TED stockings   Essential hypertension - Per caregiver, hydralazine was recently stopped. Reviewed cardiology recommendations, restarted hydralazine, continue Coreg  - PT evaluation recommended home health PT,  supervision 24 7   Thrombocytopenia, unspecified - chronic - avoid anticoagulation for DVT- SCDs only  Chronic kidney disease stage III - Follow creatinine closely with diuresis  Nonobstructive coronary artery disease - Continue Coreg, statin  Hyperlipidemia - Continue Lipitor  Anemia:  - Likely due to anemia of chronic disease, chronic kidney disease - Hb 7.5, transfuse 1 unit pRBC   Code Status: Full code  Family Communication: Discussed in detail with the patient, all imaging results, lab results explained to the patient and caregiver    Disposition Plan:  Time Spent in minutes  25 minutes  Procedures  None  Consults   Cardiology  DVT Prophylaxis  SCD's  Medications  Scheduled Meds: . sodium chloride   Intravenous Once  . atorvastatin  10 mg Oral q1800  . azelastine  1 spray Each Nare QHS  . brimonidine  1 drop Left Eye BID  . carvedilol  6.25 mg Oral BID WC  . diazepam  1 mg Oral QHS  . dorzolamide  1 drop Left Eye BID  . dutasteride  0.5 mg Oral Daily  . ferrous sulfate  325 mg Oral Q breakfast  . fluticasone  2  spray Each Nare Daily  . hydrALAZINE  25 mg Oral 3 times per day  . levothyroxine  50 mcg Oral QAC breakfast  . loratadine  10 mg Oral Daily  . pantoprazole  40 mg Oral Daily  . potassium chloride  20 mEq Oral Daily  . sodium chloride flush  3 mL Intravenous Q12H  . torsemide  40 mg Oral Daily   Continuous Infusions:  PRN Meds:.acetaminophen **OR** acetaminophen, dextromethorphan-guaiFENesin, diclofenac sodium, ondansetron **OR** ondansetron (ZOFRAN) IV, sodium chloride, traMADol   Antibiotics   Anti-infectives    None        Subjective:   Victor Green was seen and examined today. No complaints. Care giver at bed side. Patient denies chest pain, abdominal pain, N/V/D/C, new weakness, numbess, tingling. No acute events overnight.    Objective:   Blood pressure 130/57, pulse 76, temperature 98.6 F (37 C), temperature source  Oral, resp. rate 18, height 5\' 10"  (1.778 m), weight 95.294 kg (210 lb 1.4 oz), SpO2 98 %.  Wt Readings from Last 3 Encounters:  12/07/15 95.294 kg (210 lb 1.4 oz)  11/04/15 93.441 kg (206 lb)  09/22/15 93.441 kg (206 lb)     Intake/Output Summary (Last 24 hours) at 12/07/15 1143 Last data filed at 12/07/15 1010  Gross per 24 hour  Intake   1059 ml  Output   2775 ml  Net  -1716 ml    Exam  General: Alert and oriented x 3, NAD  HEENT:  PERRLA, EOMI  Neck: Supple, no JVD  CVS: S1 S2 clear, RRR  Respiratory: Decreased breath sounds at the bases  Abdomen: Soft, NT, ND, + bowel sounds  Ext: no cyanosis clubbing, 2+ edema  Neuro: no new changes  Skin: No rashes  Psych: Normal affect and demeanor, alert and oriented x3    Data Review   Micro Results No results found for this or any previous visit (from the past 240 hour(s)).  Radiology Reports Dg Chest 2 View  12/03/2015  CLINICAL DATA:  Shortness of breath tonight.  Volume overload. EXAM: CHEST  2 VIEW COMPARISON:  11/04/2015, 09/20/2015 FINDINGS: Cardiomegaly and tortuous thoracic aorta, unchanged from prior. Again seen eventration of right hemidiaphragm. Increased bandlike opacity at the right lung base. Mild vascular congestion. No pleural effusion or pneumothorax. Bones are under mineralized. IMPRESSION: Mild vascular congestion.  Increased right basilar atelectasis. Electronically Signed   By: Jeb Levering M.D.   On: 12/03/2015 01:48    CBC  Recent Labs Lab 12/03/15 0130 12/03/15 1052 12/04/15 0420 12/06/15 0836 12/07/15 0307  WBC 5.2 5.1 5.4 7.0 6.7  HGB 8.7* 8.3* 7.9* 8.4* 7.5*  HCT 28.5* 27.0* 24.9* 26.4* 23.3*  PLT 53* 57* 54* 49* 44*  MCV 104.8* 103.8* 102.5* 102.3* 102.2*  MCH 32.0 31.9 32.5 32.6 32.9  MCHC 30.5 30.7 31.7 31.8 32.2  RDW 18.5* 18.5* 18.4* 18.5* 18.5*  LYMPHSABS 2.4 2.2  --   --   --   MONOABS 0.6 0.6  --   --   --   EOSABS 0.1 0.1  --   --   --   BASOSABS 0.0 0.0  --   --    --     Chemistries   Recent Labs Lab 12/03/15 1052 12/04/15 0420 12/05/15 0445 12/06/15 0400 12/07/15 0307  NA 141 140 139 142 140  K 3.5 3.7 3.2* 3.8 3.2*  CL 103 99* 96* 98* 95*  CO2 32 29 33* 33* 30  GLUCOSE 102* 91 109*  125* 100*  BUN 33* 32* 31* 30* 35*  CREATININE 1.18 1.15 1.21 1.32* 1.42*  CALCIUM 8.8* 8.6* 8.4* 8.7* 8.7*  MG  --   --  1.8  --   --   AST 24  --   --   --   --   ALT 18  --   --   --   --   ALKPHOS 55  --   --   --   --   BILITOT 0.3  --   --   --   --    ------------------------------------------------------------------------------------------------------------------ estimated creatinine clearance is 37.6 mL/min (by C-G formula based on Cr of 1.42). ------------------------------------------------------------------------------------------------------------------ No results for input(s): HGBA1C in the last 72 hours. ------------------------------------------------------------------------------------------------------------------ No results for input(s): CHOL, HDL, LDLCALC, TRIG, CHOLHDL, LDLDIRECT in the last 72 hours. ------------------------------------------------------------------------------------------------------------------ No results for input(s): TSH, T4TOTAL, T3FREE, THYROIDAB in the last 72 hours.  Invalid input(s): FREET3 ------------------------------------------------------------------------------------------------------------------ No results for input(s): VITAMINB12, FOLATE, FERRITIN, TIBC, IRON, RETICCTPCT in the last 72 hours.  Coagulation profile No results for input(s): INR, PROTIME in the last 168 hours.  No results for input(s): DDIMER in the last 72 hours.  Cardiac Enzymes  Recent Labs Lab 12/03/15 1052  TROPONINI 0.03   ------------------------------------------------------------------------------------------------------------------ Invalid input(s): POCBNP  No results for input(s): GLUCAP in the last 72  hours.   Leather Estis M.D. [facility] 12/07/2015, 11:43 AM  Pager: [phone] Between 7am to 7pm - call Pager - [phone]  After 7pm go to [email or web address] - password [email or web address]  Call night coverage person covering after 7pm

## 2015-12-08 LAB — CBC
HEMATOCRIT: 26.2 % — AB (ref 39.0–52.0)
Hemoglobin: 8.6 g/dL — ABNORMAL LOW (ref 13.0–17.0)
MCH: 32.5 pg (ref 26.0–34.0)
MCHC: 32.8 g/dL (ref 30.0–36.0)
MCV: 98.9 fL (ref 78.0–100.0)
PLATELETS: 43 10*3/uL — AB (ref 150–400)
RBC: 2.65 MIL/uL — ABNORMAL LOW (ref 4.22–5.81)
RDW: 19.1 % — AB (ref 11.5–15.5)
WBC: 7.1 10*3/uL (ref 4.0–10.5)

## 2015-12-08 LAB — TYPE AND SCREEN
ABO/RH(D): O POS
ANTIBODY SCREEN: NEGATIVE
UNIT DIVISION: 0

## 2015-12-08 LAB — BASIC METABOLIC PANEL
Anion gap: 11 (ref 5–15)
BUN: 43 mg/dL — AB (ref 6–20)
CALCIUM: 8.5 mg/dL — AB (ref 8.9–10.3)
CO2: 37 mmol/L — ABNORMAL HIGH (ref 22–32)
CREATININE: 1.5 mg/dL — AB (ref 0.61–1.24)
Chloride: 95 mmol/L — ABNORMAL LOW (ref 101–111)
GFR calc Af Amer: 44 mL/min — ABNORMAL LOW (ref >60)
GFR, EST NON AFRICAN AMERICAN: 38 mL/min — AB (ref >60)
GLUCOSE: 101 mg/dL — AB (ref 65–99)
Potassium: 3.2 mmol/L — ABNORMAL LOW (ref 3.5–5.1)
SODIUM: 143 mmol/L (ref 135–145)

## 2015-12-08 MED ORDER — POTASSIUM CHLORIDE CRYS ER 20 MEQ PO TBCR
40.0000 meq | EXTENDED_RELEASE_TABLET | Freq: Once | ORAL | Status: AC
  Administered 2015-12-08: 40 meq via ORAL
  Filled 2015-12-08: qty 2

## 2015-12-08 NOTE — Progress Notes (Signed)
[facility]                                                                              Patient Demographics  Victor Green, is a 82 y.o. male, DOB - 1920-01-05, [id number]  Admit date - 12/03/2015   Admitting Physician Rise Patience, MD  Outpatient Primary MD for the patient is Velna Hatchet, MD  LOS - 5   No chief complaint on file.      Brief HPI  Per admit note by Dr. Hal Hope on 1/31 Victor Green is a 82 y.o. male with history of diastolic CHF last EF measured in November 2016 was 55-60% with grade 1 diastolic dysfunction presents with the ER because of increasing lower extremity edema. As per patient's caregiver patient's Lasix dose was decreased by half last week. Patient's blood pressure medicines also was decreased. Over the last few days patient was found to have increasing lower extremity edema extending up to the abdomen. Patient denies any chest pain or shortness of breath. On exam patient has significant lower extremity edema extending up all the way to the abdomen. Chest x-ray shows mild congestion. Patient was admitted for decompensated CHF    Assessment & Plan    Principal Problem:  Acute on chronic diastolic CHF (congestive heart failure), NYHA class 1  - Echo and 11/16 reveals an EF of 55-60% and grade 1 diastolic dysfunction - CHF team following, IV lasix transitioned to oral torsemide. - Negative balance of 6.0 L, continue daily weights - Given extra dose of Lasix after packed RBC transfusion on 2/4, creatinine 1.5 trending up today   Active Problems: Severe pedal edema -Likely secondary to venous stasis and sitting in a chair and keeping legs in dependent position all day -Continue diuresis   Essential hypertension - Per caregiver, hydralazine was recently stopped. Reviewed cardiology recommendations, restarted hydralazine, continue Coreg  - PT evaluation recommended home health PT, supervision 24 7    Thrombocytopenia, unspecified - chronic - avoid anticoagulation for DVT- SCDs only  Chronic kidney disease stage III - Follow creatinine closely with diuresis  Nonobstructive coronary artery disease - Continue Coreg, statin  Hyperlipidemia - Continue Lipitor  Anemia:  - Likely due to anemia of chronic disease, chronic kidney disease - Hb 7.5 on 2/4, transfused 1 unit, hemoglobin 8.6 today   Code Status: Full code  Family Communication: Discussed in detail with the patient, all imaging results, lab results explained to the patient and caregiver    Disposition Plan: Hopefully DC home tomorrow, if okay with cardiology  Time Spent in minutes  25 minutes  Procedures  None  Consults   Cardiology  DVT Prophylaxis  SCD's  Medications  Scheduled Meds: . atorvastatin  10 mg Oral q1800  . azelastine  1 spray Each Nare QHS  . brimonidine  1 drop Left Eye BID  . carvedilol  6.25 mg Oral BID WC  . diazepam  1 mg Oral QHS  . dorzolamide  1 drop Left Eye BID  . dutasteride  0.5 mg Oral Daily  . ferrous sulfate  325 mg Oral Q breakfast  . fluticasone  2 spray  Each Nare Daily  . hydrALAZINE  25 mg Oral 3 times per day  . levothyroxine  50 mcg Oral QAC breakfast  . loratadine  10 mg Oral Daily  . pantoprazole  40 mg Oral Daily  . potassium chloride  20 mEq Oral Daily  . sodium chloride flush  3 mL Intravenous Q12H  . torsemide  40 mg Oral Daily   Continuous Infusions:  PRN Meds:.acetaminophen **OR** acetaminophen, dextromethorphan-guaiFENesin, diclofenac sodium, ondansetron **OR** ondansetron (ZOFRAN) IV, sodium chloride, traMADol   Antibiotics   Anti-infectives    None        Subjective:   Victor Green was seen and examined today. No complaints. Overall improving. Care giver at bed side. Patient denies chest pain, abdominal pain, N/V/D/C, new weakness, numbess, tingling. No acute events overnight.    Objective:   Blood pressure 115/49, pulse 59, temperature  98.1 F (36.7 C), temperature source Oral, resp. rate 16, height 5\' 10"  (1.778 m), weight 90.583 kg (199 lb 11.2 oz), SpO2 96 %.  Wt Readings from Last 3 Encounters:  12/08/15 90.583 kg (199 lb 11.2 oz)  11/04/15 93.441 kg (206 lb)  09/22/15 93.441 kg (206 lb)     Intake/Output Summary (Last 24 hours) at 12/08/15 1121 Last data filed at 12/08/15 0600  Gross per 24 hour  Intake   1535 ml  Output   2800 ml  Net  -1265 ml    Exam  General: Alert and oriented x 3, NAD  HEENT:  PERRLA, EOMI  Neck: Supple, no JVD  CVS: S1 S2 clear, RRR  Respiratory: CTA B  Abdomen: Soft, NT, ND, + bowel sounds  Ext: no cyanosis clubbing, 1+ edema  Neuro: no new changes  Skin: No rashes  Psych: Normal affect and demeanor, alert and oriented x3    Data Review   Micro Results No results found for this or any previous visit (from the past 240 hour(s)).  Radiology Reports Dg Chest 2 View  12/03/2015  CLINICAL DATA:  Shortness of breath tonight.  Volume overload. EXAM: CHEST  2 VIEW COMPARISON:  11/04/2015, 09/20/2015 FINDINGS: Cardiomegaly and tortuous thoracic aorta, unchanged from prior. Again seen eventration of right hemidiaphragm. Increased bandlike opacity at the right lung base. Mild vascular congestion. No pleural effusion or pneumothorax. Bones are under mineralized. IMPRESSION: Mild vascular congestion.  Increased right basilar atelectasis. Electronically Signed   By: Jeb Levering M.D.   On: 12/03/2015 01:48    CBC  Recent Labs Lab 12/03/15 0130 12/03/15 1052 12/04/15 0420 12/06/15 0836 12/07/15 0307 12/07/15 1616 12/08/15 0326  WBC 5.2 5.1 5.4 7.0 6.7  --  7.1  HGB 8.7* 8.3* 7.9* 8.4* 7.5* 8.8* 8.6*  HCT 28.5* 27.0* 24.9* 26.4* 23.3* 27.0* 26.2*  PLT 53* 57* 54* 49* 44*  --  43*  MCV 104.8* 103.8* 102.5* 102.3* 102.2*  --  98.9  MCH 32.0 31.9 32.5 32.6 32.9  --  32.5  MCHC 30.5 30.7 31.7 31.8 32.2  --  32.8  RDW 18.5* 18.5* 18.4* 18.5* 18.5*  --  19.1*   LYMPHSABS 2.4 2.2  --   --   --   --   --   MONOABS 0.6 0.6  --   --   --   --   --   EOSABS 0.1 0.1  --   --   --   --   --   BASOSABS 0.0 0.0  --   --   --   --   --  Chemistries   Recent Labs Lab 12/03/15 1052 12/04/15 0420 12/05/15 0445 12/06/15 0400 12/07/15 0307 12/08/15 0326  NA 141 140 139 142 140 143  K 3.5 3.7 3.2* 3.8 3.2* 3.2*  CL 103 99* 96* 98* 95* 95*  CO2 32 29 33* 33* 30 37*  GLUCOSE 102* 91 109* 125* 100* 101*  BUN 33* 32* 31* 30* 35* 43*  CREATININE 1.18 1.15 1.21 1.32* 1.42* 1.50*  CALCIUM 8.8* 8.6* 8.4* 8.7* 8.7* 8.5*  MG  --   --  1.8  --   --   --   AST 24  --   --   --   --   --   ALT 18  --   --   --   --   --   ALKPHOS 55  --   --   --   --   --   BILITOT 0.3  --   --   --   --   --    ------------------------------------------------------------------------------------------------------------------ estimated creatinine clearance is 34.8 mL/min (by C-G formula based on Cr of 1.5). ------------------------------------------------------------------------------------------------------------------ No results for input(s): HGBA1C in the last 72 hours. ------------------------------------------------------------------------------------------------------------------ No results for input(s): CHOL, HDL, LDLCALC, TRIG, CHOLHDL, LDLDIRECT in the last 72 hours. ------------------------------------------------------------------------------------------------------------------ No results for input(s): TSH, T4TOTAL, T3FREE, THYROIDAB in the last 72 hours.  Invalid input(s): FREET3 ------------------------------------------------------------------------------------------------------------------ No results for input(s): VITAMINB12, FOLATE, FERRITIN, TIBC, IRON, RETICCTPCT in the last 72 hours.  Coagulation profile No results for input(s): INR, PROTIME in the last 168 hours.  No results for input(s): DDIMER in the last 72 hours.  Cardiac Enzymes  Recent  Labs Lab 12/03/15 1052  TROPONINI 0.03   ------------------------------------------------------------------------------------------------------------------ Invalid input(s): POCBNP  No results for input(s): GLUCAP in the last 72 hours.   RAI,RIPUDEEP M.D. [facility] 12/08/2015, 11:21 AM  Pager: [phone] Between 7am to 7pm - call Pager - [phone]  After 7pm go to [email or web address] - password [email or web address]  Call night coverage person covering after 7pm

## 2015-12-09 LAB — BASIC METABOLIC PANEL
Anion gap: 10 (ref 5–15)
BUN: 42 mg/dL — AB (ref 6–20)
CALCIUM: 8.5 mg/dL — AB (ref 8.9–10.3)
CO2: 38 mmol/L — AB (ref 22–32)
CREATININE: 1.4 mg/dL — AB (ref 0.61–1.24)
Chloride: 92 mmol/L — ABNORMAL LOW (ref 101–111)
GFR calc non Af Amer: 42 mL/min — ABNORMAL LOW (ref >60)
GFR, EST AFRICAN AMERICAN: 48 mL/min — AB (ref >60)
Glucose, Bld: 118 mg/dL — ABNORMAL HIGH (ref 65–99)
Potassium: 3 mmol/L — ABNORMAL LOW (ref 3.5–5.1)
Sodium: 140 mmol/L (ref 135–145)

## 2015-12-09 LAB — CBC
HCT: 26.2 % — ABNORMAL LOW (ref 39.0–52.0)
Hemoglobin: 8.6 g/dL — ABNORMAL LOW (ref 13.0–17.0)
MCH: 32.6 pg (ref 26.0–34.0)
MCHC: 32.8 g/dL (ref 30.0–36.0)
MCV: 99.2 fL (ref 78.0–100.0)
PLATELETS: 44 10*3/uL — AB (ref 150–400)
RBC: 2.64 MIL/uL — AB (ref 4.22–5.81)
RDW: 18.4 % — AB (ref 11.5–15.5)
WBC: 6.3 10*3/uL (ref 4.0–10.5)

## 2015-12-09 MED ORDER — POTASSIUM CHLORIDE ER 20 MEQ PO TBCR: 40.0000 meq | EXTENDED_RELEASE_TABLET | Freq: Every day | ORAL | Status: DC

## 2015-12-09 MED ORDER — POTASSIUM CHLORIDE CRYS ER 20 MEQ PO TBCR
40.0000 meq | EXTENDED_RELEASE_TABLET | ORAL | Status: AC
  Administered 2015-12-09 (×2): 40 meq via ORAL
  Filled 2015-12-09: qty 2

## 2015-12-09 MED ORDER — POTASSIUM CHLORIDE CRYS ER 20 MEQ PO TBCR
40.0000 meq | EXTENDED_RELEASE_TABLET | Freq: Once | ORAL | Status: DC
  Filled 2015-12-09: qty 2

## 2015-12-09 MED ORDER — TORSEMIDE 20 MG PO TABS: 40.0000 mg | ORAL_TABLET | Freq: Every day | ORAL | Status: DC

## 2015-12-09 MED ORDER — POTASSIUM CHLORIDE CRYS ER 20 MEQ PO TBCR: 40.0000 meq | EXTENDED_RELEASE_TABLET | Freq: Once | ORAL | Status: DC

## 2015-12-09 MED ORDER — HYDRALAZINE HCL 25 MG PO TABS: 25.0000 mg | ORAL_TABLET | Freq: Three times a day (TID) | ORAL | Status: AC

## 2015-12-09 NOTE — Care Management Important Message (Signed)
Important Message  Patient Details  Name: DONSHA GOLIN MRN: [id number] Date of Birth: [birth year]   Medicare Important Message Given:  Yes    Louanne Belton 12/09/2015, 12:31 [place] Message  Patient Details  Name: NETHAN LAMPHEAR MRN: [id number] Date of Birth: [birth year]   Medicare Important Message Given:  Yes    Jakarri Lesko G 12/09/2015, 12:30 PM

## 2015-12-09 NOTE — Progress Notes (Signed)
Patient taken out for discharge via wheelchair.  

## 2015-12-09 NOTE — Discharge Summary (Signed)
Physician Discharge Summary   Patient ID: Victor Green MRN: [id number] DOB/AGE: Nov 22, 1919 82 y.o.  Admit date: 12/03/2015 Discharge date: 12/09/2015  Primary Care Physician:  Velna Hatchet, MD  Discharge Diagnoses:    . Acute on chronic diastolic CHF (congestive heart failure), NYHA class 1 ([place]) . Thrombocytopenia, unspecified ([place]) . Essential hypertension  Consults: Cardiology, Dr Haroldine Laws   Recommendations for Outpatient Follow-up:  1. Home health PT OT 2. Please repeat BMET at next visit for potassium and renal function 3. Please note cardiology started patient on torsemide 40 mg daily. Lasix and Zaroxolyn discontinued.   DIET: Heart healthy diet with fluid restriction 1200 mL    Allergies:   Allergies  Allergen Reactions  . Other Anaphylaxis    Horse serum  . Tetanus Toxoids Other (See Comments)     DISCHARGE MEDICATIONS: Current Discharge Medication List    START taking these medications   Details  torsemide (DEMADEX) 20 MG tablet Take 2 tablets (40 mg total) by mouth daily. Qty: 60 tablet, Refills: 3      CONTINUE these medications which have CHANGED   Details  hydrALAZINE (APRESOLINE) 25 MG tablet Take 1 tablet (25 mg total) by mouth 3 (three) times daily. Qty: 90 tablet, Refills: 3    potassium chloride 20 MEQ TBCR Take 40 mEq by mouth daily. Qty: 60 tablet, Refills: 2      CONTINUE these medications which have NOT CHANGED   Details  acetaminophen (TYLENOL) 325 MG tablet Take 325 mg by mouth every morning.     atorvastatin (LIPITOR) 10 MG tablet Take 1 tablet by mouth  daily Qty: 90 tablet, Refills: 1    azelastine (ASTELIN) 0.1 % nasal spray Place 1 spray into both nostrils 2 (two) times daily. Use in each nostril as directed Qty: 30 mL, Refills: 3    B Complex-Biotin-FA (B-COMPLEX PO) Take 1 tablet by mouth daily.     brimonidine (ALPHAGAN) 0.15 % ophthalmic solution Place 1 drop into the left eye 2 (two) times daily.      carvedilol (COREG) 6.25 MG tablet Take 1 tablet (6.25 mg total) by mouth 2 (two) times daily with a meal. Qty: 60 tablet, Refills: 0    cloNIDine (CATAPRES) 0.1 MG tablet Take 0.1 mg by mouth 3 (three) times daily as needed (if blood pressure is over 180).    dextromethorphan-guaiFENesin (MUCINEX DM) 30-600 MG 12hr tablet Take 1 tablet by mouth 2 (two) times daily as needed for cough.    diazepam (VALIUM) 2 MG tablet Take 0.5 tablets (1 mg total) by mouth at bedtime. Qty: 90 tablet, Refills: 1    diclofenac sodium (VOLTAREN) 1 % GEL Apply 3 times per day Qty: 60 g, Refills: 1    dorzolamide (TRUSOPT) 2 % ophthalmic solution Place 1 drop into the left eye 2 (two) times daily.     dutasteride (AVODART) 0.5 MG capsule Take 1 capsule by mouth  daily Qty: 90 capsule, Refills: 1    EPIPEN 2-PAK 0.3 MG/0.3ML SOAJ injection Inject 0.59ml (0.3mg ) into  the muscle once as directed Qty: 2 Device, Refills: 0    ferrous sulfate 325 (65 FE) MG tablet Take 325 mg by mouth daily with breakfast.    fexofenadine (ALLEGRA) 180 MG tablet Take 90 mg by mouth every evening. 1/2 tablet once a day    fluticasone (FLONASE) 50 MCG/ACT nasal spray Use 2 sprays in each  nostril twice daily Qty: 64 g, Refills: 1    glucosamine-chondroitin 500-400 MG tablet  Take 1 tablet by mouth 2 (two) times daily.     guaiFENesin (MUCINEX) 600 MG 12 hr tablet Take 1 tablet (600 mg total) by mouth 2 (two) times daily as needed for to loosen phlegm. Qty: 20 tablet, Refills: 0    levothyroxine (SYNTHROID, LEVOTHROID) 50 MCG tablet Take 1 tablet by mouth  daily Qty: 90 tablet, Refills: 1    Multiple Vitamin (MULTIVITAMIN WITH MINERALS) TABS tablet Take 1 tablet by mouth daily.    pantoprazole (PROTONIX) 40 MG tablet Take 1 tablet by mouth  daily Qty: 90 tablet, Refills: 1    Probiotic Product (ALIGN) 4 MG CAPS Take 4 mg by mouth every evening.    traMADol (ULTRAM) 50 MG tablet Half to 1 tablet with food up to 3 times a  day for pain Qty: 30 tablet, Refills: 0      STOP taking these medications     furosemide (LASIX) 40 MG tablet      metolazone (ZAROXOLYN) 2.5 MG tablet          Brief H and P: For complete details please refer to admission H and P, but in brief Per admit note by Dr. Hal Hope on 1/31 Victor Green is a 82 y.o. male with history of diastolic CHF last EF measured in November 2016 was 55-60% with grade 1 diastolic dysfunction presents with the ER because of increasing lower extremity edema. As per patient's caregiver patient's Lasix dose was decreased by half last week. Patient's blood pressure medicines also was decreased. Over the last few days patient was found to have increasing lower extremity edema extending up to the abdomen. Patient denies any chest pain or shortness of breath. On exam patient has significant lower extremity edema extending up all the way to the abdomen. Chest x-ray shows mild congestion. Patient was admitted for decompensated CHF.  Hospital Course:    Acute on chronic diastolic CHF (congestive heart failure), NYHA class 1  - Echo on 11/16 reveals an EF of 55-60% and grade 1 diastolic dysfunction -Cardiology was consulted and patient was followed closely by CHF team. He was initially placed on IV Lasix for diuresis however he had maximal benefit with oral torsemide. - Negative balance of 6.4 L, weight down from 217lbs to 198lbs  - Given extra dose of Lasix after packed RBC transfusion on 2/4 - Creatinine 1.4 at the time of discharge. Patient also placed on potassium supplementation. Please check BMET at the time of follow-up appointment. - Patient was cleared by cardiology to be discharged home.  Severe pedal edema -Likely secondary to venous stasis and sitting in a chair and keeping legs in dependent position all day -Continue diuresis   Essential hypertension - Per caregiver, hydralazine was recently stopped. Reviewed cardiology recommendations,  restarted hydralazine, dose was increased to 25 mg 3 times a day. continue Coreg   Deconditioning - PT evaluation recommended home health PT, supervision 24 7   Thrombocytopenia, unspecified - chronic - avoid anticoagulation for DVT- SCDs only  Chronic kidney disease stage III - Currently stable, monitor renal function closely  Nonobstructive coronary artery disease - Continue Coreg, statin  Hyperlipidemia - Continue Lipitor  Anemia:  - Likely due to anemia of chronic disease, chronic kidney disease - Hb 7.5 on 2/4, transfused 1 unit, hemoglobin 8.6 today   Day of Discharge BP 139/55 mmHg  Pulse 58  Temp(Src) 99.3 F (37.4 C) (Oral)  Resp 18  Ht 5\' 10"  (1.778 m)  Wt 90.2 kg (198 lb 13.7  oz)  BMI 28.53 kg/m2  SpO2 98%  Physical Exam: General: Alert and awake oriented x3 not in any acute distress. HEENT: anicteric sclera, pupils reactive to light and accommodation CVS: S1-S2 clear no murmur rubs or gallops Chest: clear to auscultation bilaterally, no wheezing rales or rhonchi Abdomen: soft nontender, nondistended, normal bowel sounds Extremities: no cyanosis, clubbing, 1+ edema noted bilaterally    The results of significant diagnostics from this hospitalization (including imaging, microbiology, ancillary and laboratory) are listed below for reference.    LAB RESULTS: Basic Metabolic Panel:  Recent Labs Lab 12/05/15 0445  12/08/15 0326 12/09/15 0355  NA 139  < > 143 140  K 3.2*  < > 3.2* 3.0*  CL 96*  < > 95* 92*  CO2 33*  < > 37* 38*  GLUCOSE 109*  < > 101* 118*  BUN 31*  < > 43* 42*  CREATININE 1.21  < > 1.50* 1.40*  CALCIUM 8.4*  < > 8.5* 8.5*  MG 1.8  --   --   --   < > = values in this interval not displayed. Liver Function Tests:  Recent Labs Lab 12/03/15 1052  AST 24  ALT 18  ALKPHOS 55  BILITOT 0.3  PROT 6.1*  ALBUMIN 3.3*   No results for input(s): LIPASE, AMYLASE in the last 168 hours. No results for input(s): AMMONIA in the last  168 hours. CBC:  Recent Labs Lab 12/03/15 1052  12/08/15 0326 12/09/15 0355  WBC 5.1  < > 7.1 6.3  NEUTROABS 2.2  --   --   --   HGB 8.3*  < > 8.6* 8.6*  HCT 27.0*  < > 26.2* 26.2*  MCV 103.8*  < > 98.9 99.2  PLT 57*  < > 43* 44*  < > = values in this interval not displayed. Cardiac Enzymes:  Recent Labs Lab 12/03/15 1052  TROPONINI 0.03   BNP: Invalid input(s): POCBNP CBG: No results for input(s): GLUCAP in the last 168 hours.  Significant Diagnostic Studies:  Dg Chest 2 View  12/03/2015  CLINICAL DATA:  Shortness of breath tonight.  Volume overload. EXAM: CHEST  2 VIEW COMPARISON:  11/04/2015, 09/20/2015 FINDINGS: Cardiomegaly and tortuous thoracic aorta, unchanged from prior. Again seen eventration of right hemidiaphragm. Increased bandlike opacity at the right lung base. Mild vascular congestion. No pleural effusion or pneumothorax. Bones are under mineralized. IMPRESSION: Mild vascular congestion.  Increased right basilar atelectasis. Electronically Signed   By: Jeb Levering M.D.   On: 12/03/2015 01:48    2D ECHO:   Disposition and Follow-up: Discharge Instructions    ([place]) Call MD:  Anytime you have any of the following symptoms: 1) 3 pound weight gain in 24 hours or 5 pounds in 1 week 2) shortness of breath, with or without a dry hacking cough 3) swelling in the hands, feet or stomach 4) if you have to sleep on extra pillows at night in order to breathe.    Complete by:  As directed      Diet - low sodium heart healthy    Complete by:  As directed      Increase activity slowly    Complete by:  As directed             DISPOSITION: Home   DISCHARGE FOLLOW-UP Follow-up Information    Follow up with Glori Bickers, MD On 12/26/2015.   Specialty:  Cardiology   Why:  at 1020 for post hospital follow up.  Please bring all of your medications to your visit. The code for pt parking is 0001.   Contact information:   [place] [address] [phone]       Follow up with Velna Hatchet, MD. Schedule an appointment as soon as possible for a visit in 10 days.   Specialty:  Internal Medicine   Why:  for hospital follow-up, obtain labs BMET for potassium and renal function   Contact information:   [place] [address] [phone]        Time spent on Discharge: 68mins   Signed:   RAI,RIPUDEEP M.D. [facility] 12/09/2015, 11:42 AM Pager: [phone]

## 2015-12-09 NOTE — Progress Notes (Signed)
Tried scheduling hospital follow up appt with Dr. Ardeth Perfect, message left.

## 2015-12-09 NOTE — Progress Notes (Signed)
Advanced Heart Failure Rounding Note  Primary Physician [name], MD Primary Cardiologist Dr. Haroldine Laws (Last seen 2012)  Subjective:    Presented to ED with increasing pedal edema after lasix being cut in half 2/2 hypotension.  Working with PT. Very resistant to exercising. Afraid of falling, even with edge of bed exercises.   BP much improved. Weight now below previous perceived baseline. Out 6 L and down ~17 lbs from highest this admission.  Feels much better from admission. Still doesn't want to work with PT.  Creatinine mildly elevated from admission 1.32 -> 1.42 -> 1.50 -> 1.40. K 3.0 this am.   Echo 09/2015 LVEF 55-60% with Grade 1 DD, normal RV.  Objective:   Weight Range: 198 lb 13.7 oz (90.2 kg) Body mass index is 28.53 kg/(m^2).   Vital Signs:   Temp:  [97.4 F (36.3 C)-99.3 F (37.4 C)] 99.3 F (37.4 C) (02/06 0339) Pulse Rate:  [60-65] 60 (02/06 0339) Resp:  [16-18] 18 (02/06 0339) BP: (112-129)/(47-51) 112/50 mmHg (02/06 0339) SpO2:  [96 %-98 %] 98 % (02/06 0339) Weight:  [198 lb 13.7 oz (90.2 kg)] 198 lb 13.7 oz (90.2 kg) (02/06 0339) Last BM Date: 12/08/15  Weight change: Filed Weights   12/07/15 0452 12/08/15 0455 12/09/15 0339  Weight: 210 lb 1.4 oz (95.294 kg) 199 lb 11.2 oz (90.583 kg) 198 lb 13.7 oz (90.2 kg)    Intake/Output:   Intake/Output Summary (Last 24 hours) at 12/09/15 0751 Last data filed at 12/09/15 [id number]  Gross per 24 hour  Intake    960 ml  Output   1001 ml  Net    -41 ml     Physical Exam: General:  Elderly and chronically ill appearing. HEENT: normal Neck: supple. JVP difficult, but appears 6-7+ cm. Carotids 2+ bilat; no bruits. No thyromegaly or lymphadenopathy. Cor: PMI nondisplaced. Regular, slightly brady. No M/G/R appreciated. Lungs: CTAB, normal effort Abdomen: Obese, soft, NT, mildly distended, no HSM. No bruits or masses. +BS  Extremities: no cyanosis, clubbing, or rash.  No edema in elbows, trace ankle  edema.  Neuro: alert & orientedx3, cranial nerves grossly intact. moves all 4 extremities w/o difficulty. Affect normal  Telemetry: Reviewed personally, NSR 60s  Labs: CBC  Recent Labs  12/08/15 0326 12/09/15 0355  WBC 7.1 6.3  HGB 8.6* 8.6*  HCT 26.2* 26.2*  MCV 98.9 99.2  PLT 43* 44*   Basic Metabolic Panel  Recent Labs  12/08/15 0326 12/09/15 0355  NA 143 140  K 3.2* 3.0*  CL 95* 92*  CO2 37* 38*  GLUCOSE 101* 118*  BUN 43* 42*  CREATININE 1.50* 1.40*  CALCIUM 8.5* 8.5*   Liver Function Tests No results for input(s): AST, ALT, ALKPHOS, BILITOT, PROT, ALBUMIN in the last 72 hours. No results for input(s): LIPASE, AMYLASE in the last 72 hours. Cardiac Enzymes No results for input(s): CKTOTAL, CKMB, CKMBINDEX, TROPONINI in the last 72 hours.  BNP: BNP (last 3 results)  Recent Labs  09/20/15 1938 11/04/15 1607 12/03/15 0130  BNP 443.0* 162.6* 240.6*    ProBNP (last 3 results) No results for input(s): PROBNP in the last 8760 hours.   D-Dimer No results for input(s): DDIMER in the last 72 hours. Hemoglobin A1C No results for input(s): HGBA1C in the last 72 hours. Fasting Lipid Panel No results for input(s): CHOL, HDL, LDLCALC, TRIG, CHOLHDL, LDLDIRECT in the last 72 hours. Thyroid Function Tests No results for input(s): TSH, T4TOTAL, T3FREE, THYROIDAB in the last 72  hours.  Invalid input(s): FREET3  Other results:     Imaging/Studies:  No results found.  Latest Echo  Latest Cath   Medications:     Scheduled Medications: . atorvastatin  10 mg Oral q1800  . azelastine  1 spray Each Nare QHS  . brimonidine  1 drop Left Eye BID  . carvedilol  6.25 mg Oral BID WC  . diazepam  1 mg Oral QHS  . dorzolamide  1 drop Left Eye BID  . dutasteride  0.5 mg Oral Daily  . ferrous sulfate  325 mg Oral Q breakfast  . fluticasone  2 spray Each Nare Daily  . hydrALAZINE  25 mg Oral 3 times per day  . levothyroxine  50 mcg Oral QAC breakfast  .  loratadine  10 mg Oral Daily  . pantoprazole  40 mg Oral Daily  . potassium chloride  20 mEq Oral Daily  . potassium chloride  40 mEq Oral Once  . potassium chloride  40 mEq Oral Once  . sodium chloride flush  3 mL Intravenous Q12H  . torsemide  40 mg Oral Daily    Infusions:    PRN Medications: acetaminophen **OR** acetaminophen, dextromethorphan-guaiFENesin, diclofenac sodium, ondansetron **OR** ondansetron (ZOFRAN) IV, sodium chloride, traMADol   Assessment/Plan   Victor Green is a 82 yo male with chronic diastolic heart failure, non-obstructive CAD, hypertension and hyperlipidemia here with acute on chronic diastolic heart failure in the setting of medication changes. CHF asked to take over having follow family in the past.   1. Acute on chronic diastolic HF - Echo [id number] LVEF 55-60% with grade 1 DD - Volume status much improved. NYHA class III symptoms.  - Seems to diurese much better on torsemide than lasix.  Continue 40 mg daily. Supp K.  - on Chronic 02 at home.  - PT recommends HHPT with 24 hr supervision.  2. HTN - Much improved from admit with med optimization.  - Continue coreg 6.25 BID and hydralazine 25 mg TID.  3. Non-obstructive CAD - Continue lipitor 10 mg daily. 4. CKD -Cr up from admission but relatively stable on po diuretics.  - If remains elevated may need to adjust diuretic regimen.  20 mg torsemide likely too little. May need to alternate 20mg /40mg  daily.  5. HLD - Continue statin.    Dispo: Likely home today vs tomorrow.  Needs K supp. May be best to check BMET for Cr and K in am.    Length of Stay: 6  Shirley Friar PA-C 12/09/2015, 7:51 AM  Advanced Heart Failure Team Pager [phone] (M-F; 7a - 4p)  Please contact [facility] for night-coverage after hours (4p -7a ) and weekends on [email or web address]  Patient seen and examined with Oda Kilts, PA-C. We discussed all aspects of the encounter. I agree with the assessment and plan as stated  above.   Volume status much improved. Weight down almost 20 pounds. [place] with discharge today. Would use torsemide 40mg  daily and follow volume status and renal function closely.   Jennavieve Arrick,MD 11:33 AM

## 2015-12-09 NOTE — Progress Notes (Signed)
Physical Therapy Treatment Patient Details Name: Victor Green MRN: [id number] DOB: 03-12-1920 Today's Date: 12/09/2015    History of Present Illness 82 yo male with chronic diastolic heart failure, non-obstructive CAD, hypertension and hyperlipidemia here with acute on chronic diastolic heart failure     PT Comments    Patient more agreeable to work with PT today. Patient attempted sit <> stand transfer and pre-gait static standing activities with increased +2 assist. Patient remains fearful and limited by fatigue and anxiety. Patient was able to tolerate 4 shuffle steps to [facility] but again required increased physical assist. Will continue to see and progress as tolerated.   Follow Up Recommendations  Home health PT;Supervision/Assistance - 24 hour     Equipment Recommendations  Other (comment) (hoyer lift)    Recommendations for Other Services       Precautions / Restrictions Precautions Precautions: Fall Restrictions Weight Bearing Restrictions: No    Mobility  Bed Mobility Overal bed mobility: Needs Assistance Bed Mobility: Supine to Sit;Sit to Supine     Supine to sit: Mod assist Sit to supine: Mod assist   General bed mobility comments: Patient cued for use of side rail, able to initate movement toward EOB but required increased assist to elevate trunk and rotate to EOB  Transfers Overall transfer level: Needs assistance Equipment used: Rolling walker (2 wheeled) Transfers: Sit to/from Stand Sit to Stand: Mod assist;Max assist;+2 physical assistance;From elevated surface         General transfer comment: Performed x 4 during session. Patient required elevated bed position and increased assist using gait belt and chuck pad to elevate to upright. Once upright patient with significant posterior weight support, manual faciliation and verbal cues to improve postureal aligntment over base of support. Heavy reliance on RW. BLE buckling with fatigue in  standing  Ambulation/Gait Ambulation/Gait assistance: Max assist;+2 physical assistance Ambulation Distance (Feet): 4 Feet Assistive device: Rolling walker (2 wheeled) Gait Pattern/deviations: Step-to pattern     General Gait Details: side steps to [place]            Wheelchair Mobility    Modified Rankin (Stroke Patients Only)       Balance     Sitting balance-Leahy Scale: Fair     Standing balance support: Bilateral upper extremity supported;During functional activity Standing balance-Leahy Scale: Poor Standing balance comment: Performed pre gait static standing x4. Increased assist to maintain upright, significant posterior bias requirng verbal and tactile cues to facilitate postural support.                     Cognition Arousal/Alertness: Awake/alert Behavior During Therapy: Anxious Overall Cognitive Status: History of cognitive impairments - at baseline Area of Impairment: Following commands;Safety/judgement;Awareness;Problem solving       Following Commands: Follows one step commands with increased time Safety/Judgement: Decreased awareness of safety;Decreased awareness of deficits Awareness: Intellectual Problem Solving: Slow processing;Decreased initiation;Difficulty sequencing;Requires verbal cues;Requires tactile cues General Comments: Patient more ammenable to participate this session but still remains anxious at times    Exercises General Exercises - Lower Extremity Ankle Circles/Pumps: AAROM;Both;10 reps (RLE painful) Other Exercises Other Exercises: transfer training EOB with elevated surface/ pre gait weight shifts x4    General Comments        Pertinent Vitals/Pain Pain Assessment: Faces Faces Pain Scale: Hurts little more Pain Location: BLEs (Right Knee) Pain Descriptors / Indicators: Guarding;Grimacing;Moaning    Home Living  Prior Function            PT Goals (current goals can now  be found in the care plan section) Acute Rehab PT Goals Patient Stated Goal: to go home PT Goal Formulation: With patient/family Time For Goal Achievement: 12/18/15 Potential to Achieve Goals: Fair Progress towards PT goals: Progressing toward goals (modestly)    Frequency  Min 2X/week    PT Plan Current plan remains appropriate    Co-evaluation             End of Session Equipment Utilized During Treatment: Oxygen Activity Tolerance: Patient limited by fatigue;Other (comment) (and need for pericare) Patient left: in bed;with call bell/phone within reach;with family/visitor present     Time: 0922-0941 PT Time Calculation (min) (ACUTE ONLY): 19 min  Charges:  $Therapeutic Activity: 8-22 mins                    G CodesDuncan Dull 12/22/2015, 10:50 AM Alben Deeds, PT DPT  [phone]

## 2015-12-09 NOTE — Progress Notes (Signed)
Home discharge instructions discussed with patient and personal [place] who is at bedside. HHRN taking patient home. Discussed diet, activity, follow up appt, and medications. Prescriptions for Hydralazine, Potassium and Torsemide given. Jeanie (pt HHRN) verbalizes understanding of instructions.

## 2015-12-11 ENCOUNTER — Telehealth: Payer: Self-pay | Admitting: Internal Medicine

## 2015-12-11 NOTE — Telephone Encounter (Signed)
Spoke to caregiver who called earlier  Family wanted her to call   Stomach a little harder than normal BP 126/67    Wt 201.3  (base wt from Hosp 198 lb) Breathing comfortably. Continues on O2    1200 cc  Told them to keep on same regimen  No change in diuretic  This is a PM wt. [id number] each AM after urinating    Continue to follow clinically.

## 2015-12-24 ENCOUNTER — Telehealth (HOSPITAL_COMMUNITY): Payer: Self-pay | Admitting: *Deleted

## 2015-12-24 NOTE — Telephone Encounter (Signed)
Almyra Free called and stated pts weight is up since discharge on 2/6. Pt was 198lbs at discharge he is currently 207 lbs. Pt was taking Torsemide 40mg  daily with 85meq of K. Patient is scheduled to see Dr.Bensimhon on 2/23. Advised to take Torsemide 40mg  bid with 72meq of K bid until appointment (per Dr.McLean)

## 2015-12-26 ENCOUNTER — Encounter (HOSPITAL_COMMUNITY): Payer: Self-pay | Admitting: Internal Medicine

## 2015-12-26 ENCOUNTER — Ambulatory Visit (HOSPITAL_COMMUNITY)
Admit: 2015-12-26 | Discharge: 2015-12-26 | Disposition: A | Discharge status: Home / Self Care | Payer: Medicare Other | Source: Ambulatory Visit | Attending: Internal Medicine | Admitting: Internal Medicine

## 2015-12-26 VITALS — BP 120/60 | HR 64 | Wt 207.0 lb

## 2015-12-26 DIAGNOSIS — I5032 Chronic diastolic (congestive) heart failure: Secondary | ICD-10-CM | POA: Diagnosis not present

## 2015-12-26 DIAGNOSIS — I2510 Atherosclerotic heart disease of native coronary artery without angina pectoris: Secondary | ICD-10-CM | POA: Diagnosis not present

## 2015-12-26 DIAGNOSIS — I10 Essential (primary) hypertension: Secondary | ICD-10-CM | POA: Diagnosis not present

## 2015-12-26 DIAGNOSIS — H409 Unspecified glaucoma: Secondary | ICD-10-CM | POA: Insufficient documentation

## 2015-12-26 DIAGNOSIS — I110 Hypertensive heart disease with heart failure: Secondary | ICD-10-CM | POA: Diagnosis not present

## 2015-12-26 DIAGNOSIS — E785 Hyperlipidemia, unspecified: Secondary | ICD-10-CM | POA: Insufficient documentation

## 2015-12-26 DIAGNOSIS — Z79899 Other long term (current) drug therapy: Secondary | ICD-10-CM | POA: Insufficient documentation

## 2015-12-26 DIAGNOSIS — J45909 Unspecified asthma, uncomplicated: Secondary | ICD-10-CM | POA: Insufficient documentation

## 2015-12-26 DIAGNOSIS — F0390 Unspecified dementia without behavioral disturbance: Secondary | ICD-10-CM | POA: Diagnosis not present

## 2015-12-26 LAB — CBC
HEMATOCRIT: 24.6 % — AB (ref 39.0–52.0)
HEMOGLOBIN: 7.5 g/dL — AB (ref 13.0–17.0)
MCH: 31.9 pg (ref 26.0–34.0)
MCHC: 30.5 g/dL (ref 30.0–36.0)
MCV: 104.7 fL — AB (ref 78.0–100.0)
Platelets: DECREASED 10*3/uL (ref 150–400)
RBC: 2.35 MIL/uL — AB (ref 4.22–5.81)
RDW: 20.2 % — ABNORMAL HIGH (ref 11.5–15.5)
WBC: 12.8 10*3/uL — AB (ref 4.0–10.5)

## 2015-12-26 LAB — BASIC METABOLIC PANEL
Anion gap: 10 (ref 5–15)
BUN: 46 mg/dL — ABNORMAL HIGH (ref 6–20)
CHLORIDE: 104 mmol/L (ref 101–111)
CO2: 31 mmol/L (ref 22–32)
Calcium: 9.1 mg/dL (ref 8.9–10.3)
Creatinine, Ser: 1.66 mg/dL — ABNORMAL HIGH (ref 0.61–1.24)
GFR calc non Af Amer: 34 mL/min — ABNORMAL LOW (ref >60)
GFR, EST AFRICAN AMERICAN: 39 mL/min — AB (ref >60)
Glucose, Bld: 159 mg/dL — ABNORMAL HIGH (ref 65–99)
POTASSIUM: 5 mmol/L (ref 3.5–5.1)
SODIUM: 145 mmol/L (ref 135–145)

## 2015-12-26 MED ORDER — METOLAZONE 2.5 MG PO TABS: ORAL_TABLET | ORAL | Status: AC

## 2015-12-26 NOTE — Patient Instructions (Signed)
Take Metolazone 2.5mg  (1 tablet) by mouth every Monday and Friday.  Do not take if weight is 200lbs or less.  Take 20meq of potassium with Metolazone.  Routine lab work today. (bmet cbc) Will notify you of abnormal results  Repeat lab work in 1 week.  Follow up in 4-6 weeks.

## 2015-12-26 NOTE — Progress Notes (Signed)
ADVANCED HF CLINIC NOTE  Patient ID: Victor Green, male   DOB: 09-16-1920, 82 y.o.   MRN: [id number] PCP: [facility] Primary Cardiologist: Romelia Bromell  HPI:   Victor Green is a 82 y.o. male with a history of chronic diastolic CHF (Last echo 123456 showed grade 1 diastolic dysfunction and EF 55-60%.), CAD (extensive of coronary calcification by EBCT 2009, Myoview Normal 2009), HTN, HLD,  Recently admitted diuresed 20 pounds. Discharged on 2/6 weight 198.  Switched to torsemide 40 bid.   Weight slowly increasing now up to 207. Having leg spasms restless legs.   Working with PT/OT. Feeling better. Still cannot stand. Edema improved. No orthopnea or PND. No CP.     ROS: All systems negative except as listed in HPI, PMH and Problem List.  SH:  Social History   Social History  . Marital Status: Married    Spouse Name: N/A  . Number of Children: N/A  . Years of Education: N/A   Occupational History  . Not on file.   Social History Main Topics  . Smoking status: Never Smoker   . Smokeless tobacco: Not on file  . Alcohol Use: No  . Drug Use: No  . Sexual Activity: Not on file   Other Topics Concern  . Not on file   Social History Narrative    FH:  Family History  Problem Relation Age of Onset  . Thrombosis Father     mesenteric artery thrombosis after prostate surgery  . Colon cancer Mother     Past Medical History  Diagnosis Date  . Hypertension   . CAD (coronary artery disease) complete w/u in 2009    extensive coronary calcification by EBCT, normal myoview, echo with EF 70% mild AI  . Hyperlipidemia   . Aortic insufficiency     echo July 2011 EF 60%  . 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 History of GI bleed 2003    due to pyloric-positive ulcer   . Diverticulosis     with remote history of diverticulitis  . Plantar fasciitis     chronic  . Allergic rhinitis     responding well to desensitation injections  . Glaucoma   .  Osteoarthritis     both knees  . Asthma   . CHF (congestive heart failure) ([place])     Current Outpatient Prescriptions  Medication Sig Dispense Refill  . acetaminophen (TYLENOL) 325 MG tablet Take 325 mg by mouth every morning.     Marland Kitchen atorvastatin (LIPITOR) 10 MG tablet Take 1 tablet by mouth  daily 90 tablet 1  . azelastine (ASTELIN) 0.1 % nasal spray Place 1 spray into both nostrils 2 (two) times daily. Use in each nostril as directed (Patient taking differently: Place 1 spray into both nostrils at bedtime. Use in each nostril as directed) 30 mL 3  . B Complex-Biotin-FA (B-COMPLEX PO) Take 1 tablet by mouth daily.     . brimonidine (ALPHAGAN) 0.15 % ophthalmic solution Place 1 drop into the left eye 2 (two) times daily.     . carvedilol (COREG) 6.25 MG tablet Take 1 tablet (6.25 mg total) by mouth 2 (two) times daily with a meal. 60 tablet 0  . cloNIDine (CATAPRES) 0.1 MG tablet Take 0.1 mg by mouth 3 (three) times daily as needed (if blood pressure is over 180).    Marland Kitchen dextromethorphan-guaiFENesin (MUCINEX DM) 30-600 MG 12hr tablet Take 1 tablet by mouth 2 (two)  times daily as needed for cough.    . diazepam (VALIUM) 2 MG tablet Take 0.5 tablets (1 mg total) by mouth at bedtime. 90 tablet 1  . diclofenac sodium (VOLTAREN) 1 % GEL Apply 3 times per day (Patient taking differently: Apply 2 g topically daily as needed (for right knee). Apply 3 times per day) 60 g 1  . dorzolamide (TRUSOPT) 2 % ophthalmic solution Place 1 drop into the left eye 2 (two) times daily.     Marland Kitchen dutasteride (AVODART) 0.5 MG capsule Take 1 capsule by mouth  daily (Patient taking differently: Take 1 capsule by mouth  daily in evening) 90 capsule 1  . EPIPEN 2-PAK 0.3 MG/0.3ML SOAJ injection Inject 0.51ml (0.3mg ) into  the muscle once as directed 2 Device 0  . ferrous sulfate 325 (65 FE) MG tablet Take 325 mg by mouth daily with breakfast.    . fexofenadine (ALLEGRA) 180 MG tablet Take 90 mg by mouth every evening. 1/2 tablet  once a day    . fluticasone (FLONASE) 50 MCG/ACT nasal spray Use 2 sprays in each  nostril twice daily 64 g 1  . glucosamine-chondroitin 500-400 MG tablet Take 1 tablet by mouth 2 (two) times daily.     Marland Kitchen guaiFENesin (MUCINEX) 600 MG 12 hr tablet Take 1 tablet (600 mg total) by mouth 2 (two) times daily as needed for to loosen phlegm. 20 tablet 0  . hydrALAZINE (APRESOLINE) 25 MG tablet Take 1 tablet (25 mg total) by mouth 3 (three) times daily. 90 tablet 3  . levothyroxine (SYNTHROID, LEVOTHROID) 50 MCG tablet Take 1 tablet by mouth  daily (Patient taking differently: Take 0.5 tablet (22mcg) by mouth  daily) 90 tablet 1  . Multiple Vitamin (MULTIVITAMIN WITH MINERALS) TABS tablet Take 1 tablet by mouth daily.    . pantoprazole (PROTONIX) 40 MG tablet Take 1 tablet by mouth  daily 90 tablet 1  . potassium chloride SA (K-DUR,KLOR-CON) 20 MEQ tablet Take 40 mEq by mouth 2 (two) times daily.    . Probiotic Product (ALIGN) 4 MG CAPS Take 4 mg by mouth every evening.    . torsemide (DEMADEX) 20 MG tablet Take 40 mg by mouth 2 (two) times daily.    . traMADol (ULTRAM) 50 MG tablet Half to 1 tablet with food up to 3 times a day for pain (Patient taking differently: Take 25-50 mg by mouth 3 (three) times daily as needed for moderate pain. Half to 1 tablet with food up to 3 times a day for pain) 30 tablet 0   No current facility-administered medications for this encounter.    Filed Vitals:   12/26/15 1037  BP: 120/60  Pulse: 64  Weight: 207 lb (93.895 kg)  SpO2: 98%    PHYSICAL EXAM:  General:  Elderly in WC.  HEENT: normal Neck: supple. JVP 7 Carotids 2+ bilaterally; no bruits. No lymphadenopathy or thryomegaly appreciated. Cor: PMI normal. Regular rate & rhythm. 2/6 SEM at RUSB Lungs: clear Abdomen: soft, nontender, mildly distended. No hepatosplenomegaly. No bruits or masses. Good bowel sounds. Extremities: no cyanosis, clubbing, rash, trace edema Neuro: alert & oriented, cranial nerves  grossly intact. Moves all 4 extremities w/o difficulty. Affect pleasant.   ASSESSMENT & PLAN: 1. Chronic diastolic HF 2. HTN 3. CAD 4. Dementia  Volume status creeping back up slowly. Add metolazone 2.5 M/F with KCL 20. Hold metolazone if weight <= 200. If using metalozone more frequently can increase torsemide to 60 daily. Check labs next week.  BP looks good. Continue current regimen.   Liany Mumpower,MD 12:23 PM

## 2015-12-27 ENCOUNTER — Other Ambulatory Visit (HOSPITAL_COMMUNITY): Payer: Self-pay

## 2015-12-27 MED ORDER — TORSEMIDE 20 MG PO TABS: 40.0000 mg | ORAL_TABLET | Freq: Two times a day (BID) | ORAL | Status: DC

## 2015-12-27 MED ORDER — POTASSIUM CHLORIDE CRYS ER 20 MEQ PO TBCR: 40.0000 meq | EXTENDED_RELEASE_TABLET | Freq: Two times a day (BID) | ORAL | Status: DC

## 2016-01-03 ENCOUNTER — Ambulatory Visit (HOSPITAL_COMMUNITY)
Admission: RE | Admit: 2016-01-03 | Discharge: 2016-01-03 | Disposition: A | Discharge status: Home / Self Care | Payer: Medicare Other | Source: Ambulatory Visit | Attending: Cardiology | Admitting: Cardiology

## 2016-01-03 DIAGNOSIS — I509 Heart failure, unspecified: Secondary | ICD-10-CM | POA: Diagnosis not present

## 2016-01-03 LAB — CBC
HEMATOCRIT: 22.8 % — AB (ref 39.0–52.0)
HEMOGLOBIN: 7.1 g/dL — AB (ref 13.0–17.0)
MCH: 31.8 pg (ref 26.0–34.0)
MCHC: 31.1 g/dL (ref 30.0–36.0)
MCV: 102.2 fL — AB (ref 78.0–100.0)
Platelets: 57 10*3/uL — ABNORMAL LOW (ref 150–400)
RBC: 2.23 MIL/uL — ABNORMAL LOW (ref 4.22–5.81)
RDW: 19.9 % — AB (ref 11.5–15.5)
WBC: 10.4 10*3/uL (ref 4.0–10.5)

## 2016-01-03 LAB — BASIC METABOLIC PANEL
ANION GAP: 8 (ref 5–15)
BUN: 48 mg/dL — AB (ref 6–20)
CHLORIDE: 100 mmol/L — AB (ref 101–111)
CO2: 32 mmol/L (ref 22–32)
Calcium: 9 mg/dL (ref 8.9–10.3)
Creatinine, Ser: 1.59 mg/dL — ABNORMAL HIGH (ref 0.61–1.24)
GFR calc Af Amer: 41 mL/min — ABNORMAL LOW (ref >60)
GFR calc non Af Amer: 36 mL/min — ABNORMAL LOW (ref >60)
GLUCOSE: 135 mg/dL — AB (ref 65–99)
POTASSIUM: 3.7 mmol/L (ref 3.5–5.1)
Sodium: 140 mmol/L (ref 135–145)

## 2016-01-08 ENCOUNTER — Telehealth (HOSPITAL_COMMUNITY): Payer: Self-pay | Admitting: *Deleted

## 2016-01-08 NOTE — Telephone Encounter (Signed)
Family member left a VM asking that pt's labs results be faxed to pcp, results faxed to Dr Ardeth Perfect

## 2016-01-09 ENCOUNTER — Encounter (HOSPITAL_COMMUNITY): Payer: Self-pay | Admitting: *Deleted

## 2016-01-09 ENCOUNTER — Emergency Department (HOSPITAL_COMMUNITY): Payer: Medicare Other

## 2016-01-09 ENCOUNTER — Observation Stay (HOSPITAL_COMMUNITY)
Admission: EM | Admit: 2016-01-09 | Discharge: 2016-01-10 | Disposition: A | Discharge status: Home / Self Care | Payer: Medicare Other | Attending: Internal Medicine | Admitting: Internal Medicine

## 2016-01-09 DIAGNOSIS — D500 Iron deficiency anemia secondary to blood loss (chronic): Secondary | ICD-10-CM | POA: Diagnosis present

## 2016-01-09 DIAGNOSIS — I10 Essential (primary) hypertension: Secondary | ICD-10-CM | POA: Diagnosis present

## 2016-01-09 DIAGNOSIS — D696 Thrombocytopenia, unspecified: Secondary | ICD-10-CM | POA: Diagnosis present

## 2016-01-09 DIAGNOSIS — Z79899 Other long term (current) drug therapy: Secondary | ICD-10-CM | POA: Diagnosis not present

## 2016-01-09 DIAGNOSIS — I2510 Atherosclerotic heart disease of native coronary artery without angina pectoris: Secondary | ICD-10-CM | POA: Diagnosis not present

## 2016-01-09 DIAGNOSIS — I130 Hypertensive heart and chronic kidney disease with heart failure and stage 1 through stage 4 chronic kidney disease, or unspecified chronic kidney disease: Secondary | ICD-10-CM | POA: Insufficient documentation

## 2016-01-09 DIAGNOSIS — E785 Hyperlipidemia, unspecified: Secondary | ICD-10-CM | POA: Insufficient documentation

## 2016-01-09 DIAGNOSIS — D539 Nutritional anemia, unspecified: Principal | ICD-10-CM | POA: Insufficient documentation

## 2016-01-09 DIAGNOSIS — Z7951 Long term (current) use of inhaled steroids: Secondary | ICD-10-CM | POA: Insufficient documentation

## 2016-01-09 DIAGNOSIS — D649 Anemia, unspecified: Secondary | ICD-10-CM | POA: Diagnosis present

## 2016-01-09 DIAGNOSIS — I453 Trifascicular block: Secondary | ICD-10-CM | POA: Insufficient documentation

## 2016-01-09 DIAGNOSIS — I351 Nonrheumatic aortic (valve) insufficiency: Secondary | ICD-10-CM | POA: Insufficient documentation

## 2016-01-09 DIAGNOSIS — N183 Chronic kidney disease, stage 3 unspecified: Secondary | ICD-10-CM | POA: Diagnosis present

## 2016-01-09 DIAGNOSIS — I5032 Chronic diastolic (congestive) heart failure: Secondary | ICD-10-CM | POA: Diagnosis present

## 2016-01-09 DIAGNOSIS — H409 Unspecified glaucoma: Secondary | ICD-10-CM | POA: Diagnosis not present

## 2016-01-09 DIAGNOSIS — E039 Hypothyroidism, unspecified: Secondary | ICD-10-CM | POA: Diagnosis present

## 2016-01-09 LAB — COMPREHENSIVE METABOLIC PANEL
ALBUMIN: 3.6 g/dL (ref 3.5–5.0)
ALK PHOS: 50 U/L (ref 38–126)
ALT: 16 U/L — AB (ref 17–63)
ANION GAP: 8 (ref 5–15)
AST: 29 U/L (ref 15–41)
BUN: 48 mg/dL — AB (ref 6–20)
CALCIUM: 8.7 mg/dL — AB (ref 8.9–10.3)
CO2: 32 mmol/L (ref 22–32)
CREATININE: 1.34 mg/dL — AB (ref 0.61–1.24)
Chloride: 100 mmol/L — ABNORMAL LOW (ref 101–111)
GFR calc Af Amer: 51 mL/min — ABNORMAL LOW (ref >60)
GFR calc non Af Amer: 44 mL/min — ABNORMAL LOW (ref >60)
GLUCOSE: 107 mg/dL — AB (ref 65–99)
Potassium: 4.1 mmol/L (ref 3.5–5.1)
SODIUM: 140 mmol/L (ref 135–145)
Total Bilirubin: 0.7 mg/dL (ref 0.3–1.2)
Total Protein: 6.6 g/dL (ref 6.5–8.1)

## 2016-01-09 LAB — CBC
HCT: 22.2 % — ABNORMAL LOW (ref 39.0–52.0)
HEMOGLOBIN: 6.8 g/dL — AB (ref 13.0–17.0)
MCH: 32.5 pg (ref 26.0–34.0)
MCHC: 30.6 g/dL (ref 30.0–36.0)
MCV: 106.2 fL — ABNORMAL HIGH (ref 78.0–100.0)
Platelets: 54 10*3/uL — ABNORMAL LOW (ref 150–400)
RBC: 2.09 MIL/uL — ABNORMAL LOW (ref 4.22–5.81)
RDW: 20.4 % — AB (ref 11.5–15.5)
WBC: 12.1 10*3/uL — ABNORMAL HIGH (ref 4.0–10.5)

## 2016-01-09 LAB — PREPARE RBC (CROSSMATCH)

## 2016-01-09 LAB — IRON AND TIBC
Iron: 107 ug/dL (ref 45–182)
SATURATION RATIOS: 40 % — AB (ref 17.9–39.5)
TIBC: 269 ug/dL (ref 250–450)
UIBC: 162 ug/dL

## 2016-01-09 LAB — POC OCCULT BLOOD, ED: FECAL OCCULT BLD: NEGATIVE

## 2016-01-09 LAB — FERRITIN: Ferritin: 1090 ng/mL — ABNORMAL HIGH (ref 24–336)

## 2016-01-09 LAB — VITAMIN B12: VITAMIN B 12: 3027 pg/mL — AB (ref 180–914)

## 2016-01-09 MED ORDER — HYDRALAZINE HCL 25 MG PO TABS
25.0000 mg | ORAL_TABLET | Freq: Three times a day (TID) | ORAL | Status: DC
  Administered 2016-01-09 – 2016-01-10 (×2): 25 mg via ORAL
  Filled 2016-01-09 (×3): qty 1

## 2016-01-09 MED ORDER — SODIUM CHLORIDE 0.9% FLUSH
3.0000 mL | INTRAVENOUS | Status: DC | PRN
  Administered 2016-01-10: 3 mL via INTRAVENOUS
  Filled 2016-01-09: qty 3

## 2016-01-09 MED ORDER — FUROSEMIDE 10 MG/ML IJ SOLN
20.0000 mg | Freq: Once | INTRAMUSCULAR | Status: AC
  Administered 2016-01-09: 20 mg via INTRAVENOUS
  Filled 2016-01-09: qty 2

## 2016-01-09 MED ORDER — BRIMONIDINE TARTRATE 0.15 % OP SOLN
1.0000 [drp] | Freq: Two times a day (BID) | OPHTHALMIC | Status: DC
  Administered 2016-01-09 – 2016-01-10 (×2): 1 [drp] via OPHTHALMIC
  Filled 2016-01-09: qty 5

## 2016-01-09 MED ORDER — ADULT MULTIVITAMIN W/MINERALS CH
1.0000 | ORAL_TABLET | Freq: Every day | ORAL | Status: DC
  Administered 2016-01-09 – 2016-01-10 (×2): 1 via ORAL
  Filled 2016-01-09: qty 1

## 2016-01-09 MED ORDER — ALIGN 4 MG PO CAPS: 4.0000 mg | ORAL_CAPSULE | Freq: Every evening | ORAL | Status: DC

## 2016-01-09 MED ORDER — DIAZEPAM 2 MG PO TABS
1.0000 mg | ORAL_TABLET | Freq: Every day | ORAL | Status: DC
  Administered 2016-01-09: 1 mg via ORAL
  Filled 2016-01-09: qty 1

## 2016-01-09 MED ORDER — TORSEMIDE 20 MG PO TABS
40.0000 mg | ORAL_TABLET | Freq: Every day | ORAL | Status: DC
  Administered 2016-01-10: 40 mg via ORAL
  Filled 2016-01-09: qty 2

## 2016-01-09 MED ORDER — METOLAZONE 2.5 MG PO TABS
2.5000 mg | ORAL_TABLET | Freq: Every day | ORAL | Status: DC
  Administered 2016-01-10: 2.5 mg via ORAL
  Filled 2016-01-09: qty 1

## 2016-01-09 MED ORDER — ACETAMINOPHEN 325 MG PO TABS: 650.0000 mg | ORAL_TABLET | Freq: Four times a day (QID) | ORAL | Status: DC | PRN

## 2016-01-09 MED ORDER — SODIUM CHLORIDE 0.9 % IV SOLN: 250.0000 mL | INTRAVENOUS | Status: DC | PRN

## 2016-01-09 MED ORDER — ATORVASTATIN CALCIUM 10 MG PO TABS
10.0000 mg | ORAL_TABLET | Freq: Every day | ORAL | Status: DC
  Administered 2016-01-09: 10 mg via ORAL
  Filled 2016-01-09 (×2): qty 1

## 2016-01-09 MED ORDER — LORATADINE 10 MG PO TABS
10.0000 mg | ORAL_TABLET | Freq: Every day | ORAL | Status: DC
  Administered 2016-01-09 – 2016-01-10 (×2): 10 mg via ORAL
  Filled 2016-01-09: qty 1

## 2016-01-09 MED ORDER — ONDANSETRON HCL 4 MG/2ML IJ SOLN: 4.0000 mg | Freq: Four times a day (QID) | INTRAMUSCULAR | Status: DC | PRN

## 2016-01-09 MED ORDER — AZELASTINE HCL 0.1 % NA SOLN
1.0000 | Freq: Two times a day (BID) | NASAL | Status: DC
Start: 2016-01-09 — End: 2016-01-10
  Administered 2016-01-09 – 2016-01-10 (×2): 1 via NASAL
  Filled 2016-01-09: qty 30

## 2016-01-09 MED ORDER — FLUTICASONE PROPIONATE 50 MCG/ACT NA SUSP
2.0000 | Freq: Every day | NASAL | Status: DC
  Administered 2016-01-10: 2 via NASAL
  Filled 2016-01-09: qty 16

## 2016-01-09 MED ORDER — SODIUM CHLORIDE 0.9 % IV SOLN
80.0000 mg | Freq: Once | INTRAVENOUS | Status: DC
  Filled 2016-01-09: qty 80

## 2016-01-09 MED ORDER — PANTOPRAZOLE SODIUM 40 MG IV SOLR
40.0000 mg | Freq: Two times a day (BID) | INTRAVENOUS | Status: DC
  Administered 2016-01-10: 40 mg via INTRAVENOUS
  Filled 2016-01-09: qty 40

## 2016-01-09 MED ORDER — SODIUM CHLORIDE 0.9 % IV SOLN
10.0000 mL/h | Freq: Once | INTRAVENOUS | Status: AC
  Administered 2016-01-09: 10 mL/h via INTRAVENOUS

## 2016-01-09 MED ORDER — SODIUM CHLORIDE 0.9 % IV SOLN: Freq: Once | INTRAVENOUS | Status: DC

## 2016-01-09 MED ORDER — CLONIDINE HCL 0.1 MG PO TABS
0.1000 mg | ORAL_TABLET | Freq: Two times a day (BID) | ORAL | Status: DC
  Administered 2016-01-09 – 2016-01-10 (×2): 0.1 mg via ORAL
  Filled 2016-01-09 (×2): qty 1

## 2016-01-09 MED ORDER — RISAQUAD PO CAPS
1.0000 | ORAL_CAPSULE | Freq: Every day | ORAL | Status: DC
  Administered 2016-01-10: 1 via ORAL
  Filled 2016-01-09: qty 1

## 2016-01-09 MED ORDER — SODIUM CHLORIDE 0.9% FLUSH
3.0000 mL | Freq: Two times a day (BID) | INTRAVENOUS | Status: DC
  Administered 2016-01-09 (×2): 3 mL via INTRAVENOUS

## 2016-01-09 MED ORDER — ACETAMINOPHEN 650 MG RE SUPP: 650.0000 mg | Freq: Four times a day (QID) | RECTAL | Status: DC | PRN

## 2016-01-09 MED ORDER — ONDANSETRON HCL 4 MG PO TABS: 4.0000 mg | ORAL_TABLET | Freq: Four times a day (QID) | ORAL | Status: DC | PRN

## 2016-01-09 MED ORDER — CARVEDILOL 6.25 MG PO TABS
6.2500 mg | ORAL_TABLET | Freq: Two times a day (BID) | ORAL | Status: DC
  Administered 2016-01-09 – 2016-01-10 (×2): 6.25 mg via ORAL
  Filled 2016-01-09 (×2): qty 1

## 2016-01-09 MED ORDER — SODIUM CHLORIDE 0.9% FLUSH: 3.0000 mL | Freq: Two times a day (BID) | INTRAVENOUS | Status: DC

## 2016-01-09 MED ORDER — DORZOLAMIDE HCL 2 % OP SOLN
1.0000 [drp] | Freq: Two times a day (BID) | OPHTHALMIC | Status: DC
  Administered 2016-01-09 – 2016-01-10 (×2): 1 [drp] via OPHTHALMIC
  Filled 2016-01-09: qty 10

## 2016-01-09 MED ORDER — MORPHINE SULFATE (PF) 2 MG/ML IV SOLN: 1.0000 mg | INTRAVENOUS | Status: DC | PRN

## 2016-01-09 MED ORDER — GLUCOSAMINE-CHONDROITIN 500-400 MG PO TABS: 1.0000 | ORAL_TABLET | Freq: Two times a day (BID) | ORAL | Status: DC

## 2016-01-09 MED ORDER — LEVOTHYROXINE SODIUM 50 MCG PO TABS
50.0000 ug | ORAL_TABLET | Freq: Every day | ORAL | Status: DC
  Administered 2016-01-10: 50 ug via ORAL
  Filled 2016-01-09: qty 1

## 2016-01-09 MED ORDER — TRAMADOL HCL 50 MG PO TABS: 50.0000 mg | ORAL_TABLET | Freq: Four times a day (QID) | ORAL | Status: DC | PRN

## 2016-01-09 MED ORDER — FERROUS SULFATE 325 (65 FE) MG PO TABS
325.0000 mg | ORAL_TABLET | Freq: Every evening | ORAL | Status: DC
  Administered 2016-01-09: 325 mg via ORAL
  Filled 2016-01-09 (×2): qty 1

## 2016-01-09 NOTE — ED Notes (Signed)
Pt can go up at 15:05

## 2016-01-09 NOTE — ED Notes (Signed)
Per PTAR, pt's caregiver reports receiving a call today, states that his Hgb is 6.  Pt denies any dizziness or any pain at this time.  Caregiver reports pt's stool appears "black" in color but it's not coffee ground in appearance.

## 2016-01-09 NOTE — Progress Notes (Signed)
Received pt from ED, VS obtained, telemetry applied, oriented to unit, call light placed in reach  

## 2016-01-09 NOTE — H&P (Signed)
[facility] History and Physical  [name] [id number] DOB: [birth year] DOA: 01/09/2016  Referring physician: ED physician PCP: Velna Hatchet, MD  Specialists:  Dr. Haroldine Laws (cardiology), Dr. Julien Nordmann (hematology)   Chief Complaint:  Low Hgb  HPI: Victor Green is a 82 y.o. male with PMH of hypertension, hypothyroidism, CAD, and chronic diastolic CHF who presents to the ED at the direction of his primary care physician for evaluation of a critically low hemoglobin. Patient has a long history of anemia and thrombocytopenia that has been evaluated by hematology in the past with no clear diagnosis. Over the past 3 months, he has been followed by his PCP for progressive macrocytic anemia. His caregiver received a phone call earlier today notifying them of a hemoglobin in the mid 6 range and recommending they go to the nearest ED for evaluation. Patient denies chest pain, palpitations, headaches, or lightheadedness. Stools have reportedly been dark, but no maroon or red blood is seen in the bowel movements. There has been no hematemesis, hemoptysis, or gross hematuria. Patient denies abdominal pain, nausea, or diarrhea. He denies any history of similar circumstances, but his son at the bedside and notes to episodes of GI bleeding which were attributed to peptic ulcers in the past. Approximately 45 years ago, the patient was hospitalized for treatment of a bleeding peptic ulcer. Again, roughly 15 years ago, patient was apparently admitted to the hospital and treated for another bleeding peptic ulcer with identification of H. pylori association. Patient is been managed on daily Protonix 40 mg and reports feeling quite well.  In ED, patient was found to be afebrile, saturating well on 2 L/m supplemental oxygen, and with vital signs stable. Chest x-ray was obtained and negative for acute cardiopulmonary disease. CBC is notable for hemoglobin of 6.8 and hemoglobin of 12.1. Chemistry panel  features a serum creatinine of 1.34, up from an apparent baseline of 1.1. BUN is elevated to a value of 48. 2 units of packed red blood cells were ordered from the ED for immediate transfusion, the patient remained hemodynamically stable, and he will be admitted to the telemetry unit for ongoing evaluation and management of GI blood loss anemia.   Where does patient live?   At home     Can patient participate in ADLs?  Some   Review of Systems:   General: no fevers, chills, sweats, weight change, or poor appetite. Fatigue HEENT: no blurry vision, hearing changes or sore throat Pulm: no dyspnea, cough, or wheeze CV: no chest pain or palpitations Abd: no nausea, vomiting, abdominal pain, diarrhea, or constipation GU: no dysuria, hematuria, increased urinary frequency, or urgency  Ext: no leg edema Neuro: no focal weakness, numbness, or tingling, no vision change or hearing loss Skin: no rash, no wounds MSK: No muscle spasm, no deformity, no red, hot, or swollen joint Heme: No easy bruising or bleeding Travel history: No recent long distant travel    Allergy:  Allergies  Allergen Reactions  . Other Anaphylaxis    Horse serum  . Tetanus Toxoids Other (See Comments)    Past Medical History  Diagnosis Date  . Hypertension   . CAD (coronary artery disease) complete w/u in 2009    extensive coronary calcification by EBCT, normal myoview, echo with EF 70% mild AI  . Hyperlipidemia   . Aortic insufficiency     echo July 2011 EF 60%  . 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 History of GI  bleed 2003    due to pyloric-positive ulcer   . Diverticulosis     with remote history of diverticulitis  . Plantar fasciitis     chronic  . Allergic rhinitis     responding well to desensitation injections  . Glaucoma   . Osteoarthritis     both knees  . Asthma   . CHF (congestive heart failure) [employer])     Past Surgical History  Procedure Laterality Date  . Tonsillectomy     . Hemorrhoid surgery  1996  . Hernia repair  1996    right  . Lumbar laminectomy  2003  . Cataract extraction      left eye    Social History:  reports that he has never smoked. He does not have any smokeless tobacco history on file. He reports that he does not drink alcohol or use illicit drugs.  Family History:  Family History  Problem Relation Age of Onset  . Thrombosis Father     mesenteric artery thrombosis after prostate surgery  . Colon cancer Mother      Prior to Admission medications   Medication Sig Start Date End Date Taking? Authorizing Provider  acetaminophen (TYLENOL) 325 MG tablet Take 325 mg by mouth every morning.    Yes Historical Provider, MD  atorvastatin (LIPITOR) 10 MG tablet Take 1 tablet by mouth  daily 07/09/15  Yes [name], MD  azelastine (ASTELIN) 0.1 % nasal spray Place 1 spray into both nostrils 2 (two) times daily. Use in each nostril as directed 08/20/14  Yes [name], MD  B Complex-Biotin-FA (B-COMPLEX PO) Take 1 tablet by mouth daily.    Yes Historical Provider, MD  brimonidine (ALPHAGAN) 0.15 % ophthalmic solution Place 1 drop into the left eye 2 (two) times daily.    Yes Historical Provider, MD  carvedilol (COREG) 6.25 MG tablet Take 1 tablet (6.25 mg total) by mouth 2 (two) times daily with a meal. 09/22/15  Yes Debbe Odea, MD  cloNIDine (CATAPRES) 0.1 MG tablet Take 0.1 mg by mouth 3 (three) times daily as needed. BP. 11/22/15  Yes Historical Provider, MD  diazepam (VALIUM) 2 MG tablet Take 0.5 tablets (1 mg total) by mouth at bedtime. 10/11/15  Yes [name], MD  dorzolamide (TRUSOPT) 2 % ophthalmic solution Place 1 drop into the left eye 2 (two) times daily.    Yes Historical Provider, MD  EPIPEN 2-PAK 0.3 MG/0.3ML SOAJ injection Inject 0.55ml (0.3mg ) into  the muscle once as directed 12/26/14  Yes [name], MD  ferrous sulfate 325 (65 FE) MG tablet Take 325 mg by mouth every evening.    Yes Historical Provider, MD  fexofenadine (ALLEGRA) 30  MG tablet Take 45 mg by mouth daily.   Yes Historical Provider, MD  fluticasone (FLONASE) 50 MCG/ACT nasal spray Use 2 sprays in each  nostril twice daily 08/21/15  Yes [name], MD  glucosamine-chondroitin 500-400 MG tablet Take 1 tablet by mouth 2 (two) times daily.    Yes Historical Provider, MD  hydrALAZINE (APRESOLINE) 25 MG tablet Take 1 tablet (25 mg total) by mouth 3 (three) times daily. 12/09/15  Yes Ripudeep Krystal Eaton, MD  levothyroxine (SYNTHROID, LEVOTHROID) 50 MCG tablet Take 1 tablet by mouth  daily 07/01/15  Yes [name], MD  metolazone (ZAROXOLYN) 2.5 MG tablet Take 1 tablet by mouth every Monday and Friday.  Hold if weight is 200lbs or less. 12/26/15  Yes Jolaine Artist, MD  Multiple Vitamin (MULTIVITAMIN WITH MINERALS)  TABS tablet Take 1 tablet by mouth daily.   Yes Historical Provider, MD  pantoprazole (PROTONIX) 40 MG tablet Take 1 tablet by mouth  daily 09/25/15  Yes [name], MD  potassium chloride SA (K-DUR,KLOR-CON) 20 MEQ tablet Take 2 tablets (40 mEq total) by mouth 2 (two) times daily. Take extra 20 meq tablet on Monday and Friday with Metolazone. 12/27/15  Yes Jolaine Artist, MD  Probiotic Product (ALIGN) 4 MG CAPS Take 4 mg by mouth every evening.   Yes Historical Provider, MD  torsemide (DEMADEX) 20 MG tablet Take 2 tablets (40 mg total) by mouth 2 (two) times daily. Patient taking differently: Take 40 mg by mouth daily.  12/27/15  Yes Jolaine Artist, MD  traMADol Veatrice Bourbon) 50 MG tablet Half to 1 tablet with food up to 3 times a day for pain 09/16/15  Yes [name], MD  dextromethorphan-guaiFENesin [facility] DM) 30-600 MG 12hr tablet Take 1 tablet by mouth 2 (two) times daily as needed for cough. Patient not taking: Reported on 01/09/2016 09/22/15   Debbe Odea, MD  diclofenac sodium (VOLTAREN) 1 % GEL Apply 3 times per day Patient not taking: Reported on 01/09/2016 08/16/15   [name], MD  dutasteride (AVODART) 0.5 MG capsule Take 1 capsule by mouth   daily Patient not taking: Reported on 01/09/2016 07/03/15   [name], MD  guaiFENesin (MUCINEX) 600 MG 12 hr tablet Take 1 tablet (600 mg total) by mouth 2 (two) times daily as needed for to loosen phlegm. Patient not taking: Reported on 01/09/2016 11/04/15   Anne Ng, PA-C    Physical Exam: Filed Vitals:   01/09/16 1222  BP: 148/64  Pulse: 58  Temp: 98.2 F (36.8 C)  TempSrc: Oral  Resp: 10  SpO2: 98%   General: Not in acute distress. Pale.  HEENT:       Eyes: PERRL, EOMI, no scleral icterus. Pale conjunctiva.       ENT: No discharge from the ears or nose, no pharyngeal ulcers, petechiae or exudate, no tonsillar enlargement.        Neck: No JVD, no bruit, no appreciable mass Heme: No cervical adenopathy, no pallor Cardiac: S1/S2, RRR, grade III systolic murmur at apex, No gallops or rubs. Pulm: Good air movement bilaterally. No rales, wheezing, rhonchi or rubs. Abd: Soft, nondistended, nontender, no rebound pain or gaurding, BS present. Ext: 1+ LE edema bilaterally. 2+DP/PT pulse bilaterally. Musculoskeletal: No gross deformity, no red, hot, swollen joints Skin: No rashes or wounds on exposed surfaces  Neuro: Alert, oriented X3, cranial nerves II-XII grossly intact. No focal findings Psych: Patient is not overtly psychotic, appropriate mood and affect.  Labs on Admission:  Basic Metabolic Panel:  Recent Labs Lab 01/03/16 1122 01/09/16 1313  NA 140 140  K 3.7 4.1  CL 100* 100*  CO2 32 32  GLUCOSE 135* 107*  BUN 48* 48*  CREATININE 1.59* 1.34*  CALCIUM 9.0 8.7*   Liver Function Tests:  Recent Labs Lab 01/09/16 1313  AST 29  ALT 16*  ALKPHOS 50  BILITOT 0.7  PROT 6.6  ALBUMIN 3.6   No results for input(s): LIPASE, AMYLASE in the last 168 hours. No results for input(s): AMMONIA in the last 168 hours. CBC:  Recent Labs Lab 01/03/16 1122 01/09/16 1313  WBC 10.4 12.1*  HGB 7.1* 6.8*  HCT 22.8* 22.2*  MCV 102.2* 106.2*  PLT 57* 54*   Cardiac  Enzymes: No results for input(s): CKTOTAL, CKMB, CKMBINDEX, TROPONINI in  the last 168 hours.  BNP (last 3 results)  Recent Labs  09/20/15 1938 11/04/15 1607 12/03/15 0130  BNP 443.0* 162.6* 240.6*    ProBNP (last 3 results) No results for input(s): PROBNP in the last 8760 hours.  CBG: No results for input(s): GLUCAP in the last 168 hours.  Radiological Exams on Admission: Dg Chest Port 1 View  01/09/2016  CLINICAL DATA:  Pre transfusion HTN, CAD, asthma, CHF, hx of aortic insufficiency and trifascicular block, never smoker. No sx EXAM: PORTABLE CHEST 1 VIEW COMPARISON:  12/03/2015 FINDINGS: Film is made with shallow lung inflation. There is elevation of right hemidiaphragm as seen on prior study. The heart size is mildly prominent. There are no focal consolidations. No pulmonary edema. IMPRESSION: 1. Shallow inflation. 2. Cardiomegaly. 3.  No evidence for acute pulmonary abnormality. Electronically Signed   By: Nolon Nations M.D.   On: 01/09/2016 14:12    EKG:  Not done in ED, will obtain as appropriate   Assessment/Plan  1. Blood loss anemia  - Hgb 6.8 on arrival, down from an apparent baseline of 9-10  - Pt is asymptomatic, has h/o PUD with bleeding, has been on Protonix 40 mg PO qD  - Suspect recurrent UGI bleed based on black stool, h/o PUD, elevaetd BUN/SCr - 2 units ordered for transfusion now, will check post-transfusion H/H  - Hold pharmacologic VTE ppx, SCD only    2. Thrombocytopenia  - Platelet count 54 today  - Has been low for years, was seeing hematology outpt but with no apparent diagnosis  - Avoid ASA, heparin products  - No sign of active blood loss, petichiae, ecchymoses - Monitor   3. CAD  - Has significant Hx of CAD, but no current symptoms despite marked anemia  - Keep O2 sat high 90s while anemic  - Continue Coreg, Plavix, Lipitor  - No symptoms, will monitor on tele for ischemic changes while anemic    4. Chronic diastolic CHF  - TTE  (123456) with EF 0000000, grade 1 diastolic dysfunction  - Appears to be euvolemic on admission  - Give blood slowly, follow second unit with x1 dose Lasix 20 mg  - SLIV, strict I/Os, daily wts, fluid-restrict diet    5. Hypertension  - At goal currently  - Cautiously continue home-dose clonidine, Lasix, Coreg  - Monitor   6. Hypothyroidism - Appears to be stable  - Continue home-dose Synthroid 50 mcg qAM    DVT ppx:  SCDs  Code Status: Full code Family Communication:  Yes, patient's son at bed side Disposition Plan: Admit to inpatient   Date of Service 01/09/2016    Vianne Bulls, MD [facility] Pager [phone]  If 7PM-7AM, please contact night-coverage [email or web address] Password [email or web address] 01/09/2016, 2:58 PM

## 2016-01-09 NOTE — ED Notes (Signed)
PT ADMITTED TO 1406-1. AAOX3. FAMILY AT THE BESIDE. PT WILL BE TRANSPORTED WITH BEDSIDE REPORT. PT IN NO APPARENT DISTRESS OR PAIN. THE OPPORTUNITY TO ASK QUESTIONS WAS PROVIDED.

## 2016-01-10 DIAGNOSIS — D696 Thrombocytopenia, unspecified: Secondary | ICD-10-CM

## 2016-01-10 DIAGNOSIS — I2583 Coronary atherosclerosis due to lipid rich plaque: Secondary | ICD-10-CM

## 2016-01-10 DIAGNOSIS — D500 Iron deficiency anemia secondary to blood loss (chronic): Secondary | ICD-10-CM

## 2016-01-10 DIAGNOSIS — I10 Essential (primary) hypertension: Secondary | ICD-10-CM

## 2016-01-10 DIAGNOSIS — N183 Chronic kidney disease, stage 3 (moderate): Secondary | ICD-10-CM

## 2016-01-10 DIAGNOSIS — I2510 Atherosclerotic heart disease of native coronary artery without angina pectoris: Secondary | ICD-10-CM | POA: Diagnosis not present

## 2016-01-10 DIAGNOSIS — I5032 Chronic diastolic (congestive) heart failure: Secondary | ICD-10-CM

## 2016-01-10 DIAGNOSIS — D539 Nutritional anemia, unspecified: Secondary | ICD-10-CM | POA: Diagnosis not present

## 2016-01-10 DIAGNOSIS — E038 Other specified hypothyroidism: Secondary | ICD-10-CM

## 2016-01-10 LAB — TYPE AND SCREEN
ABO/RH(D): O POS
ANTIBODY SCREEN: NEGATIVE
UNIT DIVISION: 0
Unit division: 0

## 2016-01-10 LAB — BASIC METABOLIC PANEL
Anion gap: 9 (ref 5–15)
BUN: 47 mg/dL — ABNORMAL HIGH (ref 6–20)
CHLORIDE: 99 mmol/L — AB (ref 101–111)
CO2: 32 mmol/L (ref 22–32)
Calcium: 8.2 mg/dL — ABNORMAL LOW (ref 8.9–10.3)
Creatinine, Ser: 1.45 mg/dL — ABNORMAL HIGH (ref 0.61–1.24)
GFR calc non Af Amer: 40 mL/min — ABNORMAL LOW (ref >60)
GFR, EST AFRICAN AMERICAN: 46 mL/min — AB (ref >60)
Glucose, Bld: 100 mg/dL — ABNORMAL HIGH (ref 65–99)
POTASSIUM: 3.6 mmol/L (ref 3.5–5.1)
SODIUM: 140 mmol/L (ref 135–145)

## 2016-01-10 LAB — CBC
HEMATOCRIT: 26.1 % — AB (ref 39.0–52.0)
HEMOGLOBIN: 8.3 g/dL — AB (ref 13.0–17.0)
MCH: 32.3 pg (ref 26.0–34.0)
MCHC: 31.8 g/dL (ref 30.0–36.0)
MCV: 101.6 fL — AB (ref 78.0–100.0)
Platelets: 45 10*3/uL — ABNORMAL LOW (ref 150–400)
RBC: 2.57 MIL/uL — AB (ref 4.22–5.81)
RDW: 20.8 % — ABNORMAL HIGH (ref 11.5–15.5)
WBC: 8.8 10*3/uL (ref 4.0–10.5)

## 2016-01-10 LAB — FOLATE RBC
Folate, Hemolysate: 618.5 ng/mL
Folate, RBC: 3062 ng/mL (ref >498)
Hematocrit: 20.2 % — ABNORMAL LOW (ref 37.5–51.0)

## 2016-01-10 LAB — APTT: APTT: 27 s (ref 24–37)

## 2016-01-10 LAB — GLUCOSE, CAPILLARY: GLUCOSE-CAPILLARY: 107 mg/dL — AB (ref 65–99)

## 2016-01-10 LAB — PROTIME-INR
INR: 1.19 (ref 0.00–1.49)
Prothrombin Time: 14.8 seconds (ref 11.6–15.2)

## 2016-01-10 NOTE — Care Management Note (Signed)
Case Management Note  Patient Details  Name: Victor Green MRN: [id number] Date of Birth: Jul 17, 1920  Subjective/Objective: 82 y/o m admitted w/Blood loss anemia. From home w/[facility]-rn/pt/ot), has home 02 & travel tank, receives Personal care services from [facility]. [facility] rep aware of Blue orders, & d/c.PCS caregiver will transp home.                   Action/Plan:d/c home w/HHC.   Expected Discharge Date:   (unknown)               Expected Discharge Plan:  [place]  In-House Referral:     Discharge planning Services  CM Consult  Post Acute Care Choice:  Resumption of Svcs/PTA Provider, Durable Medical Equipment (Personal care services-[facility]home 02) Choice offered to:     DME Arranged:    DME Agency:  [place]  HH Arranged:  RN, PT, OT [facility] Agency:     Status of Service:  Completed, signed off  Medicare Important Message Given:    Date Medicare IM Given:    Medicare IM give by:    Date Additional Medicare IM Given:    Additional Medicare Important Message give by:     If discussed at [place] of Stay Meetings, dates discussed:    Additional Comments:  Dessa Phi, RN 01/10/2016, 10:45 AM

## 2016-01-10 NOTE — Discharge Instructions (Signed)
Anemia, Nonspecific Anemia is a condition in which the concentration of red blood cells or hemoglobin in the blood is below normal. Hemoglobin is a substance in red blood cells that carries oxygen to the tissues of the body. Anemia results in not enough oxygen reaching these tissues.  CAUSES  Common causes of anemia include:   Excessive bleeding. Bleeding may be internal or external. This includes excessive bleeding from periods (in women) or from the intestine.   Poor nutrition.   Chronic kidney, thyroid, and liver disease.  Bone marrow disorders that decrease red blood cell production.  Cancer and treatments for cancer.  HIV, AIDS, and their treatments.  Spleen problems that increase red blood cell destruction.  Blood disorders.  Excess destruction of red blood cells due to infection, medicines, and autoimmune disorders. SIGNS AND SYMPTOMS   Minor weakness.   Dizziness.   Headache.  Palpitations.   Shortness of breath, especially with exercise.   Paleness.  Cold sensitivity.  Indigestion.  Nausea.  Difficulty sleeping.  Difficulty concentrating. Symptoms may occur suddenly or they may develop slowly.  DIAGNOSIS  Additional blood tests are often needed. These help your health care provider determine the best treatment. Your health care provider will check your stool for blood and look for other causes of blood loss.  TREATMENT  Treatment varies depending on the cause of the anemia. Treatment can include:   Supplements of iron, vitamin B12, or folic acid.   Hormone medicines.   A blood transfusion. This may be needed if blood loss is severe.   Hospitalization. This may be needed if there is significant continual blood loss.   Dietary changes.  Spleen removal. HOME CARE INSTRUCTIONS Keep all follow-up appointments. It often takes many weeks to correct anemia, and having your health care provider check on your condition and your response to  treatment is very important. SEEK IMMEDIATE MEDICAL CARE IF:   You develop extreme weakness, shortness of breath, or chest pain.   You become dizzy or have trouble concentrating.  You develop heavy vaginal bleeding.   You develop a rash.   You have bloody or black, tarry stools.   You faint.   You vomit up blood.   You vomit repeatedly.   You have abdominal pain.  You have a fever or persistent symptoms for more than 2-3 days.   You have a fever and your symptoms suddenly get worse.   You are dehydrated.  MAKE SURE YOU:  Understand these instructions.  Will watch your condition.  Will get help right away if you are not doing well or get worse.   This information is not intended to replace advice given to you by your health care provider. Make sure you discuss any questions you have with your health care provider.   Document Released: 11/26/2004 Document Revised: 06/21/2013 Document Reviewed: 04/14/2013 Elsevier Interactive Patient Education 2016 [employer]  

## 2016-01-10 NOTE — Care Management Obs Status (Signed)
[place] NOTIFICATION   Patient Details  Name: Victor Green MRN: [id number] Date of Birth: 06-28-1920   Medicare Observation Status Notification Given:  Yes    MahabirJuliann Pulse, RN 01/10/2016, 10:57 AM

## 2016-01-10 NOTE — Discharge Summary (Addendum)
Physician Discharge Summary  Victor Green [id number] DOB: 07/19/1920 DOA: 01/09/2016  PCP: Velna Hatchet, MD  Admit date: 01/09/2016 Discharge date: 01/10/2016  Time spent: 45 minutes  Recommendations for Outpatient Follow-up:  Patient will be discharged to home.  Resume home health services. Patient will need to follow up with primary care provider within one week of discharge, repeat CBC.  Patient should continue medications as prescribed.  Patient should follow a heart healthy diet.   Discharge Diagnoses:  Possible Blood loss anemia/chronic macrocytic anemia Chronic thrombocytopenia Coronary artery disease Chronic diastolic heart failure Essential hypertension Hypothyroidism  Discharge Condition: Stable  Diet recommendation: Heart healthy  Filed Weights   01/09/16 1604  Weight: 92.1 kg (203 lb 0.7 oz)    History of present illness:  On 01/09/2016 by Dr. Christia Reading Opyd Victor Green is a 82 y.o. male with PMH of hypertension, hypothyroidism, CAD, and chronic diastolic CHF who presents to the ED at the direction of his primary care physician for evaluation of a critically low hemoglobin. Patient has a long history of anemia and thrombocytopenia that has been evaluated by hematology in the past with no clear diagnosis. Over the past 3 months, he has been followed by his PCP for progressive macrocytic anemia. His caregiver received a phone call earlier today notifying them of a hemoglobin in the mid 6 range and recommending they go to the nearest ED for evaluation. Patient denies chest pain, palpitations, headaches, or lightheadedness. Stools have reportedly been dark, but no maroon or red blood is seen in the bowel movements. There has been no hematemesis, hemoptysis, or gross hematuria. Patient denies abdominal pain, nausea, or diarrhea. He denies any history of similar circumstances, but his son at the bedside and notes to episodes of GI bleeding which were attributed to peptic  ulcers in the past. Approximately 45 years ago, the patient was hospitalized for treatment of a bleeding peptic ulcer. Again, roughly 15 years ago, patient was apparently admitted to the hospital and treated for another bleeding peptic ulcer with identification of H. pylori association. Patient is been managed on daily Protonix 40 mg and reports feeling quite well.  In ED, patient was found to be afebrile, saturating well on 2 L/m supplemental oxygen, and with vital signs stable. Chest x-ray was obtained and negative for acute cardiopulmonary disease. CBC is notable for hemoglobin of 6.8 and hemoglobin of 12.1. Chemistry panel features a serum creatinine of 1.34, up from an apparent baseline of 1.1. BUN is elevated to a value of 48. 2 units of packed red blood cells were ordered from the ED for immediate transfusion, the patient remained hemodynamically stable, and he will be admitted to the telemetry unit for ongoing evaluation and management of GI blood loss anemia.   Hospital Course:  Possible Blood loss Anemia/Chronic macrocytic anemia -Possibly secondary to PUD, however, FOBT negative -Patient was found to have hb 6.8 upon admission.  -Baseline hemoglobin 8-9 -Given 2uPRBCs, Hemoglobin now 8.3 -Anemia panel: Iron 107, ferritin 1090, B12 3027 -Repeat CBC in one week  Thrombocytopenia, chronic -Patient in following up with hematology -No active signs of blood loss, petechiae, ecchymosis -Continue outpatient follow-up  Coronary artery disease -Currently chest pain-free -Continue Coreg, Plavix, Lipitor  Chronic diastolic heart failure -Echocardiogram 09/22/2015 showed EF of 55-60%, grade 1 as Dr. Dysfunction -Currently appears to be euvolemic -Continue demadex   Essential hypertension -Continue  demadex, Coreg, clonidine  Chronic kidney disease, stage III -Stable  Hypothyroidism -Continue Synthroid  Procedures:  None  Consultations:  None  Discharge Exam: Filed Vitals:     01/10/16 0035 01/10/16 0602  BP: 118/54 122/52  Pulse: 61 55  Temp: 98.4 F (36.9 C) 98.3 F (36.8 C)  Resp: 16 16     General: Well developed, well nourished, NAD, appears stated age  HEENT: NCAT, mucous membranes moist.  Cardiovascular: S1 S2 auscultated, 3/6 SEM, RRR  Respiratory: Clear to auscultation bilaterally   Abdomen: Soft, nontender, nondistended, + bowel sounds  Extremities: warm dry without cyanosis clubbing. Trace LE edema B/L  Neuro: AAOx3, nonfocal  Psych: Normal affect and demeanor, pleasant  Discharge Instructions      Discharge Instructions    Discharge instructions    Complete by:  As directed   Patient will be discharged to home.  Resume home health services. Patient will need to follow up with primary care provider within one week of discharge, repeat CBC.  Patient should continue medications as prescribed.  Patient should follow a heart healthy diet.            Medication List    STOP taking these medications        B-COMPLEX PO     dextromethorphan-guaiFENesin 30-600 MG 12hr tablet  Commonly known as:  MUCINEX DM     diclofenac sodium 1 % Gel  Commonly known as:  VOLTAREN     dutasteride 0.5 MG capsule  Commonly known as:  AVODART     guaiFENesin 600 MG 12 hr tablet  Commonly known as:  MUCINEX      TAKE these medications        acetaminophen 325 MG tablet  Commonly known as:  TYLENOL  Take 325 mg by mouth every morning.     ALIGN 4 MG Caps  Take 4 mg by mouth every evening.     atorvastatin 10 MG tablet  Commonly known as:  LIPITOR  Take 1 tablet by mouth  daily     azelastine 0.1 % nasal spray  Commonly known as:  ASTELIN  Place 1 spray into both nostrils 2 (two) times daily. Use in each nostril as directed     brimonidine 0.15 % ophthalmic solution  Commonly known as:  ALPHAGAN  Place 1 drop into the left eye 2 (two) times daily.     carvedilol 6.25 MG tablet  Commonly known as:  COREG  Take 1 tablet (6.25  mg total) by mouth 2 (two) times daily with a meal.     cloNIDine 0.1 MG tablet  Commonly known as:  CATAPRES  Take 0.1 mg by mouth 3 (three) times daily as needed. BP.     diazepam 2 MG tablet  Commonly known as:  VALIUM  Take 0.5 tablets (1 mg total) by mouth at bedtime.     dorzolamide 2 % ophthalmic solution  Commonly known as:  TRUSOPT  Place 1 drop into the left eye 2 (two) times daily.     EPIPEN 2-PAK 0.3 mg/0.3 mL Soaj injection  Generic drug:  EPINEPHrine  Inject 0.72ml (0.3mg ) into  the muscle once as directed     ferrous sulfate 325 (65 FE) MG tablet  Take 325 mg by mouth every evening.     fexofenadine 30 MG tablet  Commonly known as:  ALLEGRA  Take 45 mg by mouth daily.     fluticasone 50 MCG/ACT nasal spray  Commonly known as:  FLONASE  Use 2 sprays in each  nostril twice daily     glucosamine-chondroitin 500-400 MG tablet  Take 1 tablet by mouth 2 (two) times daily.     hydrALAZINE 25 MG tablet  Commonly known as:  APRESOLINE  Take 1 tablet (25 mg total) by mouth 3 (three) times daily.     levothyroxine 50 MCG tablet  Commonly known as:  SYNTHROID, LEVOTHROID  Take 1 tablet by mouth  daily     metolazone 2.5 MG tablet  Commonly known as:  ZAROXOLYN  Take 1 tablet by mouth every Monday and Friday.  Hold if weight is 200lbs or less.     multivitamin with minerals Tabs tablet  Take 1 tablet by mouth daily.     pantoprazole 40 MG tablet  Commonly known as:  PROTONIX  Take 1 tablet by mouth  daily     potassium chloride SA 20 MEQ tablet  Commonly known as:  K-DUR,KLOR-CON  Take 2 tablets (40 mEq total) by mouth 2 (two) times daily. Take extra 20 meq tablet on Monday and Friday with Metolazone.     torsemide 20 MG tablet  Commonly known as:  DEMADEX  Take 2 tablets (40 mg total) by mouth 2 (two) times daily.     traMADol 50 MG tablet  Commonly known as:  ULTRAM  Half to 1 tablet with food up to 3 times a day for pain       Allergies  Allergen  Reactions  . Other Anaphylaxis    Horse serum  . Tetanus Toxoids Other (See Comments)   Follow-up Information    Follow up with HOLWERDA, SCOTT, MD. Schedule an appointment as soon as possible for a visit in 1 week.   Specialty:  Internal Medicine   Why:  Hospital follow up   Contact information:   [address] [place] [address] [phone]        The results of significant diagnostics from this hospitalization (including imaging, microbiology, ancillary and laboratory) are listed below for reference.    Significant Diagnostic Studies: Dg Chest Port 1 View  01/09/2016  CLINICAL DATA:  Pre transfusion HTN, CAD, asthma, CHF, hx of aortic insufficiency and trifascicular block, never smoker. No sx EXAM: PORTABLE CHEST 1 VIEW COMPARISON:  12/03/2015 FINDINGS: Film is made with shallow lung inflation. There is elevation of right hemidiaphragm as seen on prior study. The heart size is mildly prominent. There are no focal consolidations. No pulmonary edema. IMPRESSION: 1. Shallow inflation. 2. Cardiomegaly. 3.  No evidence for acute pulmonary abnormality. Electronically Signed   By: Nolon Nations M.D.   On: 01/09/2016 14:12    Microbiology: No results found for this or any previous visit (from the past 240 hour(s)).   Labs: Basic Metabolic Panel:  Recent Labs Lab 01/03/16 1122 01/09/16 1313 01/10/16 0446  NA 140 140 140  K 3.7 4.1 3.6  CL 100* 100* 99*  CO2 32 32 32  GLUCOSE 135* 107* 100*  BUN 48* 48* 47*  CREATININE 1.59* 1.34* 1.45*  CALCIUM 9.0 8.7* 8.2*   Liver Function Tests:  Recent Labs Lab 01/09/16 1313  AST 29  ALT 16*  ALKPHOS 50  BILITOT 0.7  PROT 6.6  ALBUMIN 3.6   No results for input(s): LIPASE, AMYLASE in the last 168 hours. No results for input(s): AMMONIA in the last 168 hours. CBC:  Recent Labs Lab 01/03/16 1122 01/09/16 1313 01/10/16 0446  WBC 10.4 12.1* 8.8  HGB 7.1* 6.8* 8.3*  HCT 22.8* 22.2* 26.1*  MCV 102.2* 106.2* 101.6*    PLT 57* 54* 45*   Cardiac  Enzymes: No results for input(s): CKTOTAL, CKMB, CKMBINDEX, TROPONINI in the last 168 hours. BNP: BNP (last 3 results)  Recent Labs  09/20/15 1938 11/04/15 1607 12/03/15 0130  BNP 443.0* 162.6* 240.6*    ProBNP (last 3 results) No results for input(s): PROBNP in the last 8760 hours.  CBG:  Recent Labs Lab 01/10/16 0735  GLUCAP 107*       Signed:  Cristal Ford  [facility] 01/10/2016, 10:37 AM

## 2016-01-11 NOTE — ED Provider Notes (Signed)
CSN: [id number]     Arrival date & time 01/09/16  1200 History   First MD Initiated Contact with Patient 01/09/16 1240     Chief Complaint  Patient presents with  . Hemoglobin 6      (Consider location/radiation/quality/duration/timing/severity/associated sxs/prior Treatment) HPI Comments: Pt with CHF, Aortic insufficiency comes in with cc of abnormal Hb. PCP following the Hb closely, and it has dropped from 9 to 6.5 over the course of last few weeks. Pt getting tired more easily, but there is not chest pain, dizziness, near fainting. Denies blood loss. Seems like there is hx of blood loss.   The history is provided by the patient.    Past Medical History  Diagnosis Date  . Hypertension   . CAD (coronary artery disease) complete w/u in 2009    extensive coronary calcification by EBCT, normal myoview, echo with EF 70% mild AI  . Hyperlipidemia   . Aortic insufficiency     echo July 2011 EF 60%  . 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 History of GI bleed 2003    due to pyloric-positive ulcer   . Diverticulosis     with remote history of diverticulitis  . Plantar fasciitis     chronic  . Allergic rhinitis     responding well to desensitation injections  . Glaucoma   . Osteoarthritis     both knees  . Asthma   . CHF (congestive heart failure) [facility])    Past Surgical History  Procedure Laterality Date  . Tonsillectomy    . Hemorrhoid surgery  1996  . Hernia repair  1996    right  . Lumbar laminectomy  2003  . Cataract extraction      left eye   Family History  Problem Relation Age of Onset  . Thrombosis Father     mesenteric artery thrombosis after prostate surgery  . Colon cancer Mother    Social History  Substance Use Topics  . Smoking status: Never Smoker   . Smokeless tobacco: None  . Alcohol Use: No    Review of Systems  All other systems reviewed and are negative.     Allergies  Other and Tetanus toxoids  Home Medications    Prior to Admission medications   Medication Sig Start Date End Date Taking? Authorizing Provider  acetaminophen (TYLENOL) 325 MG tablet Take 325 mg by mouth every morning.    Yes Historical Provider, MD  atorvastatin (LIPITOR) 10 MG tablet Take 1 tablet by mouth  daily 07/09/15  Yes [name], MD  azelastine (ASTELIN) 0.1 % nasal spray Place 1 spray into both nostrils 2 (two) times daily. Use in each nostril as directed 08/20/14  Yes [name], MD  brimonidine (ALPHAGAN) 0.15 % ophthalmic solution Place 1 drop into the left eye 2 (two) times daily.    Yes Historical Provider, MD  carvedilol (COREG) 6.25 MG tablet Take 1 tablet (6.25 mg total) by mouth 2 (two) times daily with a meal. 09/22/15  Yes Debbe Odea, MD  cloNIDine (CATAPRES) 0.1 MG tablet Take 0.1 mg by mouth 3 (three) times daily as needed. BP. 11/22/15  Yes Historical Provider, MD  diazepam (VALIUM) 2 MG tablet Take 0.5 tablets (1 mg total) by mouth at bedtime. 10/11/15  Yes [name], MD  dorzolamide (TRUSOPT) 2 % ophthalmic solution Place 1 drop into the left eye 2 (two) times daily.    Yes Historical Provider, MD  EPIPEN 2-PAK  0.3 MG/0.3ML SOAJ injection Inject 0.31ml (0.3mg ) into  the muscle once as directed 12/26/14  Yes [name], MD  ferrous sulfate 325 (65 FE) MG tablet Take 325 mg by mouth every evening.    Yes Historical Provider, MD  fexofenadine (ALLEGRA) 30 MG tablet Take 45 mg by mouth daily.   Yes Historical Provider, MD  fluticasone (FLONASE) 50 MCG/ACT nasal spray Use 2 sprays in each  nostril twice daily 08/21/15  Yes [name], MD  glucosamine-chondroitin 500-400 MG tablet Take 1 tablet by mouth 2 (two) times daily.    Yes Historical Provider, MD  hydrALAZINE (APRESOLINE) 25 MG tablet Take 1 tablet (25 mg total) by mouth 3 (three) times daily. 12/09/15  Yes Ripudeep Krystal Eaton, MD  levothyroxine (SYNTHROID, LEVOTHROID) 50 MCG tablet Take 1 tablet by mouth  daily 07/01/15  Yes [name], MD  metolazone (ZAROXOLYN) 2.5 MG  tablet Take 1 tablet by mouth every Monday and Friday.  Hold if weight is 200lbs or less. 12/26/15  Yes Jolaine Artist, MD  Multiple Vitamin (MULTIVITAMIN WITH MINERALS) TABS tablet Take 1 tablet by mouth daily.   Yes Historical Provider, MD  pantoprazole (PROTONIX) 40 MG tablet Take 1 tablet by mouth  daily 09/25/15  Yes [name], MD  potassium chloride SA (K-DUR,KLOR-CON) 20 MEQ tablet Take 2 tablets (40 mEq total) by mouth 2 (two) times daily. Take extra 20 meq tablet on Monday and Friday with Metolazone. 12/27/15  Yes Jolaine Artist, MD  Probiotic Product (ALIGN) 4 MG CAPS Take 4 mg by mouth every evening.   Yes Historical Provider, MD  torsemide (DEMADEX) 20 MG tablet Take 2 tablets (40 mg total) by mouth 2 (two) times daily. Patient taking differently: Take 40 mg by mouth daily.  12/27/15  Yes Jolaine Artist, MD  traMADol Veatrice Bourbon) 50 MG tablet Half to 1 tablet with food up to 3 times a day for pain 09/16/15  Yes [name], MD   BP 122/52 mmHg  Pulse 55  Temp(Src) 98.3 F (36.8 C) (Oral)  Resp 16  Ht 5\' 11"  (1.803 m)  Wt 203 lb 0.7 oz (92.1 kg)  BMI 28.33 kg/m2  SpO2 97% Physical Exam  Constitutional: He is oriented to person, place, and time. He appears well-developed.  HENT:  Head: Atraumatic.  Neck: Neck supple.  Cardiovascular: Normal rate.   Murmur heard. Pulmonary/Chest: Effort normal.  Abdominal: There is no tenderness.  Neurological: He is alert and oriented to person, place, and time.  Skin: Skin is warm.  Nursing note and vitals reviewed.   ED Course  Procedures (including critical care time) Labs Review Labs Reviewed  COMPREHENSIVE METABOLIC PANEL - Abnormal; Notable for the following:    Chloride 100 (*)    Glucose, Bld 107 (*)    BUN 48 (*)    Creatinine, Ser 1.34 (*)    Calcium 8.7 (*)    ALT 16 (*)    GFR calc non Af Amer 44 (*)    GFR calc Af Amer 51 (*)    All other components within normal limits  CBC - Abnormal; Notable for the  following:    WBC 12.1 (*)    RBC 2.09 (*)    Hemoglobin 6.8 (*)    HCT 22.2 (*)    MCV 106.2 (*)    RDW 20.4 (*)    Platelets 54 (*)    All other components within normal limits  FERRITIN - Abnormal; Notable for the following:  Ferritin 1090 (*)    All other components within normal limits  IRON AND TIBC - Abnormal; Notable for the following:    Saturation Ratios 40 (*)    All other components within normal limits  VITAMIN B12 - Abnormal; Notable for the following:    Vitamin B-12 3027 (*)    All other components within normal limits  FOLATE RBC - Abnormal; Notable for the following:    Hematocrit 20.2 (*)    All other components within normal limits  BASIC METABOLIC PANEL - Abnormal; Notable for the following:    Chloride 99 (*)    Glucose, Bld 100 (*)    BUN 47 (*)    Creatinine, Ser 1.45 (*)    Calcium 8.2 (*)    GFR calc non Af Amer 40 (*)    GFR calc Af Amer 46 (*)    All other components within normal limits  CBC - Abnormal; Notable for the following:    RBC 2.57 (*)    Hemoglobin 8.3 (*)    HCT 26.1 (*)    MCV 101.6 (*)    RDW 20.8 (*)    Platelets 45 (*)    All other components within normal limits  GLUCOSE, CAPILLARY - Abnormal; Notable for the following:    Glucose-Capillary 107 (*)    All other components within normal limits  PROTIME-INR  APTT  POC OCCULT BLOOD, ED  TYPE AND SCREEN  PREPARE RBC (CROSSMATCH)    Imaging Review Dg Chest Port 1 View  01/09/2016  CLINICAL DATA:  Pre transfusion HTN, CAD, asthma, CHF, hx of aortic insufficiency and trifascicular block, never smoker. No sx EXAM: PORTABLE CHEST 1 VIEW COMPARISON:  12/03/2015 FINDINGS: Film is made with shallow lung inflation. There is elevation of right hemidiaphragm as seen on prior study. The heart size is mildly prominent. There are no focal consolidations. No pulmonary edema. IMPRESSION: 1. Shallow inflation. 2. Cardiomegaly. 3.  No evidence for acute pulmonary abnormality. Electronically  Signed   By: Nolon Nations M.D.   On: 01/09/2016 14:12   I have personally reviewed and evaluated these images and lab results as part of my medical decision-making.   EKG Interpretation None      MDM   Final diagnoses:  Symptomatic anemia    Pt with slowly worsening Hb. He is getting tired more easily - which is the only symptom present. Asymptomatic at rest. Will need admission and transfusion.    Varney Biles, MD 01/11/16 [phone]

## 2016-01-23 ENCOUNTER — Encounter (HOSPITAL_COMMUNITY): Payer: Self-pay | Admitting: *Deleted

## 2016-01-23 ENCOUNTER — Emergency Department (HOSPITAL_COMMUNITY)
Admission: EM | Admit: 2016-01-23 | Discharge: 2016-01-23 | Disposition: A | Discharge status: Home / Self Care | Payer: Medicare Other | Attending: Emergency Medicine | Admitting: Emergency Medicine

## 2016-01-23 DIAGNOSIS — H409 Unspecified glaucoma: Secondary | ICD-10-CM | POA: Diagnosis not present

## 2016-01-23 DIAGNOSIS — M170 Bilateral primary osteoarthritis of knee: Secondary | ICD-10-CM | POA: Insufficient documentation

## 2016-01-23 DIAGNOSIS — Z7951 Long term (current) use of inhaled steroids: Secondary | ICD-10-CM | POA: Diagnosis not present

## 2016-01-23 DIAGNOSIS — D649 Anemia, unspecified: Secondary | ICD-10-CM

## 2016-01-23 DIAGNOSIS — Z79899 Other long term (current) drug therapy: Secondary | ICD-10-CM | POA: Insufficient documentation

## 2016-01-23 DIAGNOSIS — R5383 Other fatigue: Secondary | ICD-10-CM | POA: Diagnosis present

## 2016-01-23 DIAGNOSIS — Z8719 Personal history of other diseases of the digestive system: Secondary | ICD-10-CM | POA: Diagnosis not present

## 2016-01-23 DIAGNOSIS — J45909 Unspecified asthma, uncomplicated: Secondary | ICD-10-CM | POA: Diagnosis not present

## 2016-01-23 DIAGNOSIS — I2510 Atherosclerotic heart disease of native coronary artery without angina pectoris: Secondary | ICD-10-CM | POA: Diagnosis not present

## 2016-01-23 DIAGNOSIS — E785 Hyperlipidemia, unspecified: Secondary | ICD-10-CM | POA: Diagnosis not present

## 2016-01-23 DIAGNOSIS — Z8739 Personal history of other diseases of the musculoskeletal system and connective tissue: Secondary | ICD-10-CM | POA: Insufficient documentation

## 2016-01-23 DIAGNOSIS — I509 Heart failure, unspecified: Secondary | ICD-10-CM | POA: Insufficient documentation

## 2016-01-23 DIAGNOSIS — I10 Essential (primary) hypertension: Secondary | ICD-10-CM | POA: Diagnosis not present

## 2016-01-23 LAB — PREPARE RBC (CROSSMATCH)

## 2016-01-23 LAB — URINALYSIS, ROUTINE W REFLEX MICROSCOPIC
BILIRUBIN URINE: NEGATIVE
GLUCOSE, UA: NEGATIVE mg/dL
HGB URINE DIPSTICK: NEGATIVE
Ketones, ur: NEGATIVE mg/dL
Leukocytes, UA: NEGATIVE
Nitrite: NEGATIVE
Protein, ur: NEGATIVE mg/dL
SPECIFIC GRAVITY, URINE: 1.011 (ref 1.005–1.030)
pH: 6 (ref 5.0–8.0)

## 2016-01-23 LAB — CBC WITH DIFFERENTIAL/PLATELET
BASOS ABS: 0.1 10*3/uL (ref 0.0–0.1)
Basophils Relative: 1 %
EOS ABS: 0 10*3/uL (ref 0.0–0.7)
Eosinophils Relative: 0 %
HCT: 26.9 % — ABNORMAL LOW (ref 39.0–52.0)
HEMOGLOBIN: 8.4 g/dL — AB (ref 13.0–17.0)
LYMPHS PCT: 32 %
Lymphs Abs: 3.6 10*3/uL (ref 0.7–4.0)
MCH: 31.7 pg (ref 26.0–34.0)
MCHC: 31.2 g/dL (ref 30.0–36.0)
MCV: 101.5 fL — ABNORMAL HIGH (ref 78.0–100.0)
MONOS PCT: 16 %
Monocytes Absolute: 1.8 10*3/uL — ABNORMAL HIGH (ref 0.1–1.0)
NEUTROS PCT: 51 %
Neutro Abs: 5.9 10*3/uL (ref 1.7–7.7)
PLATELETS: 47 10*3/uL — AB (ref 150–400)
RBC: 2.65 MIL/uL — AB (ref 4.22–5.81)
RDW: 19.7 % — ABNORMAL HIGH (ref 11.5–15.5)
WBC: 11.4 10*3/uL — AB (ref 4.0–10.5)

## 2016-01-23 LAB — COMPREHENSIVE METABOLIC PANEL
ALBUMIN: 3.5 g/dL (ref 3.5–5.0)
ALK PHOS: 55 U/L (ref 38–126)
ALT: 20 U/L (ref 17–63)
AST: 30 U/L (ref 15–41)
Anion gap: 9 (ref 5–15)
BUN: 45 mg/dL — ABNORMAL HIGH (ref 6–20)
CALCIUM: 9.3 mg/dL (ref 8.9–10.3)
CHLORIDE: 104 mmol/L (ref 101–111)
CO2: 31 mmol/L (ref 22–32)
CREATININE: 1.54 mg/dL — AB (ref 0.61–1.24)
GFR calc Af Amer: 43 mL/min — ABNORMAL LOW (ref >60)
GFR calc non Af Amer: 37 mL/min — ABNORMAL LOW (ref >60)
GLUCOSE: 110 mg/dL — AB (ref 65–99)
Potassium: 4.1 mmol/L (ref 3.5–5.1)
SODIUM: 144 mmol/L (ref 135–145)
Total Bilirubin: 0.8 mg/dL (ref 0.3–1.2)
Total Protein: 6.7 g/dL (ref 6.5–8.1)

## 2016-01-23 LAB — POC OCCULT BLOOD, ED: FECAL OCCULT BLD: NEGATIVE

## 2016-01-23 MED ORDER — SODIUM CHLORIDE 0.9 % IV SOLN
Freq: Once | INTRAVENOUS | Status: AC
  Administered 2016-01-23: 17:00:00 via INTRAVENOUS

## 2016-01-23 NOTE — ED Notes (Signed)
Pt is here to be evaluated for low hemoglobin. Pt hgb was 9.1 on Monday and is 7.8 today and was sent to ED by PCP. Pt has 24 hour care and staff is here with him.

## 2016-01-23 NOTE — Discharge Instructions (Signed)
 Blood Transfusion  A blood transfusion is a procedure in which you receive donated blood through an IV tube. You may need a blood transfusion because of illness, surgery, or injury. The blood may come from a donor, or it may be your own blood that you donated previously. The blood given in a transfusion is made up of different types of cells. You may receive:  Red blood cells. These carry oxygen and replace lost blood.  Platelets. These control bleeding.  Plasma. Thishelps blood to clot. If you have hemophilia or another clotting disorder, you may also receive other types of blood products. LET YOUR HEALTH CARE PROVIDER KNOW ABOUT:  Any allergies you have.  All medicines you are taking, including vitamins, herbs, eye drops, creams, and over-the-counter medicines.  Previous problems you or members of your family have had with the use of anesthetics.  Any blood disorders you have.  Previous surgeries you have had.  Any medical conditions you may have.  Any previous reactions you have had during a blood transfusion.  RISKS AND COMPLICATIONS Generally, this is a safe procedure. However, problems may occur, including:  Having an allergic reaction to something in the donated blood.  Fever. This may be a reaction to the white blood cells in the transfused blood.  Iron overload. This can happen from having many transfusions.  Transfusion-related acute lung injury (TRALI). This is a rare reaction that causes lung damage. The cause is not known.TRALI can occur within hours of a transfusion or several days later.  Sudden (acute) or delayed hemolytic reactions. This happens if your blood does not match the cells in your transfusion. Your body's defense system (immune system) may try to attack the new cells. This complication is rare.  Infection. This is rare. BEFORE THE PROCEDURE  You may have a blood test to determine your blood type. This is necessary to know what kind of blood  your body will accept.  If you are going to have a planned surgery, you may donate your own blood. This may be done in case you need to have a transfusion.  If you have had an allergic reaction to a transfusion in the past, you may be given medicine to help prevent a reaction. Take this medicine only as directed by your health care provider.  You will have your temperature, blood pressure, and pulse monitored before the transfusion. PROCEDURE   An IV will be started in your hand or arm.  The bag of donated blood will be attached to your IV tube and given into your vein.  Your temperature, blood pressure, and pulse will be monitored regularly during the transfusion. This monitoring is done to detect early signs of a transfusion reaction.  If you have any signs or symptoms of a reaction, your transfusion will be stopped and you may be given medicine.  When the transfusion is over, your IV will be removed.  Pressure may be applied to the IV site for a few minutes.  A bandage (dressing) will be applied. The procedure may vary among health care providers and hospitals. AFTER THE PROCEDURE  Your blood pressure, temperature, and pulse will be monitored regularly.   This information is not intended to replace advice given to you by your health care provider. Make sure you discuss any questions you have with your health care provider.   Document Released: 10/16/2000 Document Revised: 11/09/2014 Document Reviewed: 08/29/2014 Elsevier Interactive Patient Education 2016 [employer]   Anemia, Nonspecific Anemia is a   which the concentration of red blood cells or hemoglobin in the blood is below normal. Hemoglobin is a substance in red blood cells that carries oxygen to the tissues of the body. Anemia results in not enough oxygen reaching these tissues.  CAUSES  Common causes of anemia include:   Excessive bleeding. Bleeding may be internal or external. This includes excessive  bleeding from periods (in women) or from the intestine.   Poor nutrition.   Chronic kidney, thyroid, and liver disease.  Bone marrow disorders that decrease red blood cell production.  Cancer and treatments for cancer.  HIV, AIDS, and their treatments.  Spleen problems that increase red blood cell destruction.  Blood disorders.  Excess destruction of red blood cells due to infection, medicines, and autoimmune disorders. SIGNS AND SYMPTOMS   Minor weakness.   Dizziness.   Headache.  Palpitations.   Shortness of breath, especially with exercise.   Paleness.  Cold sensitivity.  Indigestion.  Nausea.  Difficulty sleeping.  Difficulty concentrating. Symptoms may occur suddenly or they may develop slowly.  DIAGNOSIS  Additional blood tests are often needed. These help your health care provider determine the best treatment. Your health care provider will check your stool for blood and look for other causes of blood loss.  TREATMENT  Treatment varies depending on the cause of the anemia. Treatment can include:   Supplements of iron, vitamin 123456, or folic acid.   Hormone medicines.   A blood transfusion. This may be needed if blood loss is severe.   Hospitalization. This may be needed if there is significant continual blood loss.   Dietary changes.  Spleen removal. HOME CARE INSTRUCTIONS Keep all follow-up appointments. It often takes many weeks to correct anemia, and having your health care provider check on your condition and your response to treatment is very important. SEEK IMMEDIATE MEDICAL CARE IF:   You develop extreme weakness, shortness of breath, or chest pain.   You become dizzy or have trouble concentrating.  You develop heavy vaginal bleeding.   You develop a rash.   You have bloody or black, tarry stools.   You faint.   You vomit up blood.   You vomit repeatedly.   You have abdominal pain.  You have a fever or  persistent symptoms for more than 2-3 days.   You have a fever and your symptoms suddenly get worse.   You are dehydrated.  MAKE SURE YOU:  Understand these instructions.  Will watch your condition.  Will get help right away if you are not doing well or get worse.   This information is not intended to replace advice given to you by your health care provider. Make sure you discuss any questions you have with your health care provider.  Follow-up with hematology as soon as possible. Use a number and address listed in the attachments to call and make an appointment as soon as possible. Return to the emergency department if you experience severe worsening of her symptoms, fever, chills, weakness, shortness of breath, loss of consciousness, blood in your stool, increased spontaneous bleeding and bruising.

## 2016-01-23 NOTE — ED Notes (Signed)
Pt given ginger ale.

## 2016-01-23 NOTE — ED Provider Notes (Signed)
CSN: [id number]     Arrival date & time 01/23/16  1049 History   First MD Initiated Contact with Patient 01/23/16 1126     Chief Complaint  Patient presents with  . Fatigue     (Consider location/radiation/quality/duration/timing/severity/associated sxs/prior Treatment) HPI   Victor Green is a 82 year old male with a past medical history of HTN, CAD, chronic thrombocytopenia, chronic anemia who presents to the emergency room today to be evaluated for possible anemia. Patient was seen in the ED 2 weeks ago and was found to have hemoglobin of 6.8. At that time he received 2 unit blood transfusion and his posttransfusion hemoglobin was 8.3. At discharge patient was instructed to follow-up with hematology and gastroenterology but has not done so. Patient has been asymptomatic, no shortness of breath, no increased fatigue or weakness. Patient's home health nurse drew his blood 2 days ago and had a hemoglobin of 9.1. He was seen by his PCP today and was told his hemoglobin was 7.8 so he was sent to the ED for further evaluation. Denies melena, hematochezia, abdominal pain, bleeding or bruising.  Of note, patient has history of ruptured peptic ulcer 45 years ago and again 20 years ago with associated H. pylori. Patient has not had endoscopy since that time. Per patient's son he has not seen a hematologist before despite his very long history of chronic anemia and thrombocytopenia.   Past Medical History  Diagnosis Date  . Hypertension   . CAD (coronary artery disease) complete w/u in 2009    extensive coronary calcification by EBCT, normal myoview, echo with EF 70% mild AI  . Hyperlipidemia   . Aortic insufficiency     echo July 2011 EF 60%  . 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 History of GI bleed 2003    due to pyloric-positive ulcer   . Diverticulosis     with remote history of diverticulitis  . Plantar fasciitis     chronic  . Allergic rhinitis     responding well  to desensitation injections  . Glaucoma   . Osteoarthritis     both knees  . Asthma   . CHF (congestive heart failure) [facility])    Past Surgical History  Procedure Laterality Date  . Tonsillectomy    . Hemorrhoid surgery  1996  . Hernia repair  1996    right  . Lumbar laminectomy  2003  . Cataract extraction      left eye   Family History  Problem Relation Age of Onset  . Thrombosis Father     mesenteric artery thrombosis after prostate surgery  . Colon cancer Mother    Social History  Substance Use Topics  . Smoking status: Never Smoker   . Smokeless tobacco: None  . Alcohol Use: No    Review of Systems    Allergies  Other and Tetanus toxoids  Home Medications   Prior to Admission medications   Medication Sig Start Date End Date Taking? Authorizing Provider  acetaminophen (TYLENOL) 325 MG tablet Take 325 mg by mouth every morning.     Historical Provider, MD  atorvastatin (LIPITOR) 10 MG tablet Take 1 tablet by mouth  daily 07/09/15   [name], MD  azelastine (ASTELIN) 0.1 % nasal spray Place 1 spray into both nostrils 2 (two) times daily. Use in each nostril as directed 08/20/14   [name], MD  brimonidine (ALPHAGAN) 0.15 % ophthalmic solution Place 1 drop into the  left eye 2 (two) times daily.     Historical Provider, MD  carvedilol (COREG) 6.25 MG tablet Take 1 tablet (6.25 mg total) by mouth 2 (two) times daily with a meal. 09/22/15   Debbe Odea, MD  cloNIDine (CATAPRES) 0.1 MG tablet Take 0.1 mg by mouth 3 (three) times daily as needed. BP. 11/22/15   Historical Provider, MD  diazepam (VALIUM) 2 MG tablet Take 0.5 tablets (1 mg total) by mouth at bedtime. 10/11/15   [name], MD  dorzolamide (TRUSOPT) 2 % ophthalmic solution Place 1 drop into the left eye 2 (two) times daily.     Historical Provider, MD  EPIPEN 2-PAK 0.3 MG/0.3ML SOAJ injection Inject 0.74ml (0.3mg ) into  the muscle once as directed 12/26/14   [name], MD  ferrous sulfate 325 (65 FE) MG  tablet Take 325 mg by mouth every evening.     Historical Provider, MD  fexofenadine (ALLEGRA) 30 MG tablet Take 45 mg by mouth daily.    Historical Provider, MD  fluticasone Asencion Islam) 50 MCG/ACT nasal spray Use 2 sprays in each  nostril twice daily 08/21/15   [name], MD  glucosamine-chondroitin 500-400 MG tablet Take 1 tablet by mouth 2 (two) times daily.     Historical Provider, MD  hydrALAZINE (APRESOLINE) 25 MG tablet Take 1 tablet (25 mg total) by mouth 3 (three) times daily. 12/09/15   Ripudeep Krystal Eaton, MD  levothyroxine (SYNTHROID, LEVOTHROID) 50 MCG tablet Take 1 tablet by mouth  daily 07/01/15   [name], MD  metolazone (ZAROXOLYN) 2.5 MG tablet Take 1 tablet by mouth every Monday and Friday.  Hold if weight is 200lbs or less. 12/26/15   Jolaine Artist, MD  Multiple Vitamin (MULTIVITAMIN WITH MINERALS) TABS tablet Take 1 tablet by mouth daily.    Historical Provider, MD  pantoprazole (PROTONIX) 40 MG tablet Take 1 tablet by mouth  daily 09/25/15   [name], MD  potassium chloride SA (K-DUR,KLOR-CON) 20 MEQ tablet Take 2 tablets (40 mEq total) by mouth 2 (two) times daily. Take extra 20 meq tablet on Monday and Friday with Metolazone. 12/27/15   Jolaine Artist, MD  Probiotic Product (ALIGN) 4 MG CAPS Take 4 mg by mouth every evening.    Historical Provider, MD  torsemide (DEMADEX) 20 MG tablet Take 2 tablets (40 mg total) by mouth 2 (two) times daily. Patient taking differently: Take 40 mg by mouth daily.  12/27/15   Jolaine Artist, MD  traMADol Veatrice Bourbon) 50 MG tablet Half to 1 tablet with food up to 3 times a day for pain 09/16/15   [name], MD   BP 146/68 mmHg  Pulse 60  Temp(Src) 97.8 F (36.6 C) (Oral)  Resp 14  SpO2 100% Physical Exam  Constitutional: He is oriented to person, place, and time. He appears well-developed and well-nourished. No distress.  HENT:  Head: Normocephalic and atraumatic.  Mouth/Throat: No oropharyngeal exudate.  Eyes: Conjunctivae and EOM  are normal. Pupils are equal, round, and reactive to light. Right eye exhibits no discharge. Left eye exhibits no discharge. No scleral icterus.  Neck: Neck supple.  Cardiovascular: Normal rate, regular rhythm, normal heart sounds and intact distal pulses.  Exam reveals no gallop and no friction rub.   No murmur heard. Pulmonary/Chest: Effort normal and breath sounds normal. No respiratory distress. He has no wheezes. He has no rales. He exhibits no tenderness.  Abdominal: Soft. He exhibits no distension. There is no tenderness. There is no guarding.  Genitourinary: Rectum normal. Guaiac negative stool.  Musculoskeletal: Normal range of motion. He exhibits no edema.  Lymphadenopathy:    He has no cervical adenopathy.  Neurological: He is alert and oriented to person, place, and time.  Skin: Skin is warm and dry. No rash noted. He is not diaphoretic. No erythema. No pallor.  Psychiatric: He has a normal mood and affect. His behavior is normal.  Nursing note and vitals reviewed.   ED Course  Procedures (including critical care time) Labs Review Labs Reviewed  CBC WITH DIFFERENTIAL/PLATELET - Abnormal; Notable for the following:    WBC 11.4 (*)    RBC 2.65 (*)    Hemoglobin 8.4 (*)    HCT 26.9 (*)    MCV 101.5 (*)    RDW 19.7 (*)    Monocytes Absolute 1.8 (*)    All other components within normal limits  COMPREHENSIVE METABOLIC PANEL - Abnormal; Notable for the following:    Glucose, Bld 110 (*)    BUN 45 (*)    Creatinine, Ser 1.54 (*)    GFR calc non Af Amer 37 (*)    GFR calc Af Amer 43 (*)    All other components within normal limits  URINALYSIS, ROUTINE W REFLEX MICROSCOPIC (NOT AT [facility])  POC OCCULT BLOOD, ED  TYPE AND SCREEN    Imaging Review No results found. I have personally reviewed and evaluated these images and lab results as part of my medical decision-making.   EKG Interpretation   Date/Time:  Thursday January 23 2016 11:04:18 EDT Ventricular Rate:  62 PR  Interval:  264 QRS Duration: 144 QT Interval:  437 QTC Calculation: 444 R Axis:   -43 Text Interpretation:  Sinus rhythm Prolonged PR interval Right bundle  branch block Anterolateral infarct, old Confirmed by DELO  MD, DOUGLAS  ([id number]) on 01/23/2016 11:34:21 AM      MDM   Final diagnoses:  Anemia, unspecified anemia type   82 year old male with history of chronic anemia, chronic thrombocytopenia presents to the ED with reported low hemoglobin. Patient had recent blood transfusion 2 weeks ago to 2 hemoglobin of 6. Patient was seen by PCP today and was told his hemoglobin was 7.8 and instructed to come to the ED for possible blood transfusion. On presentation to ED, patient appears well. He is alert and oriented. VITAL signs stable. She denies weakness, shortness of breath. No sinus melena or hematochezia. Hemoccult negative. Hemoglobin in the ED is 8.4. This is actually slightly higher from patient's post transfusion specimen 2 weeks ago. Unsure if patient requires transfusion at this time. PLT are 47, this appears to be chronic when compared to previous lab results. BUN elevated at 45, also appears to be baseline for pt. Patient's family very concerned the patient may require endoscopy as he has a history of ruptured peptic ulcer disease with upper GI bleed. We'll consult GI at this time.  1:57 PM Spoke with gastroenterology, Dr. Paulita Fujita, who states that as there is no clear sign of bleeding, patient has had several negative Hemoccults with chronic thrombocytopenia and anemia. Do not recommend endoscopy at this time. Patient may have possible myelodysplastic syndrome. Recommend consultation hematology for further evaluation. If Hematology feels that they would like GI to consult, they would be happy to do that that time.  Spoke with hematology Dr. Alen Blew who recommends transfusing patient with 1 unit of blood and discharged home with hematology follow-up. He suspects the anemia is not due to bone  loss and more  likely due to a bone marrow deficiency. I discussed this with patient and his family who are agreeable to treatment plan. Patient given 1 unit of blood. Upon reassessment patient stable and ready for discharge.  Patient was discussed with and seen by Dr. Stark Jock who agrees with the treatment plan.        Dondra Spry Deweese, PA-C 01/24/16 1622  Veryl Speak, MD 01/28/16 1620

## 2016-01-23 NOTE — ED Notes (Signed)
Pt is in stable condition upon d/c and is escorted from ED via wheelchair. 

## 2016-01-24 ENCOUNTER — Encounter (HOSPITAL_COMMUNITY): Payer: Medicare Other

## 2016-01-24 ENCOUNTER — Telehealth: Payer: Self-pay | Admitting: *Deleted

## 2016-01-24 ENCOUNTER — Other Ambulatory Visit (HOSPITAL_COMMUNITY): Payer: Self-pay | Admitting: Internal Medicine

## 2016-01-24 LAB — TYPE AND SCREEN
ABO/RH(D): O POS
ANTIBODY SCREEN: NEGATIVE
UNIT DIVISION: 0

## 2016-01-24 NOTE — Telephone Encounter (Signed)
Call received from Kirke Corin ( on contact list ) stating pt was seen in the ER this week and was advised to follow up with hematologist - Dr Alen Blew ASAP.  She is calling to obtain an appointment.  Return call given as [phone].  This RN inquired with Edmonia Lynch if pt has been seen in this office previously - she states " his son was not aware if he has been seen in your office before but apparently he has had some type of anemia for a long time "  Per chart review noted pt was seen by Dr Julien Nordmann 07/17/2014.  Dr Alen Blew was the on call MD on 3/24 at time of pt's ER visit.

## 2016-01-25 LAB — PATHOLOGIST SMEAR REVIEW

## 2016-01-27 ENCOUNTER — Telehealth: Payer: Self-pay | Admitting: Internal Medicine

## 2016-01-27 NOTE — Telephone Encounter (Signed)
Gave pt appt for 1st available

## 2016-01-30 ENCOUNTER — Ambulatory Visit (HOSPITAL_COMMUNITY)
Admission: RE | Admit: 2016-01-30 | Discharge: 2016-01-30 | Disposition: A | Discharge status: Home / Self Care | Payer: Medicare Other | Source: Ambulatory Visit | Attending: Cardiology | Admitting: Cardiology

## 2016-01-30 VITALS — BP 146/62 | HR 62 | Wt 198.8 lb

## 2016-01-30 DIAGNOSIS — E785 Hyperlipidemia, unspecified: Secondary | ICD-10-CM | POA: Diagnosis not present

## 2016-01-30 DIAGNOSIS — I5032 Chronic diastolic (congestive) heart failure: Secondary | ICD-10-CM | POA: Insufficient documentation

## 2016-01-30 DIAGNOSIS — Z79899 Other long term (current) drug therapy: Secondary | ICD-10-CM | POA: Diagnosis not present

## 2016-01-30 DIAGNOSIS — I110 Hypertensive heart disease with heart failure: Secondary | ICD-10-CM | POA: Diagnosis not present

## 2016-01-30 DIAGNOSIS — I2510 Atherosclerotic heart disease of native coronary artery without angina pectoris: Secondary | ICD-10-CM | POA: Diagnosis not present

## 2016-01-30 DIAGNOSIS — F0390 Unspecified dementia without behavioral disturbance: Secondary | ICD-10-CM | POA: Insufficient documentation

## 2016-01-30 DIAGNOSIS — H409 Unspecified glaucoma: Secondary | ICD-10-CM | POA: Diagnosis not present

## 2016-01-30 DIAGNOSIS — J45909 Unspecified asthma, uncomplicated: Secondary | ICD-10-CM | POA: Insufficient documentation

## 2016-01-30 DIAGNOSIS — I10 Essential (primary) hypertension: Secondary | ICD-10-CM

## 2016-01-30 NOTE — Progress Notes (Signed)
Advanced Heart Failure Medication Review by a Pharmacist  Does the patient  feel that his/her medications are working for him/her?  yes  Has the patient been experiencing any side effects to the medications prescribed?  no  Does the patient measure his/her own blood pressure or blood glucose at home?  yes   Does the patient have any problems obtaining medications due to transportation or finances?   no  Understanding of regimen: fair Understanding of indications: fair Potential of compliance: excellent Patient understands to avoid NSAIDs. Patient understands to avoid decongestants.  Issues to address at subsequent visits: None   Pharmacist comments:  Victor Green is a pleasant 82 yo M presenting with his caregivers who manage his medications. His list was updated based on the list that his caregivers brought. He is now using potassium liquid only when he takes metolazone. No other discrepancies noted.   Ruta Hinds. Velva Harman, PharmD, BCPS, CPP Clinical Pharmacist Pager: [phone] Phone: [phone] 01/30/2016 11:14 AM      Time with patient: 4 minutes Preparation and documentation time: 8 minutes Total time: 12 minutes

## 2016-01-30 NOTE — Progress Notes (Signed)
Patient ID: Victor Green, male   DOB: 1920/08/09, 82 y.o.   MRN: [id number]   ADVANCED HF CLINIC NOTE  Patient ID: Victor Green, male   DOB: 09-Jul-1920, 82 y.o.   MRN: [id number] PCP: [facility] Primary Cardiologist: Bensimhon  HPI: Victor Green is a 82 y.o. male with a history of chronic diastolic CHF (Last echo 123456 showed grade 1 diastolic dysfunction and EF 55-60%.), CAD (extensive of coronary calcification by EBCT 2009, Myoview Normal 2009), HTN, HLD,  Recently admitted diuresed 20 pounds. Discharged on 2/6 weight 198.  Switched to torsemide 40 bid.   He returns for HF follow up. Last visit metolazone increased to M/F. Followed by [facility] and has 24 hour care. Ongoing mild dyspnea with exertion. Requires assistance with ADLs . Weight at home 194-200 pounds.  On 2 liters continuously. All meds provided by home health.    ROS: All systems negative except as listed in HPI, PMH and Problem List.  SH:  Social History   Social History  . Marital Status: Married    Spouse Name: N/A  . Number of Children: N/A  . Years of Education: N/A   Occupational History  . Not on file.   Social History Main Topics  . Smoking status: Never Smoker   . Smokeless tobacco: Not on file  . Alcohol Use: No  . Drug Use: No  . Sexual Activity: Not on file   Other Topics Concern  . Not on file   Social History Narrative    FH:  Family History  Problem Relation Age of Onset  . Thrombosis Father     mesenteric artery thrombosis after prostate surgery  . Colon cancer Mother     Past Medical History  Diagnosis Date  . Hypertension   . CAD (coronary artery disease) complete w/u in 2009    extensive coronary calcification by EBCT, normal myoview, echo with EF 70% mild AI  . Hyperlipidemia   . Aortic insufficiency     echo July 2011 EF 60%  . 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 History of GI bleed 2003    due to pyloric-positive ulcer   . Diverticulosis     with remote history of diverticulitis  . Plantar fasciitis     chronic  . Allergic rhinitis     responding well to desensitation injections  . Glaucoma   . Osteoarthritis     both knees  . Asthma   . CHF (congestive heart failure) ([facility])     Current Outpatient Prescriptions  Medication Sig Dispense Refill  . acetaminophen (TYLENOL) 325 MG tablet Take 325 mg by mouth every morning.     Marland Kitchen atorvastatin (LIPITOR) 10 MG tablet Take 1 tablet by mouth  daily 90 tablet 1  . azelastine (ASTELIN) 0.1 % nasal spray Place 1 spray into both nostrils 2 (two) times daily. Use in each nostril as directed 30 mL 3  . brimonidine (ALPHAGAN) 0.15 % ophthalmic solution Place 1 drop into the left eye 2 (two) times daily.     . carvedilol (COREG) 6.25 MG tablet Take 1 tablet (6.25 mg total) by mouth 2 (two) times daily with a meal. 60 tablet 0  . diazepam (VALIUM) 2 MG tablet Take 0.5 tablets (1 mg total) by mouth at bedtime. 90 tablet 1  . dorzolamide (TRUSOPT) 2 % ophthalmic solution Place 1 drop into the left eye 2 (two) times daily.     Marland Kitchen  EPIPEN 2-PAK 0.3 MG/0.3ML SOAJ injection Inject 0.47ml (0.3mg ) into  the muscle once as directed 2 Device 0  . ferrous sulfate 325 (65 FE) MG tablet Take 325 mg by mouth every evening.     . fexofenadine (ALLEGRA) 30 MG tablet Take 45 mg by mouth daily.    . fluticasone (FLONASE) 50 MCG/ACT nasal spray Use 2 sprays in each  nostril twice daily 64 g 1  . glucosamine-chondroitin 500-400 MG tablet Take 1 tablet by mouth 2 (two) times daily.     . hydrALAZINE (APRESOLINE) 25 MG tablet Take 1 tablet (25 mg total) by mouth 3 (three) times daily. 90 tablet 3  . levothyroxine (SYNTHROID, LEVOTHROID) 50 MCG tablet Take 1 tablet by mouth  daily 90 tablet 1  . metolazone (ZAROXOLYN) 2.5 MG tablet Take 1 tablet by mouth every Monday and Friday.  Hold if weight is 200lbs or less. 10 tablet 3  . Multiple Vitamin (MULTIVITAMIN WITH MINERALS) TABS tablet Take 1 tablet by mouth daily.      . pantoprazole (PROTONIX) 40 MG tablet Take 1 tablet by mouth  daily 90 tablet 1  . potassium chloride SA (K-DUR,KLOR-CON) 20 MEQ tablet Take 2 tablets (40 mEq total) by mouth 2 (two) times daily. Take extra 20 meq tablet on Monday and Friday with Metolazone. 130 tablet 6  . Probiotic Product (ALIGN) 4 MG CAPS Take 4 mg by mouth every evening.    . torsemide (DEMADEX) 20 MG tablet Take 2 tablets (40 mg total) by mouth 2 (two) times daily. (Patient taking differently: Take 40 mg by mouth daily. ) 120 tablet 6   No current facility-administered medications for this encounter.    Filed Vitals:   01/30/16 1044  BP: 146/62  Pulse: 62  Weight: 198 lb 12.8 oz (90.175 kg)  SpO2: 90%    PHYSICAL EXAM:  General:  Elderly in wheel chair. 2 care givers present.  HEENT: normal Neck: supple. JVP 5-6. Carotids 2+ bilaterally; no bruits. No lymphadenopathy or thryomegaly appreciated. Cor: PMI normal. Regular rate & rhythm. 2/6 SEM at RUSB Lungs: clear Abdomen: soft, nontender, mildly distended. No hepatosplenomegaly. No bruits or masses. Good bowel sounds. Extremities: no cyanosis, clubbing, rash, edema Neuro: alert & oriented, cranial nerves grossly intact. Moves all 4 extremities w/o difficulty. Affect pleasant.   ASSESSMENT & PLAN: 1. Chronic diastolic HF-NYHA II-III. Volume status stable.Continue torsemide 40 mg twice a day and 2.5 mg metolazone twice a week.  2. HTN-Stable . Continue current regimen.  3. CAD- No chest pain. On atorvastatin 4. Dementia- Has 24 hour care givers.   Follow up in 3 months to reassess volume status.   Darrick Grinder, NP-C  10:46 AM

## 2016-01-30 NOTE — Patient Instructions (Signed)
Follow up in 3 months

## 2016-02-03 ENCOUNTER — Encounter: Payer: Self-pay | Admitting: Internal Medicine

## 2016-02-03 ENCOUNTER — Ambulatory Visit (HOSPITAL_BASED_OUTPATIENT_CLINIC_OR_DEPARTMENT_OTHER): Payer: Medicare Other | Admitting: Internal Medicine

## 2016-02-03 VITALS — BP 110/59 | HR 61 | Temp 97.8°F | Resp 18 | Ht 71.0 in | Wt 192.0 lb

## 2016-02-03 DIAGNOSIS — R0609 Other forms of dyspnea: Secondary | ICD-10-CM

## 2016-02-03 DIAGNOSIS — D696 Thrombocytopenia, unspecified: Secondary | ICD-10-CM

## 2016-02-03 DIAGNOSIS — D649 Anemia, unspecified: Secondary | ICD-10-CM

## 2016-02-03 DIAGNOSIS — R5383 Other fatigue: Secondary | ICD-10-CM | POA: Diagnosis not present

## 2016-02-03 NOTE — Progress Notes (Signed)
[facility] [phone]   [phone]  OFFICE PROGRESS NOTE  Velna Hatchet, MD [place] [address]  DIAGNOSIS: Persistent anemia and thrombocytopenia questionable for myelodysplastic syndrome  PRIOR THERAPY: None  CURRENT THERAPY: Observation and supportive transfusion on as-needed basis  INTERVAL HISTORY: Victor Green 82 y.o. male returns to the clinic today for follow-up visit accompanied by 2 of his caregivers. The patient is feeling fine today with no specific complaints except for mild fatigue. He was seen recently at the emergency department for evaluation and was found to have persistent anemia. He was referred back for reevaluation. The patient was seen several months ago and I recommended for him to consider bone marrow biopsy and aspirate to rule out myelodysplastic syndrome but he declined. He required PRBCs transfusion frequently over the last few months. He denied having any chest pain but continues to have baseline shortness breath increased with exertion with no cough or hemoptysis. He denied having any significant weight loss or night sweats. He has no nausea or vomiting. He has no bleeding issues. Stool for Hemoccult was performed last month and it was negative. Anemia panel was unremarkable.  MEDICAL HISTORY: Past Medical History  Diagnosis Date  . Hypertension   . CAD (coronary artery disease) complete w/u in 2009    extensive coronary calcification by EBCT, normal myoview, echo with EF 70% mild AI  . Hyperlipidemia   . Aortic insufficiency     echo July 2011 EF 60%  . 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 History of GI bleed 2003    due to pyloric-positive ulcer   . Diverticulosis     with remote history of diverticulitis  . Plantar fasciitis     chronic  . Allergic rhinitis     responding well to desensitation injections  . Glaucoma   . Osteoarthritis     both knees  . Asthma     . CHF (congestive heart failure) ([facility])     ALLERGIES:  is allergic to other and tetanus toxoids.  MEDICATIONS:  Current Outpatient Prescriptions  Medication Sig Dispense Refill  . acetaminophen (TYLENOL) 325 MG tablet Take 975 mg by mouth every morning.     Marland Kitchen atorvastatin (LIPITOR) 10 MG tablet Take 1 tablet by mouth  daily 90 tablet 1  . azelastine (ASTELIN) 0.1 % nasal spray Place 1 spray into both nostrils 2 (two) times daily. Use in each nostril as directed 30 mL 3  . brimonidine (ALPHAGAN) 0.15 % ophthalmic solution Place 1 drop into the left eye 2 (two) times daily.     . carvedilol (COREG) 6.25 MG tablet Take 1 tablet (6.25 mg total) by mouth 2 (two) times daily with a meal. 60 tablet 0  . diazepam (VALIUM) 2 MG tablet Take 0.5 tablets (1 mg total) by mouth at bedtime. 90 tablet 1  . dorzolamide (TRUSOPT) 2 % ophthalmic solution Place 1 drop into the left eye 2 (two) times daily.     Marland Kitchen EPIPEN 2-PAK 0.3 MG/0.3ML SOAJ injection Inject 0.35m (0.32m into  the muscle once as directed (Patient not taking: Reported on 01/30/2016) 2 Device 0  . ferrous sulfate 325 (65 FE) MG tablet Take 325 mg by mouth every evening.     . fexofenadine (ALLEGRA) 30 MG tablet Take 45 mg by mouth daily.    . fluticasone (FLONASE) 50 MCG/ACT nasal spray Use 2 sprays in each  nostril twice daily 64 g 1  . glucosamine-chondroitin 500-400 MG tablet Take 1 tablet by mouth 2 (two) times daily.     . hydrALAZINE (APRESOLINE) 25 MG tablet Take 1 tablet (25 mg total) by mouth 3 (three) times daily. 90 tablet 3  . levothyroxine (SYNTHROID, LEVOTHROID) 50 MCG tablet Take 1 tablet by mouth  daily 90 tablet 1  . metolazone (ZAROXOLYN) 2.5 MG tablet Take 1 tablet by mouth every Monday and Friday.  Hold if weight is 200lbs or less. 10 tablet 3  . Multiple Vitamin (MULTIVITAMIN WITH MINERALS) TABS tablet Take 1 tablet by mouth daily.    . pantoprazole (PROTONIX) 40 MG tablet Take 1 tablet by mouth  daily 90 tablet 1  .  potassium chloride 20 MEQ/15ML (10%) SOLN Take 40 mEq by mouth 2 (two) times a week. Take on Monday and Friday with metolazone    . potassium chloride SA (K-DUR,KLOR-CON) 20 MEQ tablet   5  . Probiotic Product (ALIGN) 4 MG CAPS Take 4 mg by mouth every evening.    . torsemide (DEMADEX) 20 MG tablet Take 40 mg by mouth daily.     No current facility-administered medications for this visit.    SURGICAL HISTORY:  Past Surgical History  Procedure Laterality Date  . Tonsillectomy    . Hemorrhoid surgery  1996  . Hernia repair  1996    right  . Lumbar laminectomy  2003  . Cataract extraction      left eye    REVIEW OF SYSTEMS:  A comprehensive review of systems was negative except for: Constitutional: positive for fatigue Respiratory: positive for dyspnea on exertion   PHYSICAL EXAMINATION: General appearance: alert, cooperative, fatigued and no distress Head: Normocephalic, without obvious abnormality, atraumatic Neck: no adenopathy, no JVD, supple, symmetrical, trachea midline and thyroid not enlarged, symmetric, no tenderness/mass/nodules Lymph nodes: Cervical, supraclavicular, and axillary nodes normal. Resp: clear to auscultation bilaterally Back: symmetric, no curvature. ROM normal. No CVA tenderness. Cardio: regular rate and rhythm, S1, S2 normal, no murmur, click, rub or gallop GI: soft, non-tender; bowel sounds normal; no masses,  no organomegaly Extremities: extremities normal, atraumatic, no cyanosis or edema  ECOG PERFORMANCE STATUS: 2 - Symptomatic, <50% confined to bed  Blood pressure 110/59, pulse 61, temperature 97.8 F (36.6 C), temperature source Oral, resp. rate 18, height 5' 11"  (1.803 m), weight 192 lb (87.091 kg), SpO2 97 %.  LABORATORY DATA: Lab Results  Component Value Date   WBC 11.4* 01/23/2016   HGB 8.4* 01/23/2016   HCT 26.9* 01/23/2016   MCV 101.5* 01/23/2016   PLT 47* 01/23/2016      Chemistry      Component Value Date/Time   NA 144  01/23/2016 1120   NA 141 07/17/2014 1356   K 4.1 01/23/2016 1120   K 4.6 07/17/2014 1356   CL 104 01/23/2016 1120   CO2 31 01/23/2016 1120   CO2 23 07/17/2014 1356   BUN 45* 01/23/2016 1120   BUN 49.2* 07/17/2014 1356   CREATININE 1.54* 01/23/2016 1120   CREATININE 1.0 07/17/2014 1356      Component Value Date/Time   CALCIUM 9.3 01/23/2016 1120   CALCIUM 8.7 07/17/2014 1356   ALKPHOS 55 01/23/2016 1120   ALKPHOS 61 07/17/2014 1356   AST 30 01/23/2016 1120   AST 25 07/17/2014 1356   ALT 20 01/23/2016 1120   ALT 21 07/17/2014 1356   BILITOT 0.8 01/23/2016 1120   BILITOT 0.65 07/17/2014 1356  RADIOGRAPHIC STUDIES: Dg Chest Port 1 View  01/09/2016  CLINICAL DATA:  Pre transfusion HTN, CAD, asthma, CHF, hx of aortic insufficiency and trifascicular block, never smoker. No sx EXAM: PORTABLE CHEST 1 VIEW COMPARISON:  12/03/2015 FINDINGS: Film is made with shallow lung inflation. There is elevation of right hemidiaphragm as seen on prior study. The heart size is mildly prominent. There are no focal consolidations. No pulmonary edema. IMPRESSION: 1. Shallow inflation. 2. Cardiomegaly. 3.  No evidence for acute pulmonary abnormality. Electronically Signed   By: Nolon Nations M.D.   On: 01/09/2016 14:12    ASSESSMENT AND PLAN: This is a very pleasant 82 years old white male with persistent anemia and thrombocytopenia. This is most likely secondary to myelodysplastic syndrome in addition to anemia of chronic disease. I had a lengthy discussion with the patient and his caregiver about his current condition and treatment options. The patient again declined to proceed with the bone marrow biopsy and aspirate for confirmation of his diagnosis. I recommended for him to continue with supportive care and transfusion on as-needed basis based on his symptoms. The patient will follow with his primary care physician and I will be happy to see him in the future if needed. The patient voices  understanding of current disease status and treatment options and is in agreement with the current care plan.  All questions were answered. The patient knows to call the clinic with any problems, questions or concerns. We can certainly see the patient much sooner if necessary.  Disclaimer: This note was dictated with voice recognition software. Similar sounding words can inadvertently be transcribed and may not be corrected upon review.

## 2016-02-11 ENCOUNTER — Emergency Department (HOSPITAL_COMMUNITY): Payer: Medicare Other

## 2016-02-11 ENCOUNTER — Emergency Department (HOSPITAL_COMMUNITY)
Admission: EM | Admit: 2016-02-11 | Discharge: 2016-02-11 | Disposition: A | Discharge status: Home / Self Care | Payer: Medicare Other | Attending: Emergency Medicine | Admitting: Emergency Medicine

## 2016-02-11 ENCOUNTER — Encounter (HOSPITAL_COMMUNITY): Payer: Self-pay | Admitting: Emergency Medicine

## 2016-02-11 DIAGNOSIS — D72829 Elevated white blood cell count, unspecified: Secondary | ICD-10-CM | POA: Diagnosis not present

## 2016-02-11 DIAGNOSIS — Z79899 Other long term (current) drug therapy: Secondary | ICD-10-CM | POA: Diagnosis not present

## 2016-02-11 DIAGNOSIS — I2510 Atherosclerotic heart disease of native coronary artery without angina pectoris: Secondary | ICD-10-CM | POA: Insufficient documentation

## 2016-02-11 DIAGNOSIS — H409 Unspecified glaucoma: Secondary | ICD-10-CM | POA: Diagnosis not present

## 2016-02-11 DIAGNOSIS — I509 Heart failure, unspecified: Secondary | ICD-10-CM | POA: Insufficient documentation

## 2016-02-11 DIAGNOSIS — J45909 Unspecified asthma, uncomplicated: Secondary | ICD-10-CM | POA: Diagnosis not present

## 2016-02-11 DIAGNOSIS — M170 Bilateral primary osteoarthritis of knee: Secondary | ICD-10-CM | POA: Diagnosis not present

## 2016-02-11 DIAGNOSIS — R7989 Other specified abnormal findings of blood chemistry: Secondary | ICD-10-CM | POA: Diagnosis present

## 2016-02-11 DIAGNOSIS — Z8719 Personal history of other diseases of the digestive system: Secondary | ICD-10-CM | POA: Diagnosis not present

## 2016-02-11 DIAGNOSIS — I10 Essential (primary) hypertension: Secondary | ICD-10-CM | POA: Insufficient documentation

## 2016-02-11 DIAGNOSIS — Z7951 Long term (current) use of inhaled steroids: Secondary | ICD-10-CM | POA: Diagnosis not present

## 2016-02-11 DIAGNOSIS — E785 Hyperlipidemia, unspecified: Secondary | ICD-10-CM | POA: Insufficient documentation

## 2016-02-11 LAB — URINALYSIS, ROUTINE W REFLEX MICROSCOPIC
BILIRUBIN URINE: NEGATIVE
Glucose, UA: NEGATIVE mg/dL
Hgb urine dipstick: NEGATIVE
KETONES UR: NEGATIVE mg/dL
Leukocytes, UA: NEGATIVE
NITRITE: NEGATIVE
PROTEIN: 30 mg/dL — AB
SPECIFIC GRAVITY, URINE: 1.016 (ref 1.005–1.030)
pH: 7 (ref 5.0–8.0)

## 2016-02-11 LAB — CBC WITH DIFFERENTIAL/PLATELET
BAND NEUTROPHILS: 1 %
BASOS ABS: 0 10*3/uL (ref 0.0–0.1)
BLASTS: 0 %
Basophils Relative: 0 %
EOS ABS: 0 10*3/uL (ref 0.0–0.7)
Eosinophils Relative: 0 %
HCT: 23.4 % — ABNORMAL LOW (ref 39.0–52.0)
HEMOGLOBIN: 7.3 g/dL — AB (ref 13.0–17.0)
Lymphocytes Relative: 31 %
Lymphs Abs: 6 10*3/uL — ABNORMAL HIGH (ref 0.7–4.0)
MCH: 31.2 pg (ref 26.0–34.0)
MCHC: 31.2 g/dL (ref 30.0–36.0)
MCV: 100 fL (ref 78.0–100.0)
METAMYELOCYTES PCT: 1 %
MONOS PCT: 3 %
Monocytes Absolute: 0.6 10*3/uL (ref 0.1–1.0)
Myelocytes: 1 %
NEUTROS ABS: 12.9 10*3/uL — AB (ref 1.7–7.7)
Neutrophils Relative %: 63 %
Other: 0 %
PLATELETS: 38 10*3/uL — AB (ref 150–400)
PROMYELOCYTES ABS: 0 %
RBC: 2.34 MIL/uL — ABNORMAL LOW (ref 4.22–5.81)
RDW: 20 % — AB (ref 11.5–15.5)
WBC: 19.5 10*3/uL — ABNORMAL HIGH (ref 4.0–10.5)
nRBC: 2 /100 WBC — ABNORMAL HIGH

## 2016-02-11 LAB — COMPREHENSIVE METABOLIC PANEL
ALK PHOS: 53 U/L (ref 38–126)
ALT: 17 U/L (ref 17–63)
ANION GAP: 12 (ref 5–15)
AST: 32 U/L (ref 15–41)
Albumin: 3.3 g/dL — ABNORMAL LOW (ref 3.5–5.0)
BILIRUBIN TOTAL: 0.7 mg/dL (ref 0.3–1.2)
BUN: 41 mg/dL — ABNORMAL HIGH (ref 6–20)
CALCIUM: 9.6 mg/dL (ref 8.9–10.3)
CO2: 31 mmol/L (ref 22–32)
Chloride: 100 mmol/L — ABNORMAL LOW (ref 101–111)
Creatinine, Ser: 1.86 mg/dL — ABNORMAL HIGH (ref 0.61–1.24)
GFR, EST AFRICAN AMERICAN: 34 mL/min — AB (ref >60)
GFR, EST NON AFRICAN AMERICAN: 30 mL/min — AB (ref >60)
Glucose, Bld: 128 mg/dL — ABNORMAL HIGH (ref 65–99)
Potassium: 4.2 mmol/L (ref 3.5–5.1)
Sodium: 143 mmol/L (ref 135–145)
TOTAL PROTEIN: 6.6 g/dL (ref 6.5–8.1)

## 2016-02-11 LAB — URINE MICROSCOPIC-ADD ON: RBC / HPF: NONE SEEN RBC/hpf (ref 0–5)

## 2016-02-11 LAB — I-STAT CG4 LACTIC ACID, ED: LACTIC ACID, VENOUS: 1.75 mmol/L (ref 0.5–2.0)

## 2016-02-11 LAB — I-STAT BETA HCG BLOOD, ED (MC, WL, AP ONLY): I-stat hCG, quantitative: <5 m[IU]/mL (ref <5)

## 2016-02-11 NOTE — ED Notes (Signed)
I&O cath not needed, pt was able to provide a urine sample

## 2016-02-11 NOTE — ED Provider Notes (Signed)
CSN: [id number]     Arrival date & time 02/11/16  1350 History   First MD Initiated Contact with Patient 02/11/16 1759     Chief Complaint  Patient presents with  . Abnormal Lab     (Consider location/radiation/quality/duration/timing/severity/associated sxs/prior Treatment) Patient is a 82 y.o. male presenting with weakness. The history is provided by a caregiver (Patient had blood work done at his doctor's office. His white blood count came back elevated and the doctor said to come to the hospital).  Weakness This is a new problem. The current episode started 12 to 24 hours ago. The problem occurs constantly. The problem has not changed since onset.Pertinent negatives include no chest pain, no abdominal pain and no headaches. Nothing aggravates the symptoms. Nothing relieves the symptoms.    Past Medical History  Diagnosis Date  . Hypertension   . CAD (coronary artery disease) complete w/u in 2009    extensive coronary calcification by EBCT, normal myoview, echo with EF 70% mild AI  . Hyperlipidemia   . Aortic insufficiency     echo July 2011 EF 60%  . 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 History of GI bleed 2003    due to pyloric-positive ulcer   . Diverticulosis     with remote history of diverticulitis  . Plantar fasciitis     chronic  . Allergic rhinitis     responding well to desensitation injections  . Glaucoma   . Osteoarthritis     both knees  . Asthma   . CHF (congestive heart failure) [facility])    Past Surgical History  Procedure Laterality Date  . Tonsillectomy    . Hemorrhoid surgery  1996  . Hernia repair  1996    right  . Lumbar laminectomy  2003  . Cataract extraction      left eye   Family History  Problem Relation Age of Onset  . Thrombosis Father     mesenteric artery thrombosis after prostate surgery  . Colon cancer Mother    Social History  Substance Use Topics  . Smoking status: Never Smoker   . Smokeless tobacco: None  .  Alcohol Use: No    Review of Systems  Constitutional: Negative for appetite change and fatigue.  HENT: Negative for congestion, ear discharge and sinus pressure.   Eyes: Negative for discharge.  Respiratory: Negative for cough.   Cardiovascular: Negative for chest pain.  Gastrointestinal: Negative for abdominal pain and diarrhea.  Genitourinary: Negative for frequency and hematuria.  Musculoskeletal: Negative for back pain.  Skin: Negative for rash.  Neurological: Positive for weakness. Negative for seizures and headaches.  Psychiatric/Behavioral: Negative for hallucinations.      Allergies  Other and Tetanus toxoids  Home Medications   Prior to Admission medications   Medication Sig Start Date End Date Taking? Authorizing Provider  acetaminophen (TYLENOL) 325 MG tablet Take 975 mg by mouth every morning.    Yes Historical Provider, MD  atorvastatin (LIPITOR) 10 MG tablet Take 1 tablet by mouth  daily 07/09/15  Yes [name], MD  azelastine (ASTELIN) 0.1 % nasal spray Place 1 spray into both nostrils 2 (two) times daily. Use in each nostril as directed 08/20/14  Yes [name], MD  brimonidine (ALPHAGAN) 0.15 % ophthalmic solution Place 1 drop into the left eye 2 (two) times daily.    Yes Historical Provider, MD  carvedilol (COREG) 6.25 MG tablet Take 1 tablet (6.25 mg total) by  mouth 2 (two) times daily with a meal. 09/22/15  Yes Debbe Odea, MD  diazepam (VALIUM) 2 MG tablet Take 0.5 tablets (1 mg total) by mouth at bedtime. 10/11/15  Yes [name], MD  dorzolamide (TRUSOPT) 2 % ophthalmic solution Place 1 drop into the left eye 2 (two) times daily.    Yes Historical Provider, MD  EPIPEN 2-PAK 0.3 MG/0.3ML SOAJ injection Inject 0.30ml (0.3mg ) into  the muscle once as directed 12/26/14  Yes [name], MD  ferrous sulfate 325 (65 FE) MG tablet Take 325 mg by mouth every evening.    Yes Historical Provider, MD  fexofenadine (ALLEGRA) 30 MG tablet Take 45 mg by mouth daily.   Yes  Historical Provider, MD  fluticasone (FLONASE) 50 MCG/ACT nasal spray Use 2 sprays in each  nostril twice daily 08/21/15  Yes [name], MD  glucosamine-chondroitin 500-400 MG tablet Take 1 tablet by mouth 2 (two) times daily.    Yes Historical Provider, MD  hydrALAZINE (APRESOLINE) 25 MG tablet Take 1 tablet (25 mg total) by mouth 3 (three) times daily. 12/09/15  Yes Ripudeep Krystal Eaton, MD  levothyroxine (SYNTHROID, LEVOTHROID) 50 MCG tablet Take 1 tablet by mouth  daily 07/01/15  Yes [name], MD  metolazone (ZAROXOLYN) 2.5 MG tablet Take 1 tablet by mouth every Monday and Friday.  Hold if weight is 200lbs or less. 12/26/15  Yes Jolaine Artist, MD  Multiple Vitamin (MULTIVITAMIN WITH MINERALS) TABS tablet Take 1 tablet by mouth daily.   Yes Historical Provider, MD  pantoprazole (PROTONIX) 40 MG tablet Take 1 tablet by mouth  daily 09/25/15  Yes [name], MD  potassium chloride 20 MEQ/15ML (10%) SOLN Take 40 mEq by mouth 2 (two) times a week. Take on Monday and Friday with metolazone   Yes Historical Provider, MD  Probiotic Product (ALIGN) 4 MG CAPS Take 4 mg by mouth every evening.   Yes Historical Provider, MD  torsemide (DEMADEX) 20 MG tablet Take 40 mg by mouth daily.   Yes Historical Provider, MD   BP 122/50 mmHg  Pulse 62  Temp(Src) 98.5 F (36.9 C) (Oral)  Resp 11  Ht 5\' 10"  (1.778 m)  Wt 200 lb (90.719 kg)  BMI 28.70 kg/m2  SpO2 98% Physical Exam  Constitutional: He is oriented to person, place, and time. He appears well-developed.  HENT:  Head: Normocephalic.  Eyes: Conjunctivae and EOM are normal. No scleral icterus.  Neck: Neck supple. No thyromegaly present.  Cardiovascular: Normal rate and regular rhythm.  Exam reveals no gallop and no friction rub.   No murmur heard. Pulmonary/Chest: No stridor. He has no wheezes. He has no rales. He exhibits no tenderness.  Abdominal: He exhibits no distension. There is no tenderness. There is no rebound.  Musculoskeletal: Normal range  of motion. He exhibits no edema.  Lymphadenopathy:    He has no cervical adenopathy.  Neurological: He is oriented to person, place, and time. He exhibits normal muscle tone. Coordination normal.  Skin: No rash noted. No erythema.  Psychiatric: He has a normal mood and affect. His behavior is normal.    ED Course  Procedures (including critical care time) Labs Review Labs Reviewed  COMPREHENSIVE METABOLIC PANEL - Abnormal; Notable for the following:    Chloride 100 (*)    Glucose, Bld 128 (*)    BUN 41 (*)    Creatinine, Ser 1.86 (*)    Albumin 3.3 (*)    GFR calc non Af Amer 30 (*)  GFR calc Af Amer 34 (*)    All other components within normal limits  CBC WITH DIFFERENTIAL/PLATELET - Abnormal; Notable for the following:    WBC 19.5 (*)    RBC 2.34 (*)    Hemoglobin 7.3 (*)    HCT 23.4 (*)    RDW 20.0 (*)    Platelets 38 (*)    nRBC 2 (*)    Neutro Abs 12.9 (*)    Lymphs Abs 6.0 (*)    All other components within normal limits  URINALYSIS, ROUTINE W REFLEX MICROSCOPIC (NOT AT [facility]) - Abnormal; Notable for the following:    Protein, ur 30 (*)    All other components within normal limits  URINE MICROSCOPIC-ADD ON - Abnormal; Notable for the following:    Squamous Epithelial / LPF 0-5 (*)    Bacteria, UA FEW (*)    Casts HYALINE CASTS (*)    All other components within normal limits  URINE CULTURE  I-STAT BETA HCG BLOOD, ED ([facility], [facility], [facility] ONLY)  I-STAT CG4 LACTIC ACID, ED    Imaging Review Dg Chest 2 View  02/11/2016  CLINICAL DATA:  Elevated white blood cell count. Hypertension. Coronary artery disease EXAM: CHEST  2 VIEW COMPARISON:  Report of 12/03/2015 ; images not available. FINDINGS: Patient rotated right on the frontal. Midline trachea. Cardiomegaly with a tortuous thoracic aorta. Transverse aortic atherosclerosis. Moderate right hemidiaphragm eventration. No pleural effusion or pneumothorax. Bibasilar volume loss. No congestive failure. No lobar consolidation.  IMPRESSION: Cardiomegaly and right hemidiaphragm eventration. No acute findings. Electronically Signed   By: Abigail Miyamoto M.D.   On: 02/11/2016 14:51   I have personally reviewed and evaluated these images and lab results as part of my medical decision-making.   EKG Interpretation None      MDM   Final diagnoses:  Leukocytosis    Chemistries urinalysis chest x-ray all negative. Vital signs normal. Patient complains mild weakness but he is always mildly weak. White blood count elevated at 19.5 and he is anemic hemoglobin 7.3 but it has been running around the. I spoke with the oncologist on-call for Dr. Earlie Server and it was decided the patient can go home and call if there is any problems and Dr. Earlie Server would probably check on the patient next day or 2    Milton Ferguson, MD 02/11/16 2242

## 2016-02-11 NOTE — ED Notes (Signed)
Pt is being sent over form his doctors office for abn labs/elevated wbc

## 2016-02-11 NOTE — Discharge Instructions (Signed)
Call your doctor for any fever or if you feel bad in any way

## 2016-02-13 LAB — URINE CULTURE

## 2016-02-13 LAB — PATHOLOGIST SMEAR REVIEW

## 2016-02-18 ENCOUNTER — Observation Stay (HOSPITAL_COMMUNITY)
Admission: EM | Admit: 2016-02-18 | Discharge: 2016-02-20 | Disposition: A | Discharge status: Home / Self Care | Payer: Medicare Other | Attending: Internal Medicine | Admitting: Internal Medicine

## 2016-02-18 ENCOUNTER — Emergency Department (HOSPITAL_COMMUNITY): Payer: Medicare Other

## 2016-02-18 ENCOUNTER — Encounter (HOSPITAL_COMMUNITY): Payer: Self-pay | Admitting: Emergency Medicine

## 2016-02-18 DIAGNOSIS — E7800 Pure hypercholesterolemia, unspecified: Secondary | ICD-10-CM | POA: Diagnosis not present

## 2016-02-18 DIAGNOSIS — Z7951 Long term (current) use of inhaled steroids: Secondary | ICD-10-CM | POA: Diagnosis not present

## 2016-02-18 DIAGNOSIS — D649 Anemia, unspecified: Secondary | ICD-10-CM | POA: Diagnosis not present

## 2016-02-18 DIAGNOSIS — E039 Hypothyroidism, unspecified: Secondary | ICD-10-CM | POA: Diagnosis not present

## 2016-02-18 DIAGNOSIS — Z79899 Other long term (current) drug therapy: Secondary | ICD-10-CM | POA: Diagnosis not present

## 2016-02-18 DIAGNOSIS — Z993 Dependence on wheelchair: Secondary | ICD-10-CM | POA: Insufficient documentation

## 2016-02-18 DIAGNOSIS — J45909 Unspecified asthma, uncomplicated: Secondary | ICD-10-CM | POA: Insufficient documentation

## 2016-02-18 DIAGNOSIS — M170 Bilateral primary osteoarthritis of knee: Secondary | ICD-10-CM | POA: Diagnosis not present

## 2016-02-18 DIAGNOSIS — E785 Hyperlipidemia, unspecified: Secondary | ICD-10-CM | POA: Diagnosis not present

## 2016-02-18 DIAGNOSIS — Z7401 Bed confinement status: Secondary | ICD-10-CM | POA: Diagnosis not present

## 2016-02-18 DIAGNOSIS — D696 Thrombocytopenia, unspecified: Secondary | ICD-10-CM | POA: Diagnosis not present

## 2016-02-18 DIAGNOSIS — N183 Chronic kidney disease, stage 3 unspecified: Secondary | ICD-10-CM | POA: Diagnosis present

## 2016-02-18 DIAGNOSIS — L8990 Pressure ulcer of unspecified site, unspecified stage: Secondary | ICD-10-CM | POA: Insufficient documentation

## 2016-02-18 DIAGNOSIS — I5032 Chronic diastolic (congestive) heart failure: Secondary | ICD-10-CM | POA: Diagnosis not present

## 2016-02-18 DIAGNOSIS — H409 Unspecified glaucoma: Secondary | ICD-10-CM | POA: Diagnosis not present

## 2016-02-18 DIAGNOSIS — R531 Weakness: Secondary | ICD-10-CM | POA: Diagnosis present

## 2016-02-18 DIAGNOSIS — I130 Hypertensive heart and chronic kidney disease with heart failure and stage 1 through stage 4 chronic kidney disease, or unspecified chronic kidney disease: Secondary | ICD-10-CM | POA: Diagnosis not present

## 2016-02-18 DIAGNOSIS — I10 Essential (primary) hypertension: Secondary | ICD-10-CM | POA: Diagnosis not present

## 2016-02-18 DIAGNOSIS — I2510 Atherosclerotic heart disease of native coronary artery without angina pectoris: Secondary | ICD-10-CM | POA: Insufficient documentation

## 2016-02-18 HISTORY — DX: Personal history of other medical treatment: Z92.89

## 2016-02-18 LAB — CBC WITH DIFFERENTIAL/PLATELET
BASOS PCT: 0 %
Blasts: 10 %
EOS PCT: 1 %
HCT: 19.8 % — ABNORMAL LOW (ref 39.0–52.0)
Hemoglobin: 6.2 g/dL — CL (ref 13.0–17.0)
Lymphocytes Relative: 24 %
MCH: 31.8 pg (ref 26.0–34.0)
MCHC: 31.3 g/dL (ref 30.0–36.0)
MCV: 101.5 fL — AB (ref 78.0–100.0)
Monocytes Relative: 17 %
NEUTROS PCT: 48 %
PLATELETS: 34 10*3/uL — AB (ref 150–400)
RBC: 1.95 MIL/uL — AB (ref 4.22–5.81)
RDW: 20.6 % — AB (ref 11.5–15.5)
WBC: 28.9 10*3/uL — ABNORMAL HIGH (ref 4.0–10.5)

## 2016-02-18 LAB — COMPREHENSIVE METABOLIC PANEL
ALBUMIN: 3.2 g/dL — AB (ref 3.5–5.0)
ALK PHOS: 54 U/L (ref 38–126)
ALT: 18 U/L (ref 17–63)
AST: 33 U/L (ref 15–41)
Anion gap: 10 (ref 5–15)
BUN: 39 mg/dL — AB (ref 6–20)
CALCIUM: 9.1 mg/dL (ref 8.9–10.3)
CO2: 28 mmol/L (ref 22–32)
CREATININE: 1.83 mg/dL — AB (ref 0.61–1.24)
Chloride: 104 mmol/L (ref 101–111)
GFR calc Af Amer: 35 mL/min — ABNORMAL LOW (ref >60)
GFR calc non Af Amer: 30 mL/min — ABNORMAL LOW (ref >60)
GLUCOSE: 101 mg/dL — AB (ref 65–99)
Potassium: 3.9 mmol/L (ref 3.5–5.1)
SODIUM: 142 mmol/L (ref 135–145)
Total Bilirubin: 0.8 mg/dL (ref 0.3–1.2)
Total Protein: 6.4 g/dL — ABNORMAL LOW (ref 6.5–8.1)

## 2016-02-18 LAB — LIPASE, BLOOD: LIPASE: 59 U/L — AB (ref 11–51)

## 2016-02-18 LAB — I-STAT CG4 LACTIC ACID, ED: LACTIC ACID, VENOUS: 1.05 mmol/L (ref 0.5–2.0)

## 2016-02-18 LAB — URINALYSIS, ROUTINE W REFLEX MICROSCOPIC
BILIRUBIN URINE: NEGATIVE
GLUCOSE, UA: NEGATIVE mg/dL
HGB URINE DIPSTICK: NEGATIVE
KETONES UR: NEGATIVE mg/dL
Leukocytes, UA: NEGATIVE
Nitrite: NEGATIVE
PH: 7.5 (ref 5.0–8.0)
PROTEIN: 30 mg/dL — AB
Specific Gravity, Urine: 1.016 (ref 1.005–1.030)

## 2016-02-18 LAB — PROTIME-INR
INR: 1.13 (ref 0.00–1.49)
Prothrombin Time: 14.7 seconds (ref 11.6–15.2)

## 2016-02-18 LAB — TROPONIN I: Troponin I: <0.03 ng/mL (ref <0.031)

## 2016-02-18 LAB — URINE MICROSCOPIC-ADD ON: RBC / HPF: NONE SEEN RBC/hpf (ref 0–5)

## 2016-02-18 LAB — APTT: APTT: 31 s (ref 24–37)

## 2016-02-18 LAB — PREPARE RBC (CROSSMATCH)

## 2016-02-18 LAB — BRAIN NATRIURETIC PEPTIDE: B NATRIURETIC PEPTIDE 5: 354.7 pg/mL — AB (ref 0.0–100.0)

## 2016-02-18 MED ORDER — AZELASTINE HCL 0.1 % NA SOLN
1.0000 | Freq: Two times a day (BID) | NASAL | Status: DC
  Administered 2016-02-19 – 2016-02-20 (×4): 1 via NASAL
  Filled 2016-02-18: qty 30

## 2016-02-18 MED ORDER — EPINEPHRINE 0.3 MG/0.3ML IJ SOAJ
0.3000 mg | INTRAMUSCULAR | Status: DC | PRN
  Filled 2016-02-18: qty 0.3

## 2016-02-18 MED ORDER — ONDANSETRON HCL 4 MG/2ML IJ SOLN: 4.0000 mg | Freq: Four times a day (QID) | INTRAMUSCULAR | Status: DC | PRN

## 2016-02-18 MED ORDER — ATORVASTATIN CALCIUM 10 MG PO TABS
10.0000 mg | ORAL_TABLET | Freq: Every day | ORAL | Status: DC
  Administered 2016-02-19: 10 mg via ORAL
  Filled 2016-02-18 (×2): qty 1

## 2016-02-18 MED ORDER — ACETAMINOPHEN 650 MG RE SUPP: 650.0000 mg | Freq: Four times a day (QID) | RECTAL | Status: DC | PRN

## 2016-02-18 MED ORDER — TORSEMIDE 20 MG PO TABS
40.0000 mg | ORAL_TABLET | Freq: Every day | ORAL | Status: DC
  Administered 2016-02-19 – 2016-02-20 (×3): 40 mg via ORAL
  Filled 2016-02-18 (×3): qty 2

## 2016-02-18 MED ORDER — EPINEPHRINE HCL 1 MG/ML IJ SOLN: 0.3000 mg | INTRAMUSCULAR | Status: DC | PRN

## 2016-02-18 MED ORDER — FLUTICASONE PROPIONATE 50 MCG/ACT NA SUSP
1.0000 | Freq: Every day | NASAL | Status: DC
  Administered 2016-02-19 – 2016-02-20 (×2): 1 via NASAL
  Filled 2016-02-18: qty 16

## 2016-02-18 MED ORDER — ACETAMINOPHEN 325 MG PO TABS
975.0000 mg | ORAL_TABLET | Freq: Every morning | ORAL | Status: DC
  Administered 2016-02-19 – 2016-02-20 (×2): 975 mg via ORAL
  Filled 2016-02-18 (×2): qty 3

## 2016-02-18 MED ORDER — ADULT MULTIVITAMIN W/MINERALS CH
1.0000 | ORAL_TABLET | Freq: Every day | ORAL | Status: DC
  Administered 2016-02-19 – 2016-02-20 (×3): 1 via ORAL
  Filled 2016-02-18 (×3): qty 1

## 2016-02-18 MED ORDER — ONDANSETRON HCL 4 MG PO TABS: 4.0000 mg | ORAL_TABLET | Freq: Four times a day (QID) | ORAL | Status: DC | PRN

## 2016-02-18 MED ORDER — PANTOPRAZOLE SODIUM 40 MG PO TBEC
40.0000 mg | DELAYED_RELEASE_TABLET | Freq: Every day | ORAL | Status: DC
  Administered 2016-02-19 – 2016-02-20 (×3): 40 mg via ORAL
  Filled 2016-02-18 (×3): qty 1

## 2016-02-18 MED ORDER — FERROUS SULFATE 325 (65 FE) MG PO TABS
325.0000 mg | ORAL_TABLET | Freq: Every evening | ORAL | Status: DC
  Administered 2016-02-19 (×2): 325 mg via ORAL
  Filled 2016-02-18 (×2): qty 1

## 2016-02-18 MED ORDER — LEVOTHYROXINE SODIUM 50 MCG PO TABS
50.0000 ug | ORAL_TABLET | Freq: Every day | ORAL | Status: DC
  Administered 2016-02-19 – 2016-02-20 (×2): 50 ug via ORAL
  Filled 2016-02-18 (×2): qty 1

## 2016-02-18 MED ORDER — METOLAZONE 2.5 MG PO TABS
2.5000 mg | ORAL_TABLET | Freq: Once | ORAL | Status: AC
  Administered 2016-02-19: 2.5 mg via ORAL
  Filled 2016-02-18: qty 1

## 2016-02-18 MED ORDER — SACCHAROMYCES BOULARDII 250 MG PO CAPS
250.0000 mg | ORAL_CAPSULE | Freq: Every evening | ORAL | Status: DC
  Administered 2016-02-19 (×2): 250 mg via ORAL
  Filled 2016-02-18 (×2): qty 1

## 2016-02-18 MED ORDER — ACETAMINOPHEN 325 MG PO TABS: 650.0000 mg | ORAL_TABLET | Freq: Four times a day (QID) | ORAL | Status: DC | PRN

## 2016-02-18 MED ORDER — DIAZEPAM 2 MG PO TABS
1.0000 mg | ORAL_TABLET | Freq: Every day | ORAL | Status: DC
  Administered 2016-02-19 (×2): 1 mg via ORAL
  Filled 2016-02-18 (×2): qty 1

## 2016-02-18 MED ORDER — DORZOLAMIDE HCL 2 % OP SOLN
1.0000 [drp] | Freq: Two times a day (BID) | OPHTHALMIC | Status: DC
  Administered 2016-02-19 – 2016-02-20 (×4): 1 [drp] via OPHTHALMIC
  Filled 2016-02-18: qty 10

## 2016-02-18 MED ORDER — CARVEDILOL 6.25 MG PO TABS
6.2500 mg | ORAL_TABLET | Freq: Two times a day (BID) | ORAL | Status: DC
  Administered 2016-02-19 – 2016-02-20 (×3): 6.25 mg via ORAL
  Filled 2016-02-18 (×3): qty 1

## 2016-02-18 MED ORDER — LORATADINE 10 MG PO TABS
10.0000 mg | ORAL_TABLET | Freq: Every day | ORAL | Status: DC
Start: 2016-02-18 — End: 2016-02-20
  Administered 2016-02-19 – 2016-02-20 (×3): 10 mg via ORAL
  Filled 2016-02-18 (×3): qty 1

## 2016-02-18 MED ORDER — GLUCOSAMINE-CHONDROITIN 500-400 MG PO TABS: 1.0000 | ORAL_TABLET | Freq: Two times a day (BID) | ORAL | Status: DC

## 2016-02-18 MED ORDER — HYDRALAZINE HCL 25 MG PO TABS
25.0000 mg | ORAL_TABLET | Freq: Three times a day (TID) | ORAL | Status: DC
  Administered 2016-02-19 – 2016-02-20 (×5): 25 mg via ORAL
  Filled 2016-02-18 (×5): qty 1

## 2016-02-18 MED ORDER — SODIUM CHLORIDE 0.9 % IV SOLN: 10.0000 mL/h | Freq: Once | INTRAVENOUS | Status: DC

## 2016-02-18 MED ORDER — BRIMONIDINE TARTRATE 0.15 % OP SOLN
1.0000 [drp] | Freq: Two times a day (BID) | OPHTHALMIC | Status: DC
  Administered 2016-02-19 – 2016-02-20 (×4): 1 [drp] via OPHTHALMIC
  Filled 2016-02-18: qty 5

## 2016-02-18 MED ORDER — SODIUM CHLORIDE 0.9% FLUSH
3.0000 mL | Freq: Two times a day (BID) | INTRAVENOUS | Status: DC
  Administered 2016-02-19: 3 mL via INTRAVENOUS

## 2016-02-18 NOTE — Progress Notes (Signed)
PHARMACIST - PHYSICIAN ORDER COMMUNICATION  CONCERNING: P&T Medication Policy on Herbal Medications  DESCRIPTION:  This patient's order for:  Chondroitin-glucosamine  has been noted.  This product(s) is classified as an "herbal" or natural product. Due to a lack of definitive safety studies or FDA approval, nonstandard manufacturing practices, plus the potential risk of unknown drug-drug interactions while on inpatient medications, the Pharmacy and Therapeutics Committee does not permit the use of "herbal" or natural products of this type within [facility].   ACTION TAKEN: The pharmacy department is unable to verify this order at this time and your patient has been informed of this safety policy. Please reevaluate patient's clinical condition at discharge and address if the herbal or natural product(s) should be resumed at that time.  Sherlon Handing, PharmD, BCPS Clinical pharmacist, pager [phone] 02/18/2016 10:38 PM

## 2016-02-18 NOTE — ED Notes (Signed)
Blood bank called, pt's antibody is positive so blood transfusion will be delayed due to need to identify antibody for compatibility.

## 2016-02-18 NOTE — ED Notes (Signed)
[facility] from home, was anemic last week and was not transfused, family reports increased weakness, pt denies - has no complaints, VSS, NAD

## 2016-02-18 NOTE — H&P (Addendum)
History and Physical    Victor Green YWV:[id number] DOB: [birth year] DOA: 02/18/2016  Referring MD/NP/PA:   PCP: Velna Hatchet, MD   Outpatient Specialists: Oncologist, Dr. Julien Nordmann  Patient coming from:  Home   Chief Complaint: generalized weakness  HPI: Victor Green is a 82 y.o. male with medical history significant of persistent anemia and thrombocytopenia questionable for myelodysplastic syndrome per his oncologist, Dr. Julien Nordmann, HTN, hyperlipidemia, asthma, GERD, hypothyroidism, CAD, aortic insufficiency, GI bleeding secondary to pyloric-positive ulcer, dCHF, glaucoma, diverticulosis, chronic kidney disease-stage III, who presents with generalized weakness.  Patient reports that he has been having generalized weakness in the past several days, which has been progressively getting worse. Patient was evaluated one week ago for similar concerns. He denies melena, hemoptysis and hematuria. No chest pain, dizziness, shortness of breath, nausea, vomiting, abdominal pain or diarrhea. No unilateral weakness. No symptoms of UTI.  ED Course: pt was found to have hemoglobin drop from 8.4 on 01/23/16--> 7.3 on 02/11/16-->6.2 today. Platelets 38 on 02/11/16-->34 today. WBC 28.9, temperature normal, no tachycardia, stable renal function, lactate 1.05, troponin negative, lipase 59, negative urinalysis. Chest x-ray showed elevated right hemidiaphragm with chronic right basilar atelectasis similar to prior exams; Low lung volumes are present, causing crowding of the pulmonary vasculature; borderline enlargement of the cardiopericardial silhouette; and atherosclerotic aortic arch. Pt is Admitted to inpatient for further evaluation and treatment and observation.  Can patient participate in ADLs?  barely  Review of Systems:   General: no fevers, chills, no changes in body weight, has poor appetite, has fatigue HEENT: no blurry vision, hearing changes or sore throat Pulm: no dyspnea, coughing,  wheezing CV: no chest pain, no palpitations Abd: no nausea, vomiting, abdominal pain, diarrhea, constipation GU: no dysuria, burning on urination, increased urinary frequency, hematuria  Ext: no leg edema. Has edema over both elbows and arms. Neuro: no unilateral weakness, numbness, or tingling, no vision change or hearing loss Skin: no rash MSK: No muscle spasm, no deformity, no limitation of range of movement in spin Heme: No easy bruising.  Travel history: No recent long distant travel.  Allergy:  Allergies  Allergen Reactions  . Other Anaphylaxis    Horse serum  . Tetanus Toxoids Other (See Comments)    Past Medical History  Diagnosis Date  . Hypertension   . CAD (coronary artery disease) complete w/u in 2009    extensive coronary calcification by EBCT, normal myoview, echo with EF 70% mild AI  . Hyperlipidemia   . Aortic insufficiency     echo July 2011 EF 60%  . 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 History of GI bleed 2003    due to pyloric-positive ulcer   . Diverticulosis     with remote history of diverticulitis  . Plantar fasciitis     chronic  . Allergic rhinitis     responding well to desensitation injections  . Glaucoma   . Osteoarthritis     both knees  . Asthma   . CHF (congestive heart failure) [facility])     Past Surgical History  Procedure Laterality Date  . Tonsillectomy    . Hemorrhoid surgery  1996  . Hernia repair  1996    right  . Lumbar laminectomy  2003  . Cataract extraction      left eye    Social History:  reports that he has never smoked. He does not have any smokeless tobacco history on file. He reports  that he does not drink alcohol or use illicit drugs.  Family History:  Family History  Problem Relation Age of Onset  . Thrombosis Father     mesenteric artery thrombosis after prostate surgery  . Colon cancer Mother      Prior to Admission medications   Medication Sig Start Date End Date Taking? Authorizing  Provider  acetaminophen (TYLENOL) 325 MG tablet Take 975 mg by mouth every morning.     Historical Provider, MD  atorvastatin (LIPITOR) 10 MG tablet Take 1 tablet by mouth  daily 07/09/15   [name], MD  azelastine (ASTELIN) 0.1 % nasal spray Place 1 spray into both nostrils 2 (two) times daily. Use in each nostril as directed 08/20/14   [name], MD  brimonidine (ALPHAGAN) 0.15 % ophthalmic solution Place 1 drop into the left eye 2 (two) times daily.     Historical Provider, MD  carvedilol (COREG) 6.25 MG tablet Take 1 tablet (6.25 mg total) by mouth 2 (two) times daily with a meal. 09/22/15   Debbe Odea, MD  diazepam (VALIUM) 2 MG tablet Take 0.5 tablets (1 mg total) by mouth at bedtime. 10/11/15   [name], MD  dorzolamide (TRUSOPT) 2 % ophthalmic solution Place 1 drop into the left eye 2 (two) times daily.     Historical Provider, MD  EPIPEN 2-PAK 0.3 MG/0.3ML SOAJ injection Inject 0.56m (0.342m into  the muscle once as directed 12/26/14   AjElayne SnareMD  ferrous sulfate 325 (65 FE) MG tablet Take 325 mg by mouth every evening.     Historical Provider, MD  fexofenadine (ALLEGRA) 30 MG tablet Take 45 mg by mouth daily.    Historical Provider, MD  fluticasone (FAsencion Islam50 MCG/ACT nasal spray Use 2 sprays in each  nostril twice daily 08/21/15   AjElayne SnareMD  glucosamine-chondroitin 500-400 MG tablet Take 1 tablet by mouth 2 (two) times daily.     Historical Provider, MD  hydrALAZINE (APRESOLINE) 25 MG tablet Take 1 tablet (25 mg total) by mouth 3 (three) times daily. 12/09/15   Ripudeep K Krystal EatonMD  levothyroxine (SYNTHROID, LEVOTHROID) 50 MCG tablet Take 1 tablet by mouth  daily 07/01/15   AjElayne SnareMD  metolazone (ZAROXOLYN) 2.5 MG tablet Take 1 tablet by mouth every Monday and Friday.  Hold if weight is 200lbs or less. 12/26/15   DaJolaine ArtistMD  Multiple Vitamin (MULTIVITAMIN WITH MINERALS) TABS tablet Take 1 tablet by mouth daily.    Historical Provider, MD  pantoprazole (PROTONIX) 40  MG tablet Take 1 tablet by mouth  daily 09/25/15   AjElayne SnareMD  potassium chloride 20 MEQ/15ML (10%) SOLN Take 40 mEq by mouth 2 (two) times a week. Take on Monday and Friday with metolazone    Historical Provider, MD  Probiotic Product (ALIGN) 4 MG CAPS Take 4 mg by mouth every evening.    Historical Provider, MD  torsemide (DEMADEX) 20 MG tablet Take 40 mg by mouth daily.    Historical Provider, MD    Physical Exam: Filed Vitals:   02/18/16 1745  BP: 144/69  Pulse: 66  Temp: 98.2 F (36.8 C)  TempSrc: Oral  Resp: 16  Height: 5' 10"  (1.778 m)  Weight: 90.719 kg (200 lb)  SpO2: 98%   General: Not in acute distress HEENT:       Eyes: PERRL, EOMI, no scleral icterus.       ENT: No discharge from the ears and nose, no pharynx injection, no  tonsillar enlargement.        Neck: No JVD, no bruit, no mass felt. Heme: No neck lymph node enlargement. Cardiac: S1/S2, RRR, No murmurs, No gallops or rubs. Pulm: decreased air movement bilaterally. No rales, wheezing, rhonchi or rubs. Abd: Soft, nondistended, nontender, no rebound pain, no organomegaly, BS present. GU: No hematuria Ext: No pitting leg edema bilaterally. 2+DP/PT pulse bilaterally. Has edema over both elbows and arms. Musculoskeletal: No joint deformities, No joint redness or warmth, no limitation of ROM in spin. Skin: No rashes. Decubitus ulcers Neuro: Alert, oriented X3, cranial nerves II-XII grossly intact, moves all extremities normally. Psych: Patient is not psychotic, no suicidal or hemocidal ideation.  Labs on Admission: I have personally reviewed following labs and imaging studies  CBC:  Recent Labs Lab 02/18/16 1821  WBC 28.9*  HGB 6.2*  HCT 19.8*  MCV 101.5*  PLT 34*   Basic Metabolic Panel:  Recent Labs Lab 02/18/16 1821  NA 142  K 3.9  CL 104  CO2 28  GLUCOSE 101*  BUN 39*  CREATININE 1.83*  CALCIUM 9.1   GFR: Estimated Creatinine Clearance: 28.6 mL/min (by C-G formula based on Cr of  1.83). Liver Function Tests:  Recent Labs Lab 02/18/16 1821  AST 33  ALT 18  ALKPHOS 54  BILITOT 0.8  PROT 6.4*  ALBUMIN 3.2*    Recent Labs Lab 02/18/16 1821  LIPASE 59*   No results for input(s): AMMONIA in the last 168 hours. Coagulation Profile: No results for input(s): INR, PROTIME in the last 168 hours. Cardiac Enzymes:  Recent Labs Lab 02/18/16 1821  TROPONINI <0.03   BNP (last 3 results) No results for input(s): PROBNP in the last 8760 hours. HbA1C: No results for input(s): HGBA1C in the last 72 hours. CBG: No results for input(s): GLUCAP in the last 168 hours. Lipid Profile: No results for input(s): CHOL, HDL, LDLCALC, TRIG, CHOLHDL, LDLDIRECT in the last 72 hours. Thyroid Function Tests: No results for input(s): TSH, T4TOTAL, FREET4, T3FREE, THYROIDAB in the last 72 hours. Anemia Panel: No results for input(s): VITAMINB12, FOLATE, FERRITIN, TIBC, IRON, RETICCTPCT in the last 72 hours. Urine analysis:    Component Value Date/Time   COLORURINE YELLOW 02/18/2016 1941   APPEARANCEUR CLEAR 02/18/2016 1941   LABSPEC 1.016 02/18/2016 1941   PHURINE 7.5 02/18/2016 1941   GLUCOSEU NEGATIVE 02/18/2016 1941   GLUCOSEU NEGATIVE 07/19/2013 1353   HGBUR NEGATIVE 02/18/2016 1941   BILIRUBINUR NEGATIVE 02/18/2016 1941   KETONESUR NEGATIVE 02/18/2016 1941   PROTEINUR 30* 02/18/2016 1941   UROBILINOGEN 0.2 09/15/2015 1408   NITRITE NEGATIVE 02/18/2016 1941   LEUKOCYTESUR NEGATIVE 02/18/2016 1941   Sepsis Labs: @LABRCNTIP (procalcitonin:4,lacticidven:4) ) Recent Results (from the past 240 hour(s))  Urine culture     Status: None   Collection Time: 02/11/16  7:55 PM  Result Value Ref Range Status   Specimen Description URINE, RANDOM  Final   Special Requests NONE  Final   Culture MULTIPLE SPECIES PRESENT, SUGGEST RECOLLECTION  Final   Report Status 02/13/2016 FINAL  Final     Radiological Exams on Admission: Dg Chest 2 View  02/18/2016  CLINICAL DATA:   Weakness.  Anemia. EXAM: CHEST  2 VIEW COMPARISON:  02/11/2016 FINDINGS: Elevated right hemidiaphragm with bandlike atelectasis along the right lung base. Generally low lung volumes. Atherosclerotic aortic arch. Borderline enlargement of the cardiopericardial silhouette. Degenerative right glenohumeral arthropathy IMPRESSION: 1. Elevated right hemidiaphragm with chronic right basilar atelectasis similar to prior exams. 2. Low lung volumes are  present, causing crowding of the pulmonary vasculature. 3. Borderline enlargement of the cardiopericardial silhouette. 4. Atherosclerotic aortic arch. Electronically Signed   By: Van Clines M.D.   On: 02/18/2016 19:06     EKG: Not done in ED, will get one.   Assessment/Plan Principal Problem:   Symptomatic anemia Active Problems:   Essential hypertension   Pure hypercholesterolemia   Thrombocytopenia, unspecified (HCC)   Hypothyroidism   Chronic diastolic (congestive) heart failure (HCC)   CKD (chronic kidney disease), stage III   Symptomatic anemia: Hgb 6.2 on admission. This is most likely due to worsening anemia from possible myelodysplastic syndrome. Pt has been followed by Dr. Julien Nordmann. Last seen was on 02/03/16. Pt is most likely have myelodysplastic syndrome in addition to anemia of chronic disease per Dr. Julien Nordmann. Pt declined to proceed with the bone marrow biopsy and aspirate for confirmation of his diagnosis. Pt needed frequent blood transfusions recently. Dr. Earlie Server recommended to continue with supportive care and transfusion on as-needed basis based on his symptoms.  -will admit to tele bed for observation and blood transfusion - INR/PTT/type & screen -transfuse 2 units of blood -repeat CBC in AM. -check FOBT  Possible myelodysplastic syndrome: -f/u with Dr. Julien Nordmann.  HTN: -Continue Coreg, oral hydralazine -patient is also on torsemide and metolazone for congestive heart failure  HLD: Last LDL was 47 on 07/17/15 -Continue home  medications: Lipitor  Hypothyroidism: Last TSH was 2.571 on 08/10/15 -Continue home Synthroid  Chronic diastolic (congestive) heart failure ([place]): 2-D echo 09/22/15 showed EF 50-60 percent with grade 1 diastolic dysfunction. His dry weight is about 205 LBs. Today his weight is 207 LBs. Has edema over both elbow and arms, slightly fluid overloaded.  -will give one dose of metolazone 2.5 mg 1 -Continue furosemide 40 mg daily -Check BNP -Continue Coreg  CKD (chronic kidney disease), stage III: stable. Baseline creatinine 1.3-1.8. His creatinine is 1.83 on admission. -Follow-up renal function by BMP   DVT ppx: SCD Code Status: Full code Family Communication: Yes, patient's CNA, Ms. Lamar Sprinkles at bed side Disposition Plan:  Anticipate discharge back to previous home environment tomorrow Consults called: None Admission status:   obs / tele   Date of Service 02/18/2016    Ivor Costa [facility] Pager [phone]  If 7PM-7AM, please contact night-coverage [email or web address] Password [facility] 02/18/2016, 8:48 PM

## 2016-02-18 NOTE — ED Provider Notes (Signed)
CSN: [id number]     Arrival date & time 02/18/16  1741 History   First MD Initiated Contact with Patient 02/18/16 1748     Chief Complaint  Patient presents with  . Weakness     (Consider location/radiation/quality/duration/timing/severity/associated sxs/prior Treatment) HPI  Patient presents with concern of weakness. Patient is here with 2 caregivers who assist with the history of present illness. They note that over the past days, the patient has had progression of generalized weakness. Patient has a history of chronic anemia, has been evaluated multiple times, with provisional diagnosis of myelodysplastic syndrome. Patient was evaluated one week ago for similar concerns. Patient states that since that evaluation he has had worsening symptoms, without new melena, hemoptysis, hematuria. The also denies any falls. Patient self can answer questions, though he mostly corroborates caregivers history.   Past Medical History  Diagnosis Date  . Hypertension   . CAD (coronary artery disease) complete w/u in 2009    extensive coronary calcification by EBCT, normal myoview, echo with EF 70% mild AI  . Hyperlipidemia   . Aortic insufficiency     echo July 2011 EF 60%  . 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 History of GI bleed 2003    due to pyloric-positive ulcer   . Diverticulosis     with remote history of diverticulitis  . Plantar fasciitis     chronic  . Allergic rhinitis     responding well to desensitation injections  . Glaucoma   . Osteoarthritis     both knees  . Asthma   . CHF (congestive heart failure) [facility])    Past Surgical History  Procedure Laterality Date  . Tonsillectomy    . Hemorrhoid surgery  1996  . Hernia repair  1996    right  . Lumbar laminectomy  2003  . Cataract extraction      left eye   Family History  Problem Relation Age of Onset  . Thrombosis Father     mesenteric artery thrombosis after prostate surgery  . Colon cancer  Mother    Social History  Substance Use Topics  . Smoking status: Never Smoker   . Smokeless tobacco: None  . Alcohol Use: No    Review of Systems  Constitutional:       Per HPI, otherwise negative  HENT:       Per HPI, otherwise negative  Respiratory:       Per HPI, otherwise negative  Cardiovascular:       Per HPI, otherwise negative  Gastrointestinal: Negative for vomiting.  Endocrine:       Negative aside from HPI  Genitourinary:       Neg aside from HPI   Musculoskeletal:       Per HPI, otherwise negative  Skin: Positive for pallor.  Neurological: Positive for weakness. Negative for syncope.      Allergies  Other and Tetanus toxoids  Home Medications   Prior to Admission medications   Medication Sig Start Date End Date Taking? Authorizing Provider  acetaminophen (TYLENOL) 325 MG tablet Take 975 mg by mouth every morning.     Historical Provider, MD  atorvastatin (LIPITOR) 10 MG tablet Take 1 tablet by mouth  daily 07/09/15   [name], MD  azelastine (ASTELIN) 0.1 % nasal spray Place 1 spray into both nostrils 2 (two) times daily. Use in each nostril as directed 08/20/14   [name], MD  brimonidine (ALPHAGAN) 0.15 %  ophthalmic solution Place 1 drop into the left eye 2 (two) times daily.     Historical Provider, MD  carvedilol (COREG) 6.25 MG tablet Take 1 tablet (6.25 mg total) by mouth 2 (two) times daily with a meal. 09/22/15   Debbe Odea, MD  diazepam (VALIUM) 2 MG tablet Take 0.5 tablets (1 mg total) by mouth at bedtime. 10/11/15   [name], MD  dorzolamide (TRUSOPT) 2 % ophthalmic solution Place 1 drop into the left eye 2 (two) times daily.     Historical Provider, MD  EPIPEN 2-PAK 0.3 MG/0.3ML SOAJ injection Inject 0.71ml (0.3mg ) into  the muscle once as directed 12/26/14   [name], MD  ferrous sulfate 325 (65 FE) MG tablet Take 325 mg by mouth every evening.     Historical Provider, MD  fexofenadine (ALLEGRA) 30 MG tablet Take 45 mg by mouth daily.     Historical Provider, MD  fluticasone Asencion Islam) 50 MCG/ACT nasal spray Use 2 sprays in each  nostril twice daily 08/21/15   [name], MD  glucosamine-chondroitin 500-400 MG tablet Take 1 tablet by mouth 2 (two) times daily.     Historical Provider, MD  hydrALAZINE (APRESOLINE) 25 MG tablet Take 1 tablet (25 mg total) by mouth 3 (three) times daily. 12/09/15   Ripudeep Krystal Eaton, MD  levothyroxine (SYNTHROID, LEVOTHROID) 50 MCG tablet Take 1 tablet by mouth  daily 07/01/15   [name], MD  metolazone (ZAROXOLYN) 2.5 MG tablet Take 1 tablet by mouth every Monday and Friday.  Hold if weight is 200lbs or less. 12/26/15   Jolaine Artist, MD  Multiple Vitamin (MULTIVITAMIN WITH MINERALS) TABS tablet Take 1 tablet by mouth daily.    Historical Provider, MD  pantoprazole (PROTONIX) 40 MG tablet Take 1 tablet by mouth  daily 09/25/15   [name], MD  potassium chloride 20 MEQ/15ML (10%) SOLN Take 40 mEq by mouth 2 (two) times a week. Take on Monday and Friday with metolazone    Historical Provider, MD  Probiotic Product (ALIGN) 4 MG CAPS Take 4 mg by mouth every evening.    Historical Provider, MD  torsemide (DEMADEX) 20 MG tablet Take 40 mg by mouth daily.    Historical Provider, MD   BP 144/69 mmHg  Pulse 66  Temp(Src) 98.2 F (36.8 C) (Oral)  Resp 16  Ht 5\' 10"  (1.778 m)  Wt 200 lb (90.719 kg)  BMI 28.70 kg/m2  SpO2 98% Physical Exam  Constitutional: He is oriented to person, place, and time. He has a sickly appearance. No distress.  HENT:  Head: Normocephalic and atraumatic.  Eyes: Conjunctivae and EOM are normal.  Cardiovascular: Normal rate and regular rhythm.   Pulmonary/Chest: Effort normal. No stridor. No respiratory distress.  Abdominal: He exhibits no distension.  Musculoskeletal: He exhibits no edema.  Neurological: He is alert and oriented to person, place, and time.  Skin: Skin is warm and dry. There is pallor.  Psychiatric: He has a normal mood and affect.  Nursing note and  vitals reviewed.   ED Course  Procedures (including critical care time) Labs Review Labs Reviewed  COMPREHENSIVE METABOLIC PANEL - Abnormal; Notable for the following:    Glucose, Bld 101 (*)    BUN 39 (*)    Creatinine, Ser 1.83 (*)    Total Protein 6.4 (*)    Albumin 3.2 (*)    GFR calc non Af Amer 30 (*)    GFR calc Af Amer 35 (*)    All  other components within normal limits  LIPASE, BLOOD - Abnormal; Notable for the following:    Lipase 59 (*)    All other components within normal limits  BRAIN NATRIURETIC PEPTIDE - Abnormal; Notable for the following:    B Natriuretic Peptide 354.7 (*)    All other components within normal limits  CBC WITH DIFFERENTIAL/PLATELET - Abnormal; Notable for the following:    WBC 28.9 (*)    RBC 1.95 (*)    Hemoglobin 6.2 (*)    HCT 19.8 (*)    MCV 101.5 (*)    RDW 20.6 (*)    Platelets 34 (*)    All other components within normal limits  URINALYSIS, ROUTINE W REFLEX MICROSCOPIC (NOT AT [facility]) - Abnormal; Notable for the following:    Protein, ur 30 (*)    All other components within normal limits  URINE MICROSCOPIC-ADD ON - Abnormal; Notable for the following:    Squamous Epithelial / LPF 0-5 (*)    Bacteria, UA RARE (*)    All other components within normal limits  TROPONIN I  PROTIME-INR  APTT  BASIC METABOLIC PANEL  CBC  I-STAT CG4 LACTIC ACID, ED  TYPE AND SCREEN  PREPARE RBC (CROSSMATCH)    Imaging Review Dg Chest 2 View  02/18/2016  CLINICAL DATA:  Weakness.  Anemia. EXAM: CHEST  2 VIEW COMPARISON:  02/11/2016 FINDINGS: Elevated right hemidiaphragm with bandlike atelectasis along the right lung base. Generally low lung volumes. Atherosclerotic aortic arch. Borderline enlargement of the cardiopericardial silhouette. Degenerative right glenohumeral arthropathy IMPRESSION: 1. Elevated right hemidiaphragm with chronic right basilar atelectasis similar to prior exams. 2. Low lung volumes are present, causing crowding of the pulmonary  vasculature. 3. Borderline enlargement of the cardiopericardial silhouette. 4. Atherosclerotic aortic arch. Electronically Signed   By: Van Clines M.D.   On: 02/18/2016 19:06   I have personally reviewed and evaluated these images and lab results as part of my medical decision-making.   Chart review notable for multiple similar evaluations, and recent evaluation with oncology, discussing merits of biopsy for possibility of myelodysplasia  Initial labs notable for anemia, hemolytic woman 6.2. Patient also has leukocytosis. On repeat exam he remains in similar condition. No fever, no obvious source of infection. Leukocytosis may be secondary to the patient's myelodysplastic disease MDM  Patient presents with weakness. Notably, the patient has a history of myelo dysplastic disease, and has episodes of anemia in the past. Here, the patient is awake and alert, though generally ill-appearing. Patient is found to have anemia, 6.2, requiring blood transfusion. No evidence for ongoing exsanguination, and the patient's history suggests insufficient production. No evidence for ongoing infection, though the patient does have mild leukocytosis. Patient was started on transfusion, admitted for further evaluation and management.  CRITICAL CARE Performed by: Carmin Muskrat Total critical care time: 40 minutes Critical care time was exclusive of separately billable procedures and treating other patients. Critical care was necessary to treat or prevent imminent or life-threatening deterioration. Critical care was time spent personally by me on the following activities: development of treatment plan with patient and/or surrogate as well as nursing, discussions with consultants, evaluation of patient's response to treatment, examination of patient, obtaining history from patient or surrogate, ordering and performing treatments and interventions, ordering and review of laboratory studies, ordering and  review of radiographic studies, pulse oximetry and re-evaluation of patient's condition.   Carmin Muskrat, MD 02/18/16 2337

## 2016-02-19 ENCOUNTER — Encounter (HOSPITAL_COMMUNITY): Payer: Self-pay | Admitting: Nurse Practitioner

## 2016-02-19 ENCOUNTER — Encounter (HOSPITAL_COMMUNITY): Payer: Medicare Other

## 2016-02-19 DIAGNOSIS — D649 Anemia, unspecified: Secondary | ICD-10-CM

## 2016-02-19 DIAGNOSIS — E785 Hyperlipidemia, unspecified: Secondary | ICD-10-CM | POA: Diagnosis not present

## 2016-02-19 DIAGNOSIS — N183 Chronic kidney disease, stage 3 (moderate): Secondary | ICD-10-CM

## 2016-02-19 DIAGNOSIS — I10 Essential (primary) hypertension: Secondary | ICD-10-CM

## 2016-02-19 DIAGNOSIS — E039 Hypothyroidism, unspecified: Secondary | ICD-10-CM

## 2016-02-19 DIAGNOSIS — I5032 Chronic diastolic (congestive) heart failure: Secondary | ICD-10-CM | POA: Diagnosis not present

## 2016-02-19 DIAGNOSIS — R531 Weakness: Secondary | ICD-10-CM | POA: Diagnosis not present

## 2016-02-19 DIAGNOSIS — I2510 Atherosclerotic heart disease of native coronary artery without angina pectoris: Secondary | ICD-10-CM | POA: Diagnosis not present

## 2016-02-19 LAB — BASIC METABOLIC PANEL
Anion gap: 13 (ref 5–15)
BUN: 40 mg/dL — ABNORMAL HIGH (ref 6–20)
CALCIUM: 9.2 mg/dL (ref 8.9–10.3)
CO2: 28 mmol/L (ref 22–32)
CREATININE: 1.73 mg/dL — AB (ref 0.61–1.24)
Chloride: 103 mmol/L (ref 101–111)
GFR calc non Af Amer: 32 mL/min — ABNORMAL LOW (ref >60)
GFR, EST AFRICAN AMERICAN: 37 mL/min — AB (ref >60)
Glucose, Bld: 100 mg/dL — ABNORMAL HIGH (ref 65–99)
Potassium: 3.3 mmol/L — ABNORMAL LOW (ref 3.5–5.1)
SODIUM: 144 mmol/L (ref 135–145)

## 2016-02-19 LAB — CBC
HCT: 25.2 % — ABNORMAL LOW (ref 39.0–52.0)
Hemoglobin: 8.3 g/dL — ABNORMAL LOW (ref 13.0–17.0)
MCH: 31.7 pg (ref 26.0–34.0)
MCHC: 32.9 g/dL (ref 30.0–36.0)
MCV: 96.2 fL (ref 78.0–100.0)
PLATELETS: 31 10*3/uL — AB (ref 150–400)
RBC: 2.62 MIL/uL — AB (ref 4.22–5.81)
RDW: 20.4 % — ABNORMAL HIGH (ref 11.5–15.5)
WBC: 28.2 10*3/uL — AB (ref 4.0–10.5)

## 2016-02-19 MED ORDER — ALTEPLASE 2 MG IJ SOLR
INTRAMUSCULAR | Status: AC
  Filled 2016-02-19: qty 2

## 2016-02-19 MED ORDER — LEVOFLOXACIN IN D5W 750 MG/150ML IV SOLN
750.0000 mg | INTRAVENOUS | Status: DC
  Administered 2016-02-19: 750 mg via INTRAVENOUS
  Filled 2016-02-19 (×2): qty 150

## 2016-02-19 NOTE — Progress Notes (Signed)
[facility] PROGRESS NOTE  STEPHANE NIEMANN FTD:[id number] DOB: [birth year] DOA: 02/18/2016 PCP: Velna Hatchet, MD  Assessment/Plan: Symptomatic anemia: Hgb 6.2 on admission. This is most likely due to worsening anemia from possible myelodysplastic syndrome. Pt has been followed by Dr. Julien Nordmann. Last seen was on 02/03/16. Pt is most likely have myelodysplastic syndrome in addition to anemia of chronic disease per Dr. Julien Nordmann. Pt declined to proceed with the bone marrow biopsy and aspirate for confirmation of his diagnosis. Pt needed frequent blood transfusions recently. Dr. Earlie Server recommended to continue with supportive care and transfusion on as-needed basis based on his symptoms.  -will admit to tele bed for observation and blood transfusion Transfused 2 units of PRBC Improved Hb 6.3-8.3   Generalized weakness Pt's WBC worsened from 8K in 1 month back to 28K today ? Pneumonia Afebrile Start on levofloxacin and follow up with CBC  Possible myelodysplastic syndrome: -f/u with Dr. Julien Nordmann.  HTN: -Continue Coreg, oral hydralazine -patient is also on torsemide and metolazone for congestive heart failure Pt is euvolemic  HLD: Last LDL was 47 on 07/17/15 -Continue home medications: Lipitor  Hypothyroidism: Last TSH was 2.571 on 08/10/15 -Continue home Synthroid  Chronic diastolic (congestive) heart failure ([place]): 2-D echo 09/22/15 showed EF 50-60 percent with grade 1 diastolic dysfunction. His dry weight is about 205 LBs. Today his weight is 207 LBs. Has edema over both elbow and arms, slightly fluid overloaded.  -will give one dose of metolazone 2.5 mg 1 -Continue furosemide 40 mg daily -Check BNP -Continue Coreg  CKD (chronic kidney disease), stage III: stable. Baseline creatinine 1.3-1.8. His creatinine is 1.83 on admission. -Follow-up renal function by BMP  Severe debility Patient is bedbound at baseline  DVT ppx: SCD Code Status: Full code Family Communication: Yes,  patient's CNA, Ms. Lamar Sprinkles at bed side Disposition Plan: Anticipate discharge back to previous home environment tomorrow Consults called: None Admission status: obs / tele  Code Status: full code Family Communication: Son(indicate person spoken with, relationship, and if by phone, the number) Disposition Plan: F/u Am with CBC    Antibiotics: Levofloxacin 1/7 HPI/Subjective: With h/o myelodysplastic syndrome comes with generalized weakness, increased sleepiness. Was found to have Hb 6.3 and WBC count 28,000.  Objective: Filed Vitals:   02/19/16 0359 02/19/16 0633  BP: 127/57 140/66  Pulse: 61 62  Temp: 98.8 F (37.1 C) 98.6 F (37 C)  Resp: 20 18    Intake/Output Summary (Last 24 hours) at 02/19/16 0851 Last data filed at 02/19/16 0823  Gross per 24 hour  Intake   1655 ml  Output   1175 ml  Net    480 ml   Filed Weights   02/18/16 1745 02/18/16 2146  Weight: 90.719 kg (200 lb) 94.484 kg (208 lb 4.8 oz)    Exam:  General: Not in acute distress HEENT:  Eyes: PERRL, EOMI, no scleral icterus.  ENT: No discharge from the ears and nose, no pharynx injection, no tonsillar enlargement.   Neck: No JVD, no bruit, no mass felt. Heme: No neck lymph node enlargement. Cardiac: S1/S2, RRR, No murmurs, No gallops or rubs. Pulm: decreased air movement bilaterally. No rales, wheezing, rhonchi or rubs. Abd: Soft, nondistended, nontender, no rebound pain, no organomegaly, BS present. GU: No hematuria Ext: No pitting leg edema bilaterally. 2+DP/PT pulse bilaterally. Has edema over both elbows and arms. Musculoskeletal: No joint deformities, No joint redness or warmth, no limitation of ROM in spin. Skin: No rashes. Decubitus ulcers Neuro: Alert, oriented X3, cranial  nerves II-XII grossly intact, moves all extremities normally. Psych: Patient is not psychotic, no suicidal or hemocidal ideation. Data Reviewed: Basic Metabolic Panel:  Recent Labs Lab  02/18/16 1821  NA 142  K 3.9  CL 104  CO2 28  GLUCOSE 101*  BUN 39*  CREATININE 1.83*  CALCIUM 9.1   Liver Function Tests:  Recent Labs Lab 02/18/16 1821  AST 33  ALT 18  ALKPHOS 54  BILITOT 0.8  PROT 6.4*  ALBUMIN 3.2*    Recent Labs Lab 02/18/16 1821  LIPASE 59*   No results for input(s): AMMONIA in the last 168 hours. CBC:  Recent Labs Lab 02/18/16 1821 02/19/16 0731  WBC 28.9* 28.2*  HGB 6.2* 8.3*  HCT 19.8* 25.2*  MCV 101.5* 96.2  PLT 34* 31*   Cardiac Enzymes:  Recent Labs Lab 02/18/16 1821  TROPONINI <0.03   BNP (last 3 results)  Recent Labs  11/04/15 1607 12/03/15 0130 02/18/16 1821  BNP 162.6* 240.6* 354.7*    ProBNP (last 3 results) No results for input(s): PROBNP in the last 8760 hours.  CBG: No results for input(s): GLUCAP in the last 168 hours.  Recent Results (from the past 240 hour(s))  Urine culture     Status: None   Collection Time: 02/11/16  7:55 PM  Result Value Ref Range Status   Specimen Description URINE, RANDOM  Final   Special Requests NONE  Final   Culture MULTIPLE SPECIES PRESENT, SUGGEST RECOLLECTION  Final   Report Status 02/13/2016 FINAL  Final     Studies: Dg Chest 2 View  02/18/2016  CLINICAL DATA:  Weakness.  Anemia. EXAM: CHEST  2 VIEW COMPARISON:  02/11/2016 FINDINGS: Elevated right hemidiaphragm with bandlike atelectasis along the right lung base. Generally low lung volumes. Atherosclerotic aortic arch. Borderline enlargement of the cardiopericardial silhouette. Degenerative right glenohumeral arthropathy IMPRESSION: 1. Elevated right hemidiaphragm with chronic right basilar atelectasis similar to prior exams. 2. Low lung volumes are present, causing crowding of the pulmonary vasculature. 3. Borderline enlargement of the cardiopericardial silhouette. 4. Atherosclerotic aortic arch. Electronically Signed   By: Van Clines M.D.   On: 02/18/2016 19:06    Scheduled Meds: . sodium chloride  10 mL/hr  Intravenous Once  . acetaminophen  975 mg Oral q morning - 10a  . alteplase      . atorvastatin  10 mg Oral q1800  . azelastine  1 spray Each Nare BID  . brimonidine  1 drop Left Eye BID  . carvedilol  6.25 mg Oral BID WC  . diazepam  1 mg Oral QHS  . dorzolamide  1 drop Left Eye BID  . ferrous sulfate  325 mg Oral QPM  . fluticasone  1 spray Each Nare Daily  . hydrALAZINE  25 mg Oral TID  . levothyroxine  50 mcg Oral QAC breakfast  . loratadine  10 mg Oral Daily  . multivitamin with minerals  1 tablet Oral Daily  . pantoprazole  40 mg Oral Daily  . saccharomyces boulardii  250 mg Oral QPM  . sodium chloride flush  3 mL Intravenous Q12H  . torsemide  40 mg Oral Daily   Continuous Infusions:   Principal Problem:   Symptomatic anemia Active Problems:   Essential hypertension   Pure hypercholesterolemia   Thrombocytopenia, unspecified (HCC)   Hypothyroidism   Chronic diastolic (congestive) heart failure (HCC)   CKD (chronic kidney disease), stage III    Time spent: 15 minutes    [facility]  Pager [phone]. If 7PM-7AM, please contact night-coverage at [email or web address], password [facility] 02/19/2016, 8:51 AM

## 2016-02-19 NOTE — Evaluation (Addendum)
Physical Therapy Evaluation Patient Details Name: Victor Green MRN: [id number] DOB: 1920-03-31 Today's Date: 02/19/2016   History of Present Illness  82 y.o. male with medical history significant of persistent anemia and thrombocytopenia questionable for myelodysplastic syndrome per his oncologist, Dr. Julien Nordmann, HTN, hyperlipidemia, asthma, GERD, hypothyroidism, CAD, aortic insufficiency, GI bleeding secondary to pyloric-positive ulcer, dCHF, glaucoma, diverticulosis, chronic kidney disease-stage III, who presents with generalized weakness.     Clinical Impression  Pt admitted with above diagnosis. Pt currently with functional limitations due to the deficits listed below (see PT Problem List). Pt has been using mechanical lift with +2-3 CNA's at the home 24/7. Pt has been getting up to w/c or recliner for meals and during the day and uses a hospital bed. Pt will benefit from skilled PT to increase their independence and safety with mobility to allow discharge to the venue listed below with focus on upright tolerance, sitting balance/endurance, and general strengthening to increase ability to assist caregivers. Pt's CNA and family reports he was receiving PT/OT services and has been able to perform some slidingboard transfers at times.        Follow Up Recommendations Home health PT;Supervision/Assistance - 24 hour    Equipment Recommendations  None recommended by PT    Recommendations for Other Services       Precautions / Restrictions Precautions Precautions: Fall Restrictions Weight Bearing Restrictions: No      Mobility  Bed Mobility Overal bed mobility: Needs Assistance Bed Mobility: Rolling;Sit to Supine;Supine to Sit Rolling: Supervision (use of bed rails)   Supine to sit: Min assist Sit to supine: Min assist   General bed mobility comments: Pt uses HOB elevated and rails; requires intermittent min assist for initiation and balance.   Transfers Overall transfer level:  Needs assistance               General transfer comment: Pt uses standing lift at home for any OOB (1-2 x day). Pt declined use of lift this session.  Ambulation/Gait             General Gait Details: Pt has been unable to ambulate "for awhile now"  Stairs            Wheelchair Mobility    Modified Rankin (Stroke Patients Only)       Balance Overall balance assessment: Needs assistance Sitting-balance support: Feet supported;Single extremity supported Sitting balance-Leahy Scale: Fair Sitting balance - Comments: Pt maintaining sitting balance EOB x 5 min while engaging in therapeutic conversation and seated LE therex.                                      Pertinent Vitals/Pain Pain Assessment: Faces Faces Pain Scale: Hurts a little bit Pain Location: generalized    Home Living Family/patient expects to be discharged to:: Private residence Living Arrangements: Other (Comment) (24/7 hired caregivers; son local) Available Help at Discharge: Family;Available 24 hours/day;Personal care attendant Type of Home: House Home Access: Ramped entrance     Home Layout: Able to live on main level with bedroom/bathroom Home Equipment: Hospital bed;Wheelchair - manual;Other (comment) (mechanical lift; slideboard) Additional Comments: Pt using lift for all OOB activity    Prior Function Level of Independence: Needs assistance   Gait / Transfers Assistance Needed: Has been unable "for awhile now" - uses standing mechanical lift for all OOB  ADL's / Homemaking Assistance Needed: 24/7 hired CNA  Hand Dominance        Extremity/Trunk Assessment   Upper Extremity Assessment: Defer to OT evaluation           Lower Extremity Assessment: Generalized weakness (reports bone on bone in B knees; pt non ambulatory)      Cervical / Trunk Assessment: Kyphotic  Communication   Communication: HOH  Cognition Arousal/Alertness:  Awake/alert Behavior During Therapy: WFL for tasks assessed/performed Overall Cognitive Status: Within Functional Limits for tasks assessed                      General Comments General comments (skin integrity, edema, etc.): significant edema in BUE and BLE; CNA present and performing positioning with pillows appropriately for skin protection. HR = 67 bpm and O2 = 95% during EOB activity, no SOB noted.    Exercises General Exercises - Lower Extremity Long Arc Quad: Strengthening;Both;10 reps;Seated      Assessment/Plan    PT Assessment Patient needs continued PT services  PT Diagnosis Difficulty walking;Generalized weakness   PT Problem List Decreased strength;Decreased range of motion;Decreased activity tolerance;Decreased balance;Decreased mobility;Cardiopulmonary status limiting activity;Decreased skin integrity;Pain  PT Treatment Interventions Functional mobility training;Therapeutic activities;Therapeutic exercise;Balance training;Patient/family education;DME instruction   PT Goals (Current goals can be found in the Care Plan section) Acute Rehab PT Goals Patient Stated Goal: go home today PT Goal Formulation: With patient/family Time For Goal Achievement: 02/26/16 Potential to Achieve Goals: Fair    Frequency Min 2X/week   Barriers to discharge        Co-evaluation               End of Session Equipment Utilized During Treatment: Oxygen Activity Tolerance: Patient limited by fatigue Patient left: in bed;with call bell/phone within reach;Other (comment) (Pt's personal CNA in room) Nurse Communication: Mobility status;Need for lift equipment    Functional Assessment Tool Used: clinical judgement Functional Limitation: Changing and maintaining body position Changing and Maintaining Body Position Current Status [phone]): At least 60 percent but less than 80 percent impaired, limited or restricted Changing and Maintaining Body Position Goal Status [id number]):  At least 20 percent but less than 40 percent impaired, limited or restricted    Time: 1020-1045 PT Time Calculation (min) (ACUTE ONLY): 25 min   Charges:   PT Evaluation $PT Eval High Complexity: 1 Procedure PT Treatments $Therapeutic Activity: 8-22 mins   PT G Codes:   PT G-Codes **NOT FOR INPATIENT CLASS** Functional Assessment Tool Used: clinical judgement Functional Limitation: Changing and maintaining body position Changing and Maintaining Body Position Current Status [id number]): At least 60 percent but less than 80 percent impaired, limited or restricted Changing and Maintaining Body Position Goal Status [id number]): At least 20 percent but less than 40 percent impaired, limited or restricted    Juanna Cao, PT, DPT Pager #: [phone]  02/19/2016, 11:06 AM

## 2016-02-20 ENCOUNTER — Encounter (HOSPITAL_COMMUNITY): Payer: Medicare Other

## 2016-02-20 DIAGNOSIS — L8990 Pressure ulcer of unspecified site, unspecified stage: Secondary | ICD-10-CM | POA: Insufficient documentation

## 2016-02-20 DIAGNOSIS — I5032 Chronic diastolic (congestive) heart failure: Secondary | ICD-10-CM | POA: Diagnosis not present

## 2016-02-20 DIAGNOSIS — D649 Anemia, unspecified: Secondary | ICD-10-CM | POA: Diagnosis not present

## 2016-02-20 DIAGNOSIS — I10 Essential (primary) hypertension: Secondary | ICD-10-CM | POA: Diagnosis not present

## 2016-02-20 DIAGNOSIS — N183 Chronic kidney disease, stage 3 (moderate): Secondary | ICD-10-CM | POA: Diagnosis not present

## 2016-02-20 LAB — TYPE AND SCREEN
ABO/RH(D): O POS
ANTIBODY SCREEN: POSITIVE
DAT, IgG: NEGATIVE
PT AG TYPE: POSITIVE
UNIT DIVISION: 0
Unit division: 0

## 2016-02-20 LAB — BASIC METABOLIC PANEL
Anion gap: 18 — ABNORMAL HIGH (ref 5–15)
BUN: 45 mg/dL — AB (ref 6–20)
CHLORIDE: 98 mmol/L — AB (ref 101–111)
CO2: 27 mmol/L (ref 22–32)
CREATININE: 1.88 mg/dL — AB (ref 0.61–1.24)
Calcium: 9.1 mg/dL (ref 8.9–10.3)
GFR, EST AFRICAN AMERICAN: 34 mL/min — AB (ref >60)
GFR, EST NON AFRICAN AMERICAN: 29 mL/min — AB (ref >60)
Glucose, Bld: 92 mg/dL (ref 65–99)
Potassium: 2.6 mmol/L — CL (ref 3.5–5.1)
SODIUM: 143 mmol/L (ref 135–145)

## 2016-02-20 LAB — CBC
HCT: 26.7 % — ABNORMAL LOW (ref 39.0–52.0)
Hemoglobin: 8.9 g/dL — ABNORMAL LOW (ref 13.0–17.0)
MCH: 31.6 pg (ref 26.0–34.0)
MCHC: 33.3 g/dL (ref 30.0–36.0)
MCV: 94.7 fL (ref 78.0–100.0)
PLATELETS: 33 10*3/uL — AB (ref 150–400)
RBC: 2.82 MIL/uL — AB (ref 4.22–5.81)
RDW: 19.9 % — AB (ref 11.5–15.5)
WBC: 24.5 10*3/uL — AB (ref 4.0–10.5)

## 2016-02-20 MED ORDER — POTASSIUM CHLORIDE CRYS ER 20 MEQ PO TBCR: 40.0000 meq | EXTENDED_RELEASE_TABLET | Freq: Once | ORAL | Status: DC

## 2016-02-20 MED ORDER — LEVOFLOXACIN 500 MG PO TABS: 500.0000 mg | ORAL_TABLET | Freq: Every day | ORAL | Status: DC

## 2016-02-20 MED ORDER — POTASSIUM CHLORIDE 20 MEQ/15ML (10%) PO SOLN
40.0000 meq | Freq: Every day | ORAL | Status: DC
  Administered 2016-02-20: 40 meq via ORAL
  Filled 2016-02-20: qty 30

## 2016-02-20 MED ORDER — POTASSIUM CHLORIDE 20 MEQ/15ML (10%) PO SOLN: 40.0000 meq | ORAL | Status: AC

## 2016-02-20 MED ORDER — POTASSIUM CHLORIDE 20 MEQ PO PACK: 40.0000 meq | PACK | Freq: Once | ORAL | Status: DC

## 2016-02-20 MED ORDER — POTASSIUM CHLORIDE 20 MEQ/15ML (10%) PO SOLN
40.0000 meq | Freq: Once | ORAL | Status: AC
  Administered 2016-02-20: 40 meq via ORAL
  Filled 2016-02-20: qty 30

## 2016-02-20 NOTE — Care Management Note (Signed)
Case Management Note  Patient Details  Name: Victor Green MRN: [id number] Date of Birth: [birth year]  Subjective/Objective: Admitted with Symptomatic Anemia                 Action/Plan: CM talked to patient's son Shanon Brow. Patient has a private [facility] and aide through [place] also private caregivers at home. They have a special van to transport the patient, hoyar lift, wheelchair, son reports that he has tons of equipment at home and they take care of all of his needs.Son Shanon Brow reports no needs at this time, just wants to know when can they take him home.  Expected Discharge Date:    possibly 02/20/2016              Expected Discharge Plan:  Home with private home health care services  and private sitters  Discharge planning Services  CM Consult  Choice offered to:  Adult Children  Status of Service:  Completed, signed off  Sherrilyn Rist [phone]  02/20/2016, 11:42 AM

## 2016-02-20 NOTE — Progress Notes (Signed)
Critical Lab received K 2.6 MD made aware. Dose of K given.

## 2016-02-20 NOTE — Evaluation (Signed)
Occupational Therapy Evaluation Patient Details Name: Victor Green MRN: [id number] DOB: 1920-10-07 Today's Date: 02/20/2016    History of Present Illness 82 y.o. male with medical history significant of persistent anemia and thrombocytopenia questionable for myelodysplastic syndrome per his oncologist, Dr. Julien Nordmann, HTN, hyperlipidemia, asthma, GERD, hypothyroidism, CAD, aortic insufficiency, GI bleeding secondary to pyloric-positive ulcer, dCHF, glaucoma, diverticulosis, chronic kidney disease-stage III, who presents with generalized weakness.   Clinical Impression   Patient presenting with decreased ADL and functional mobility independence secondary to above. Patient required max to total assist for bathing and LB ADLs, total assist +2-3 for OOB mobility using mechanical lift, set-up for UB ADLs and grooming tasks while seated EOB PTA. Patient currently functioning close to baseline, but requires increased assistance with bed mobility and presents with decreased overall activity tolerance/endurance. Patient will benefit from acute OT to increase overall independence with functional transfers to [facility] using mechanical lift (educating pt and caregivers on this), education on BUE HEP to improve overall functional strength, and increase independence with bed mobility in order to decrease burden of care and to ensure safe discharge back home with 24/7 caregivers and [place].     Follow Up Recommendations  Home health OT;Supervision/Assistance - 24 hour    Equipment Recommendations  None recommended by OT    Recommendations for Other Services  None at this time  Precautions / Restrictions Precautions Precautions: Fall Restrictions Weight Bearing Restrictions: No     Mobility Bed Mobility Overal bed mobility: Needs Assistance Bed Mobility: Rolling;Sit to Supine;Supine to Sit Rolling: Min assist   Supine to sit: Min assist Sit to supine: Mod assist   General bed mobility comments: Pt uses  HOB elevated and rails; requires intermittent min assist for initiation and balance. Mod assist for sit to supine to assist with management of BLEs. Min assist for rolling left to/from right to re-position bed pad/chuck  Transfers Overall transfer level: Needs assistance General transfer comment: Pt uses standing lift at home for any OOB (1-2 x day). Pt declined use of lift this session.    Balance Overall balance assessment: Needs assistance Sitting-balance support: Feet supported;Single extremity supported Sitting balance-Leahy Scale: Fair Sitting balance - Comments: Pt maintaining sitting balance EOB x 5 min while engaging in grooming task of brushing teeth and conversation with therapist and caregivers     ADL Overall ADL's : Needs assistance/impaired General ADL Comments: Pt required total assist for bathing PTA, pt able to don shirt with set-up and performing grooming tasks with set-up assist. Pt found supine in bed. WIth min-mod encouragement pt willing to work with therapist. Pt engaged in bed mobility, sat EOB with min assist for grooming tasks of brushing teeth. Pt eager to lay back down in supine after brushing his teeth. Pt required mod assist for sit to supine, then needed assistance with positioning in bed. +2 present for bed mobility for safety.     Vision Vision Assessment?: No apparent visual deficits          Pertinent Vitals/Pain Pain Assessment: No/denies pain Faces Pain Scale: No hurt     Hand Dominance Right   Extremity/Trunk Assessment Upper Extremity Assessment Upper Extremity Assessment: Generalized weakness   Lower Extremity Assessment Lower Extremity Assessment: Defer to PT evaluation   Cervical / Trunk Assessment Cervical / Trunk Assessment: Kyphotic   Communication Communication Communication: HOH   Cognition Arousal/Alertness: Awake/alert Behavior During Therapy: WFL for tasks assessed/performed Overall Cognitive Status: Within Functional  Limits for tasks assessed  Home Living Family/patient expects to be discharged to:: Private residence Living Arrangements: Other (Comment) (24/7 hired caregivers, son local) Available Help at Discharge: Family;Available 24 hours/day;Personal care attendant Type of Home: House Home Access: Ramped entrance     Home Layout: Able to live on main level with bedroom/bathroom     Bathroom Shower/Tub:  (n/a, caregivers assist with bed baths)   Bathroom Toilet:  (n/a, pt uses bed pan. Caregivers report they plan on assisting him to/from [facility])     Home Equipment: Hospital bed;Wheelchair - manual;Other (comment) (mechanical lift, slide board)   Additional Comments: Pt using lift for all OOB activity      Prior Functioning/Environment Level of Independence: Needs assistance  Gait / Transfers Assistance Needed: Has been unable "for awhile now" - uses standing mechanical lift for all OOB ADL's / Homemaking Assistance Needed: 24/7 hired CNA     OT Diagnosis: Generalized weakness   OT Problem List: Decreased strength;Decreased range of motion;Decreased activity tolerance;Impaired balance (sitting and/or standing);Decreased safety awareness;Decreased knowledge of use of DME or AE;Decreased knowledge of precautions   OT Treatment/Interventions: Therapeutic exercise;Energy conservation;DME and/or AE instruction;Therapeutic activities;Patient/family education;Balance training    OT Goals(Current goals can be found in the care plan section) Acute Rehab OT Goals Patient Stated Goal: none stated OT Goal Formulation: With patient/family Time For Goal Achievement: 02/27/16 Potential to Achieve Goals: Good ADL Goals Pt Will Transfer to Toilet: bedside commode;with total assist;with +2 assist (using lift) Pt/caregiver will Perform Home Exercise Program: Increased strength;Increased ROM;Both right and left upper extremity;With minimal assist;With written HEP provided Additional ADL  Goal #1: Pt will be supervision for bed mobility as a precursor for ADL and to decrease burden of care   OT Frequency: Min 2X/week   Barriers to D/C: None known at this time   End of Session Equipment Utilized During Treatment: Oxygen  Activity Tolerance: Patient tolerated treatment well Patient left: in bed;with call bell/phone within reach;with family/visitor present;with nursing/sitter in room   Time: 0911-0935 OT Time Calculation (min): 24 min Charges:  OT General Charges $OT Visit: 1 Procedure OT Evaluation $OT Eval Moderate Complexity: 1 Procedure OT Treatments $Self Care/Home Management : 8-22 mins G-Codes: OT G-codes **NOT FOR INPATIENT CLASS** Functional Assessment Tool Used: clinical judgement  Functional Limitation: Self care Self Care Current Status [id number]): At least 80 percent but less than 100 percent impaired, limited or restricted Self Care Goal Status [id number]): At least 80 percent but less than 100 percent impaired, limited or restricted  Chrys Racer , MS, OTR/L, CLT Pager: [phone]  02/20/2016, 9:52 AM

## 2016-02-20 NOTE — Progress Notes (Signed)
Patient and caregivers given discharge instructions and all questions answered.  Caregiver stated we don't call the son, Shanon Brow, unless an emergency and she just talked to him on the phone.  Patient discharged with all belongings via wheelchair.

## 2016-02-20 NOTE — Discharge Summary (Signed)
Physician Discharge Summary  Victor Green DTO:[id number] DOB: 23-Mar-1920 DOA: 02/18/2016  PCP: Velna Hatchet, MD  Admit date: 02/18/2016 Discharge date: 02/20/2016  Time spent: 35 minutes  Recommendations for Outpatient Follow-up:  1. Follow-up with primary care physician in one week  Discharge Diagnoses:  Principal Problem:   Symptomatic anemia Active Problems:   Essential hypertension   Pure hypercholesterolemia   Thrombocytopenia, unspecified (HCC)   Hypothyroidism   Chronic diastolic (congestive) heart failure (HCC)   CKD (chronic kidney disease), stage III   Pressure ulcer   Discharge Condition: stable  Diet recommendation: cardiac  Filed Weights   02/18/16 1745 02/18/16 2146 02/20/16 0539  Weight: 90.719 kg (200 lb) 94.484 kg (208 lb 4.8 oz) 94.484 kg (208 lb 4.8 oz)    History of present illness:  Victor Green is a 82 y.o. male with medical history significant of persistent anemia and thrombocytopenia questionable for myelodysplastic syndrome per his oncologist, Dr. Julien Nordmann, HTN, hyperlipidemia, asthma, GERD, hypothyroidism, CAD, aortic insufficiency, GI bleeding secondary to pyloric-positive ulcer, dCHF, glaucoma, diverticulosis, chronic kidney disease-stage III, who presents with generalized weakness  in the past several days, which has been progressively getting worse. Patient was evaluated one week ago for similar concerns. He denies melena, hemoptysis and hematuria. No chest pain, dizziness, shortness of breath, nausea, vomiting, abdominal pain or diarrhea. No unilateral weakness. No symptoms of UTI.  ED Course: pt was found to have hemoglobin drop from 8.4 on 01/23/16--> 7.3 on 02/11/16-->6.2 today. Platelets 38 on 02/11/16-->34 today. WBC 28.9, temperature normal, no tachycardia, stable renal function, lactate 1.05, troponin negative, lipase 59, negative urinalysis. Chest x-ray showed elevated right hemidiaphragm with chronic right basilar atelectasis similar to  prior exams; Low lung volumes are present, causing crowding of the pulmonary vasculature; borderline enlargement of the cardiopericardial silhouette; and atherosclerotic aortic arch. Pt is Admitted to inpatient for further evaluation and treatment and observation.  Hospital Course:  Symptomatic anemia: Hgb 6.2 on admission. This is most likely due to worsening anemia from possible myelodysplastic syndrome. Pt has been followed by Dr. Julien Nordmann felt worsening of the myelodysplastic syndrome in addition to anemia of chronic disease per Dr. Julien Nordmann. Pt declined to proceed with the bone marrow biopsy and aspirate for confirmation of his diagnosis. Pt needed frequent blood transfusions recently. Dr. Earlie Server recommended to continue with supportive care and transfusion on as-needed basis based on his symptoms. Transfused 2 units of PRBC. Improved Hb 6.3-8.3 and remained stable at 8.9  Generalized weakness Pt's WBC worsened from 8K in 1 month back to 28K today. Concerned about pneumonia considering patient's recent history of mild cough Patient was started on levofloxacin with improvement of the WBC from 28,000-24,000. Patient has been afebrile. Feels improved from the time of the admission. Patient is discharged home with levofloxacin for a total of 7 days. Patient would need to follow-up with primary care physician as an outpatient.  Possible myelodysplastic syndrome: -f/u with Dr. Julien Nordmann.  HTN: Blood pressure was well controlled throughout the hospital stay Continue the Coreg and hydralazine. Was given 1 dose of torsemide and metolazone with good improvement of the shortness of breath.  HLD: Last LDL was 47 on 07/17/15 -Continue home medications: Lipitor  Hypothyroidism: Last TSH was 2.571 on 08/10/15 -Continue home Synthroid  Chronic diastolic (congestive) heart failure ([place]): 2-D echo 09/22/15 showed EF 50-60 percent with grade 1 diastolic dysfunction. His dry weight is about 205 LBs. Today his  weight is 207 LBs. Has edema over both elbow and arms, slightly  fluid overloaded. given metolazone 2.5 mg 1 and continued with  Coreg.  CKD (chronic kidney disease), stage III: stable. Baseline creatinine 1.3-1.8. His creatinine is 1.83 on admission. -Follow-up renal function by BMP as an outpatient  Severe debility Patient is bedbound/wheelchair-bound at baseline   Discharge Exam: Filed Vitals:   02/20/16 1030 02/20/16 1209  BP: 120/49 128/53  Pulse: 66 65  Temp: 98 F (36.7 C) 98 F (36.7 C)  Resp: 82 18    General:ell-built well-nourished age-appropriate male not in distress Cardiovascular: S1-S2 regular Respiratory: bilateral clear to auscultationmildly decreased breath sounds in bilateral lower lobes.  Discharge Instructions    Current Discharge Medication List    START taking these medications   Details  levofloxacin (LEVAQUIN) 500 MG tablet Take 1 tablet (500 mg total) by mouth daily. Qty: 7 tablet, Refills: 0      CONTINUE these medications which have CHANGED   Details  potassium chloride 20 MEQ/15ML (10%) SOLN Take 30 mLs (40 mEq total) by mouth 2 (two) times a week. Take on Monday and Friday with metolazone Qty: 450 mL, Refills: 0      CONTINUE these medications which have NOT CHANGED   Details  acetaminophen (TYLENOL) 325 MG tablet Take 975 mg by mouth every morning.     azelastine (ASTELIN) 0.1 % nasal spray Place 1 spray into both nostrils 2 (two) times daily. Use in each nostril as directed Qty: 30 mL, Refills: 3    brimonidine (ALPHAGAN) 0.15 % ophthalmic solution Place 1 drop into the left eye 2 (two) times daily.     carvedilol (COREG) 6.25 MG tablet Take 1 tablet (6.25 mg total) by mouth 2 (two) times daily with a meal. Qty: 60 tablet, Refills: 0    diazepam (VALIUM) 2 MG tablet Take 0.5 tablets (1 mg total) by mouth at bedtime. Qty: 90 tablet, Refills: 1    dorzolamide (TRUSOPT) 2 % ophthalmic solution Place 1 drop into the left eye 2 (two)  times daily.     ferrous sulfate 325 (65 FE) MG tablet Take 325 mg by mouth every evening.     fexofenadine (ALLEGRA) 180 MG tablet Take 90 mg by mouth every evening. @@ 1900    fluticasone (FLONASE) 50 MCG/ACT nasal spray Use 2 sprays in each  nostril twice daily Qty: 64 g, Refills: 1    glucosamine-chondroitin 500-400 MG tablet Take 1 tablet by mouth 2 (two) times daily.     hydrALAZINE (APRESOLINE) 25 MG tablet Take 1 tablet (25 mg total) by mouth 3 (three) times daily. Qty: 90 tablet, Refills: 3    levothyroxine (SYNTHROID, LEVOTHROID) 50 MCG tablet Take 1 tablet by mouth  daily Qty: 90 tablet, Refills: 1    metolazone (ZAROXOLYN) 2.5 MG tablet Take 1 tablet by mouth every Monday and Friday.  Hold if weight is 200lbs or less. Qty: 10 tablet, Refills: 3    Multiple Vitamin (MULTIVITAMIN WITH MINERALS) TABS tablet Take 1 tablet by mouth daily.    pantoprazole (PROTONIX) 40 MG tablet Take 1 tablet by mouth  daily Qty: 90 tablet, Refills: 1    Probiotic Product (ALIGN) 4 MG CAPS Take 4 mg by mouth every evening.    torsemide (DEMADEX) 20 MG tablet Take 40 mg by mouth daily.    atorvastatin (LIPITOR) 10 MG tablet Take 1 tablet by mouth  daily Qty: 90 tablet, Refills: 1    EPIPEN 2-PAK 0.3 MG/0.3ML SOAJ injection Inject 0.63m (0.322m into  the muscle once as directed Qty:  2 Device, Refills: 0       Allergies  Allergen Reactions  . Other Anaphylaxis    Horse serum  . Tetanus Toxoids Other (See Comments)   Follow-up Information    Follow up with HOLWERDA, SCOTT, MD. Schedule an appointment as soon as possible for a visit in 1 week.   Specialty:  Internal Medicine   Contact information:   [place] [address] [phone]        The results of significant diagnostics from this hospitalization (including imaging, microbiology, ancillary and laboratory) are listed below for reference.    Significant Diagnostic Studies: Dg Chest 2 View  02/18/2016   CLINICAL DATA:  Weakness.  Anemia. EXAM: CHEST  2 VIEW COMPARISON:  02/11/2016 FINDINGS: Elevated right hemidiaphragm with bandlike atelectasis along the right lung base. Generally low lung volumes. Atherosclerotic aortic arch. Borderline enlargement of the cardiopericardial silhouette. Degenerative right glenohumeral arthropathy IMPRESSION: 1. Elevated right hemidiaphragm with chronic right basilar atelectasis similar to prior exams. 2. Low lung volumes are present, causing crowding of the pulmonary vasculature. 3. Borderline enlargement of the cardiopericardial silhouette. 4. Atherosclerotic aortic arch. Electronically Signed   By: Van Clines M.D.   On: 02/18/2016 19:06   Dg Chest 2 View  02/11/2016  CLINICAL DATA:  Elevated white blood cell count. Hypertension. Coronary artery disease EXAM: CHEST  2 VIEW COMPARISON:  Report of 12/03/2015 ; images not available. FINDINGS: Patient rotated right on the frontal. Midline trachea. Cardiomegaly with a tortuous thoracic aorta. Transverse aortic atherosclerosis. Moderate right hemidiaphragm eventration. No pleural effusion or pneumothorax. Bibasilar volume loss. No congestive failure. No lobar consolidation. IMPRESSION: Cardiomegaly and right hemidiaphragm eventration. No acute findings. Electronically Signed   By: Abigail Miyamoto M.D.   On: 02/11/2016 14:51    Microbiology: Recent Results (from the past 240 hour(s))  Urine culture     Status: None   Collection Time: 02/11/16  7:55 PM  Result Value Ref Range Status   Specimen Description URINE, RANDOM  Final   Special Requests NONE  Final   Culture MULTIPLE SPECIES PRESENT, SUGGEST RECOLLECTION  Final   Report Status 02/13/2016 FINAL  Final     Labs: Basic Metabolic Panel:  Recent Labs Lab 02/18/16 1821 02/19/16 0731 02/20/16 0816  NA 142 144 143  K 3.9 3.3* 2.6*  CL 104 103 98*  CO2 28 28 27   GLUCOSE 101* 100* 92  BUN 39* 40* 45*  CREATININE 1.83* 1.73* 1.88*  CALCIUM 9.1 9.2 9.1    Liver Function Tests:  Recent Labs Lab 02/18/16 1821  AST 33  ALT 18  ALKPHOS 54  BILITOT 0.8  PROT 6.4*  ALBUMIN 3.2*    Recent Labs Lab 02/18/16 1821  LIPASE 59*   No results for input(s): AMMONIA in the last 168 hours. CBC:  Recent Labs Lab 02/18/16 1821 02/19/16 0731 02/20/16 0816  WBC 28.9* 28.2* 24.5*  HGB 6.2* 8.3* 8.9*  HCT 19.8* 25.2* 26.7*  MCV 101.5* 96.2 94.7  PLT 34* 31* 33*   Cardiac Enzymes:  Recent Labs Lab 02/18/16 1821  TROPONINI <0.03   BNP: BNP (last 3 results)  Recent Labs  11/04/15 1607 12/03/15 0130 02/18/16 1821  BNP 162.6* 240.6* 354.7*    ProBNP (last 3 results) No results for input(s): PROBNP in the last 8760 hours.  CBG: No results for input(s): GLUCAP in the last 168 hours.     SignedMonica Becton MD.  [facility] 02/20/2016, 1:08 PM

## 2016-02-28 ENCOUNTER — Other Ambulatory Visit: Payer: Self-pay | Admitting: Endocrinology

## 2016-03-03 ENCOUNTER — Observation Stay (HOSPITAL_COMMUNITY): Payer: Medicare Other

## 2016-03-03 ENCOUNTER — Observation Stay (HOSPITAL_COMMUNITY)
Admission: EM | Admit: 2016-03-03 | Discharge: 2016-03-04 | Disposition: A | Discharge status: Home / Self Care | Payer: Medicare Other | Attending: Internal Medicine | Admitting: Internal Medicine

## 2016-03-03 ENCOUNTER — Encounter (HOSPITAL_COMMUNITY): Payer: Self-pay | Admitting: Emergency Medicine

## 2016-03-03 DIAGNOSIS — N183 Chronic kidney disease, stage 3 unspecified: Secondary | ICD-10-CM | POA: Diagnosis present

## 2016-03-03 DIAGNOSIS — K922 Gastrointestinal hemorrhage, unspecified: Secondary | ICD-10-CM | POA: Diagnosis present

## 2016-03-03 DIAGNOSIS — Z7951 Long term (current) use of inhaled steroids: Secondary | ICD-10-CM | POA: Insufficient documentation

## 2016-03-03 DIAGNOSIS — I2510 Atherosclerotic heart disease of native coronary artery without angina pectoris: Secondary | ICD-10-CM | POA: Insufficient documentation

## 2016-03-03 DIAGNOSIS — C946 Myelodysplastic disease, not classified: Secondary | ICD-10-CM | POA: Diagnosis not present

## 2016-03-03 DIAGNOSIS — I509 Heart failure, unspecified: Secondary | ICD-10-CM | POA: Insufficient documentation

## 2016-03-03 DIAGNOSIS — J45909 Unspecified asthma, uncomplicated: Secondary | ICD-10-CM | POA: Diagnosis not present

## 2016-03-03 DIAGNOSIS — Z8719 Personal history of other diseases of the digestive system: Secondary | ICD-10-CM | POA: Diagnosis not present

## 2016-03-03 DIAGNOSIS — Z79899 Other long term (current) drug therapy: Secondary | ICD-10-CM | POA: Diagnosis not present

## 2016-03-03 DIAGNOSIS — Z872 Personal history of diseases of the skin and subcutaneous tissue: Secondary | ICD-10-CM | POA: Diagnosis not present

## 2016-03-03 DIAGNOSIS — Z8669 Personal history of other diseases of the nervous system and sense organs: Secondary | ICD-10-CM | POA: Insufficient documentation

## 2016-03-03 DIAGNOSIS — F0390 Unspecified dementia without behavioral disturbance: Secondary | ICD-10-CM | POA: Insufficient documentation

## 2016-03-03 DIAGNOSIS — I10 Essential (primary) hypertension: Secondary | ICD-10-CM | POA: Insufficient documentation

## 2016-03-03 DIAGNOSIS — R799 Abnormal finding of blood chemistry, unspecified: Secondary | ICD-10-CM | POA: Diagnosis present

## 2016-03-03 DIAGNOSIS — D649 Anemia, unspecified: Secondary | ICD-10-CM | POA: Diagnosis not present

## 2016-03-03 DIAGNOSIS — I5032 Chronic diastolic (congestive) heart failure: Secondary | ICD-10-CM | POA: Diagnosis present

## 2016-03-03 DIAGNOSIS — K254 Chronic or unspecified gastric ulcer with hemorrhage: Secondary | ICD-10-CM | POA: Diagnosis not present

## 2016-03-03 DIAGNOSIS — D72829 Elevated white blood cell count, unspecified: Secondary | ICD-10-CM

## 2016-03-03 DIAGNOSIS — Z792 Long term (current) use of antibiotics: Secondary | ICD-10-CM | POA: Diagnosis not present

## 2016-03-03 DIAGNOSIS — M170 Bilateral primary osteoarthritis of knee: Secondary | ICD-10-CM | POA: Insufficient documentation

## 2016-03-03 DIAGNOSIS — D469 Myelodysplastic syndrome, unspecified: Secondary | ICD-10-CM

## 2016-03-03 DIAGNOSIS — E039 Hypothyroidism, unspecified: Secondary | ICD-10-CM | POA: Diagnosis present

## 2016-03-03 LAB — PREPARE RBC (CROSSMATCH)

## 2016-03-03 LAB — COMPREHENSIVE METABOLIC PANEL
ALBUMIN: 3.3 g/dL — AB (ref 3.5–5.0)
ALT: 13 U/L — AB (ref 17–63)
ANION GAP: 9 (ref 5–15)
AST: 29 U/L (ref 15–41)
Alkaline Phosphatase: 50 U/L (ref 38–126)
BILIRUBIN TOTAL: 0.8 mg/dL (ref 0.3–1.2)
BUN: 56 mg/dL — ABNORMAL HIGH (ref 6–20)
CO2: 34 mmol/L — ABNORMAL HIGH (ref 22–32)
Calcium: 9 mg/dL (ref 8.9–10.3)
Chloride: 95 mmol/L — ABNORMAL LOW (ref 101–111)
Creatinine, Ser: 1.94 mg/dL — ABNORMAL HIGH (ref 0.61–1.24)
GFR calc Af Amer: 33 mL/min — ABNORMAL LOW (ref >60)
GFR, EST NON AFRICAN AMERICAN: 28 mL/min — AB (ref >60)
Glucose, Bld: 117 mg/dL — ABNORMAL HIGH (ref 65–99)
POTASSIUM: 3.7 mmol/L (ref 3.5–5.1)
SODIUM: 138 mmol/L (ref 135–145)
TOTAL PROTEIN: 6 g/dL — AB (ref 6.5–8.1)

## 2016-03-03 LAB — CBC
HEMATOCRIT: 20.3 % — AB (ref 39.0–52.0)
HEMOGLOBIN: 6.8 g/dL — AB (ref 13.0–17.0)
MCH: 32.2 pg (ref 26.0–34.0)
MCHC: 33.5 g/dL (ref 30.0–36.0)
MCV: 96.2 fL (ref 78.0–100.0)
Platelets: 23 10*3/uL — CL (ref 150–400)
RBC: 2.11 MIL/uL — ABNORMAL LOW (ref 4.22–5.81)
RDW: 19.6 % — ABNORMAL HIGH (ref 11.5–15.5)
WBC: 32.4 10*3/uL — AB (ref 4.0–10.5)

## 2016-03-03 LAB — POC OCCULT BLOOD, ED: Fecal Occult Bld: POSITIVE — AB

## 2016-03-03 MED ORDER — DORZOLAMIDE HCL 2 % OP SOLN
1.0000 [drp] | Freq: Two times a day (BID) | OPHTHALMIC | Status: DC
  Administered 2016-03-03 – 2016-03-04 (×2): 1 [drp] via OPHTHALMIC
  Filled 2016-03-03: qty 10

## 2016-03-03 MED ORDER — RISAQUAD PO CAPS
1.0000 | ORAL_CAPSULE | Freq: Every evening | ORAL | Status: DC
  Administered 2016-03-03: 1 via ORAL
  Filled 2016-03-03 (×2): qty 1

## 2016-03-03 MED ORDER — ONDANSETRON HCL 4 MG PO TABS: 4.0000 mg | ORAL_TABLET | Freq: Four times a day (QID) | ORAL | Status: DC | PRN

## 2016-03-03 MED ORDER — FLUTICASONE PROPIONATE 50 MCG/ACT NA SUSP
2.0000 | Freq: Every day | NASAL | Status: DC
  Administered 2016-03-04: 2 via NASAL
  Filled 2016-03-03: qty 16

## 2016-03-03 MED ORDER — FERROUS SULFATE 325 (65 FE) MG PO TABS: 325.0000 mg | ORAL_TABLET | Freq: Every evening | ORAL | Status: DC

## 2016-03-03 MED ORDER — ONDANSETRON HCL 4 MG/2ML IJ SOLN: 4.0000 mg | Freq: Four times a day (QID) | INTRAMUSCULAR | Status: DC | PRN

## 2016-03-03 MED ORDER — BRIMONIDINE TARTRATE 0.15 % OP SOLN
1.0000 [drp] | Freq: Two times a day (BID) | OPHTHALMIC | Status: DC
  Administered 2016-03-03 – 2016-03-04 (×2): 1 [drp] via OPHTHALMIC
  Filled 2016-03-03: qty 5

## 2016-03-03 MED ORDER — CARVEDILOL 6.25 MG PO TABS
6.2500 mg | ORAL_TABLET | Freq: Two times a day (BID) | ORAL | Status: DC
  Administered 2016-03-04: 6.25 mg via ORAL
  Filled 2016-03-03: qty 1

## 2016-03-03 MED ORDER — AZELASTINE HCL 0.1 % NA SOLN
1.0000 | Freq: Two times a day (BID) | NASAL | Status: DC
  Administered 2016-03-03 – 2016-03-04 (×2): 1 via NASAL
  Filled 2016-03-03: qty 30

## 2016-03-03 MED ORDER — LORATADINE 10 MG PO TABS
10.0000 mg | ORAL_TABLET | Freq: Every day | ORAL | Status: DC
  Administered 2016-03-04: 10 mg via ORAL
  Filled 2016-03-03: qty 1

## 2016-03-03 MED ORDER — SODIUM CHLORIDE 0.9% FLUSH
3.0000 mL | Freq: Two times a day (BID) | INTRAVENOUS | Status: DC
  Administered 2016-03-03 – 2016-03-04 (×2): 3 mL via INTRAVENOUS

## 2016-03-03 MED ORDER — POTASSIUM CHLORIDE 20 MEQ/15ML (10%) PO SOLN
40.0000 meq | Freq: Every day | ORAL | Status: DC
  Administered 2016-03-04: 40 meq via ORAL
  Filled 2016-03-03: qty 30

## 2016-03-03 MED ORDER — PANTOPRAZOLE SODIUM 40 MG IV SOLR
40.0000 mg | Freq: Two times a day (BID) | INTRAVENOUS | Status: DC
  Administered 2016-03-03 – 2016-03-04 (×2): 40 mg via INTRAVENOUS
  Filled 2016-03-03 (×2): qty 40

## 2016-03-03 MED ORDER — ACETAMINOPHEN 325 MG PO TABS
650.0000 mg | ORAL_TABLET | Freq: Four times a day (QID) | ORAL | Status: DC | PRN
  Administered 2016-03-04: 650 mg via ORAL
  Filled 2016-03-03: qty 2

## 2016-03-03 MED ORDER — HYDRALAZINE HCL 25 MG PO TABS
25.0000 mg | ORAL_TABLET | Freq: Three times a day (TID) | ORAL | Status: DC
  Administered 2016-03-03 – 2016-03-04 (×2): 25 mg via ORAL
  Filled 2016-03-03 (×2): qty 1

## 2016-03-03 MED ORDER — PANTOPRAZOLE SODIUM 40 MG IV SOLR
40.0000 mg | Freq: Once | INTRAVENOUS | Status: AC
  Administered 2016-03-03: 40 mg via INTRAVENOUS
  Filled 2016-03-03: qty 40

## 2016-03-03 MED ORDER — ADULT MULTIVITAMIN W/MINERALS CH
1.0000 | ORAL_TABLET | Freq: Every day | ORAL | Status: DC
  Administered 2016-03-04: 1 via ORAL
  Filled 2016-03-03: qty 1

## 2016-03-03 MED ORDER — SODIUM CHLORIDE 0.9 % IV SOLN: 10.0000 mL/h | Freq: Once | INTRAVENOUS | Status: DC

## 2016-03-03 MED ORDER — TORSEMIDE 20 MG PO TABS
40.0000 mg | ORAL_TABLET | Freq: Every day | ORAL | Status: DC
  Administered 2016-03-04: 40 mg via ORAL
  Filled 2016-03-03: qty 2

## 2016-03-03 MED ORDER — ACETAMINOPHEN 650 MG RE SUPP: 650.0000 mg | Freq: Four times a day (QID) | RECTAL | Status: DC | PRN

## 2016-03-03 MED ORDER — DIAZEPAM 2 MG PO TABS
1.0000 mg | ORAL_TABLET | Freq: Every day | ORAL | Status: DC
  Administered 2016-03-03: 1 mg via ORAL
  Filled 2016-03-03: qty 1

## 2016-03-03 MED ORDER — ATORVASTATIN CALCIUM 10 MG PO TABS: 10.0000 mg | ORAL_TABLET | Freq: Every day | ORAL | Status: DC

## 2016-03-03 MED ORDER — LEVOTHYROXINE SODIUM 50 MCG PO TABS
50.0000 ug | ORAL_TABLET | Freq: Every day | ORAL | Status: DC
  Administered 2016-03-04: 50 ug via ORAL
  Filled 2016-03-03: qty 1

## 2016-03-03 MED ORDER — SODIUM CHLORIDE 0.9 % IV BOLUS (SEPSIS)
500.0000 mL | Freq: Once | INTRAVENOUS | Status: AC
  Administered 2016-03-03: 500 mL via INTRAVENOUS

## 2016-03-03 NOTE — ED Notes (Signed)
Darl Householder, MD made aware of patient's critical values

## 2016-03-03 NOTE — ED Provider Notes (Signed)
CSN: [id number]     Arrival date & time 03/03/16  1802 History   First MD Initiated Contact with Patient 03/03/16 1814     Chief Complaint  Patient presents with  . Abnormal Lab  . Cancer     (Consider location/radiation/quality/duration/timing/severity/associated sxs/prior Treatment) The history is provided by the patient, the EMS personnel and a significant other.  Victor Green is a 82 y.o. male history of hypertension, CAD, GI bleed from previous pyloric ulcer, diverticulosis who presenting with symptomatic anemia. He has 24-hour aide at home. As per his aides, patient weaker for the last 2-3 days. He seemed more short of breath when he tries to do some activities. He also had to increase his oxygen to 2.5-3 L from 2 L at baseline. Denies any chest pain or short of breath. Denies any abdominal pain. He has been constipated with no bowel movement yesterday has been straining today. He also has been taking more Motrin for his knee pain. He had labs drawn this morning and Hg was 6.5. He was admitted several weeks ago for symptomatic anemia likely secondary to myelodysplastic syndrome. Of note, he has no black stools but does have history of pyloric ulcers. Patient is full code as per aides.   Level V caveat- dementia   Past Medical History  Diagnosis Date  . Hypertension   . CAD (coronary artery disease) complete w/u in 2009    extensive coronary calcification by EBCT, normal myoview, echo with EF 70% mild AI  . Hyperlipidemia   . Aortic insufficiency     echo July 2011 EF 60%  . 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 History of GI bleed 2003    due to pyloric-positive ulcer   . Diverticulosis     with remote history of diverticulitis  . Plantar fasciitis     chronic  . Allergic rhinitis     responding well to desensitation injections  . Glaucoma   . Osteoarthritis     both knees  . Asthma   . CHF (congestive heart failure) ([place])   . History of blood  transfusion "several"    "he's anemic"   Past Surgical History  Procedure Laterality Date  . Tonsillectomy    . Hemorrhoid surgery  1996  . Inguinal hernia repair Right 1996  . Lumbar laminectomy  2003  . Cataract extraction Left   . Back surgery     Family History  Problem Relation Age of Onset  . Thrombosis Father     mesenteric artery thrombosis after prostate surgery  . Colon cancer Mother    Social History  Substance Use Topics  . Smoking status: Never Smoker   . Smokeless tobacco: Never Used  . Alcohol Use: No    Review of Systems  Neurological: Positive for weakness.  All other systems reviewed and are negative.     Allergies  Other and Tetanus toxoids  Home Medications   Prior to Admission medications   Medication Sig Start Date End Date Taking? Authorizing Provider  acetaminophen (TYLENOL) 325 MG tablet Take 975 mg by mouth every morning.     Historical Provider, MD  atorvastatin (LIPITOR) 10 MG tablet Take 1 tablet by mouth  daily 07/09/15   [name], MD  azelastine (ASTELIN) 0.1 % nasal spray Place 1 spray into both nostrils 2 (two) times daily. Use in each nostril as directed 08/20/14   [name], MD  brimonidine (ALPHAGAN) 0.15 % ophthalmic  solution Place 1 drop into the left eye 2 (two) times daily.     Historical Provider, MD  carvedilol (COREG) 6.25 MG tablet Take 1 tablet (6.25 mg total) by mouth 2 (two) times daily with a meal. 09/22/15   Debbe Odea, MD  diazepam (VALIUM) 2 MG tablet Take 0.5 tablets (1 mg total) by mouth at bedtime. 10/11/15   [name], MD  dorzolamide (TRUSOPT) 2 % ophthalmic solution Place 1 drop into the left eye 2 (two) times daily.     Historical Provider, MD  EPIPEN 2-PAK 0.3 MG/0.3ML SOAJ injection Inject 0.46ml (0.3mg ) into  the muscle once as directed 12/26/14   [name], MD  ferrous sulfate 325 (65 FE) MG tablet Take 325 mg by mouth every evening.     Historical Provider, MD  fexofenadine (ALLEGRA) 180 MG tablet Take 90  mg by mouth every evening. @@ 1900    Historical Provider, MD  fluticasone (FLONASE) 50 MCG/ACT nasal spray Use 2 sprays in each  nostril twice daily 08/21/15   [name], MD  glucosamine-chondroitin 500-400 MG tablet Take 1 tablet by mouth 2 (two) times daily.     Historical Provider, MD  hydrALAZINE (APRESOLINE) 25 MG tablet Take 1 tablet (25 mg total) by mouth 3 (three) times daily. 12/09/15   Ripudeep Krystal Eaton, MD  levofloxacin (LEVAQUIN) 500 MG tablet Take 1 tablet (500 mg total) by mouth daily. 02/20/16   Monica Becton, MD  levothyroxine (SYNTHROID, LEVOTHROID) 50 MCG tablet Take 1 tablet by mouth  daily 07/01/15   [name], MD  metolazone (ZAROXOLYN) 2.5 MG tablet Take 1 tablet by mouth every Monday and Friday.  Hold if weight is 200lbs or less. 12/26/15   Jolaine Artist, MD  Multiple Vitamin (MULTIVITAMIN WITH MINERALS) TABS tablet Take 1 tablet by mouth daily.    Historical Provider, MD  pantoprazole (PROTONIX) 40 MG tablet Take 1 tablet by mouth  daily 09/25/15   [name], MD  potassium chloride 20 MEQ/15ML (10%) SOLN Take 30 mLs (40 mEq total) by mouth 2 (two) times a week. Take on Monday and Friday with metolazone 02/20/16   Monica Becton, MD  Probiotic Product (ALIGN) 4 MG CAPS Take 4 mg by mouth every evening.    Historical Provider, MD  torsemide (DEMADEX) 20 MG tablet Take 40 mg by mouth daily.    Historical Provider, MD   BP 122/59 mmHg  Pulse 64  Temp(Src) 97.8 F (36.6 C) (Oral)  Resp 20  SpO2 98% Physical Exam  Constitutional:  Chronically ill   HENT:  Head: Normocephalic.  Eyes: Conjunctivae are normal. Pupils are equal, round, and reactive to light.  Pale   Neck: Normal range of motion. Neck supple.  Cardiovascular: Normal rate, regular rhythm and normal heart sounds.   Pulmonary/Chest: Effort normal and breath sounds normal. No respiratory distress. He has no wheezes. He has no rales.  Abdominal: Soft. Bowel sounds are normal. He exhibits no distension.  There is no tenderness. There is no rebound.  Genitourinary:  Rectal- stool in vault, brown color, no obvious hemorrhoids   Musculoskeletal: Normal range of motion.  Neurological: He is alert.  Demented, moving all extremities   Skin: Skin is warm and dry.  Psychiatric:  Unable   Nursing note and vitals reviewed.   ED Course  Procedures (including critical care time)  CRITICAL CARE Performed by: Darl Householder, Jermie Hippe   Total critical care time: 30 minutes  Critical care time was exclusive of separately billable procedures  and treating other patients.  Critical care was necessary to treat or prevent imminent or life-threatening deterioration.  Critical care was time spent personally by me on the following activities: development of treatment plan with patient and/or surrogate as well as nursing, discussions with consultants, evaluation of patient's response to treatment, examination of patient, obtaining history from patient or surrogate, ordering and performing treatments and interventions, ordering and review of laboratory studies, ordering and review of radiographic studies, pulse oximetry and re-evaluation of patient's condition.   Labs Review Labs Reviewed  COMPREHENSIVE METABOLIC PANEL - Abnormal; Notable for the following:    Chloride 95 (*)    CO2 34 (*)    Glucose, Bld 117 (*)    BUN 56 (*)    Creatinine, Ser 1.94 (*)    Total Protein 6.0 (*)    Albumin 3.3 (*)    ALT 13 (*)    GFR calc non Af Amer 28 (*)    GFR calc Af Amer 33 (*)    All other components within normal limits  CBC - Abnormal; Notable for the following:    WBC 32.4 (*)    RBC 2.11 (*)    Hemoglobin 6.8 (*)    HCT 20.3 (*)    RDW 19.6 (*)    Platelets 23 (*)    All other components within normal limits  POC OCCULT BLOOD, ED - Abnormal; Notable for the following:    Fecal Occult Bld POSITIVE (*)    All other components within normal limits  TYPE AND SCREEN  PREPARE RBC (CROSSMATCH)    Imaging  Review No results found. I have personally reviewed and evaluated these images and lab results as part of my medical decision-making.   EKG Interpretation None      MDM   Final diagnoses:  None   Victor Green is a 82 y.o. male here with weakness, symptomatic anemia. Will get guiac. Will check labs and type and screen and likely need transfusion.   7 pm  Occ mildly positive. Has been taking motrin for leg pain and has hx of pyloric ulcers. I am unable to find any endoscopies in the system and aides don't know which GI doctor he has seen in the past. Will give protonix. Hg 6.8. Will order 2 U PRBC. Will admit.   7:12 PM I discussed with Dr. Henrene Pastor, GI on call. He states that given hx of myelodysplastic syndrome and patient's age, will just need to transfuse, stop motrin and unless patient has acute blood per rectum, won't need GI evaluation.     Wandra Arthurs, MD 03/03/16 [phone]

## 2016-03-03 NOTE — ED Notes (Signed)
Per EMS pt with hx of unknown cancer of unknown location from home here for low hemoglobin, needs blood transfusion.

## 2016-03-03 NOTE — ED Notes (Signed)
MD at bedside. 

## 2016-03-03 NOTE — ED Notes (Signed)
Bed: [id number] Expected date:  Expected time:  Means of arrival:  Comments: EMS abnormal lab/cancer pt 82 yo

## 2016-03-03 NOTE — H&P (Signed)
History and Physical    Victor Green [id number] DOB: [birth year] DOA: 03/03/2016  Referring MD/NP/PA: Cleophas Dunker. PCP: Velna Hatchet, MD  Outpatient Specialists: Dr.Mohammed. Patient coming from: Home. Has 24/7 home health care.  Chief Complaint: Weakness.  HPI: Victor Green is a 82 y.o. male with medical history significant of with history of possible myelodysplastic syndrome who has previously received blood transfusion for symptomatic anemia presents with weakness over the last few days. Patient also received extra dose of torsemide today by patient's healthcare aide after patient's weight was more than normal. In the ER patient's hemoglobin was found to be on 6 and still focal blood has been positive. Patient's home health aide has not noticed any obvious GI bleed. As per the ER physician there was no obvious melena on exam. ER physician did discuss with on-call gastroenterologist Dr. Henrene Pastor. Patient will be admitted for symptomatic anemia for blood transfusion and close follow-up of hemoglobin for possible GI bleed. As per the caregiver as patient has had previous history of peptic ulcer disease. Patient is not on any NSAIDs or blood thinners.  ED Course: Patient was ordered 2 units of packed red blood cell transfusion.  Review of Systems: As per HPI otherwise 10 point review of systems negative.    Past Medical History  Diagnosis Date  . Hypertension   . CAD (coronary artery disease) complete w/u in 2009    extensive coronary calcification by EBCT, normal myoview, echo with EF 70% mild AI  . Hyperlipidemia   . Aortic insufficiency     echo July 2011 EF 60%  . 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 History of GI bleed 2003    due to pyloric-positive ulcer   . Diverticulosis     with remote history of diverticulitis  . Plantar fasciitis     chronic  . Allergic rhinitis     responding well to desensitation injections  . Glaucoma   . Osteoarthritis    both knees  . Asthma   . CHF (congestive heart failure) ([place])   . History of blood transfusion "several"    "he's anemic"    Past Surgical History  Procedure Laterality Date  . Tonsillectomy    . Hemorrhoid surgery  1996  . Inguinal hernia repair Right 1996  . Lumbar laminectomy  2003  . Cataract extraction Left   . Back surgery       reports that he has never smoked. He has never used smokeless tobacco. He reports that he does not drink alcohol or use illicit drugs.  Allergies  Allergen Reactions  . Other Anaphylaxis    Horse serum  . Tetanus Toxoids Other (See Comments)    Family History  Problem Relation Age of Onset  . Thrombosis Father     mesenteric artery thrombosis after prostate surgery  . Colon cancer Mother     Prior to Admission medications   Medication Sig Start Date End Date Taking? Authorizing Provider  acetaminophen (TYLENOL) 325 MG tablet Take 975 mg by mouth every morning.    Yes Historical Provider, MD  atorvastatin (LIPITOR) 10 MG tablet Take 1 tablet by mouth  daily 07/09/15  Yes [name], MD  azelastine (ASTELIN) 0.1 % nasal spray Place 1 spray into both nostrils 2 (two) times daily. Use in each nostril as directed 08/20/14  Yes [name], MD  brimonidine (ALPHAGAN) 0.15 % ophthalmic solution Place 1 drop into the left eye 2 (two)  times daily.    Yes Historical Provider, MD  carvedilol (COREG) 6.25 MG tablet Take 1 tablet (6.25 mg total) by mouth 2 (two) times daily with a meal. 09/22/15  Yes Debbe Odea, MD  diazepam (VALIUM) 2 MG tablet Take 0.5 tablets (1 mg total) by mouth at bedtime. 10/11/15  Yes [name], MD  dorzolamide (TRUSOPT) 2 % ophthalmic solution Place 1 drop into the left eye 2 (two) times daily.    Yes Historical Provider, MD  EPIPEN 2-PAK 0.3 MG/0.3ML SOAJ injection Inject 0.4ml (0.3mg ) into  the muscle once as directed 12/26/14  Yes [name], MD  ferrous sulfate 325 (65 FE) MG tablet Take 325 mg by mouth every evening.    Yes  Historical Provider, MD  fexofenadine (ALLEGRA) 180 MG tablet Take 90 mg by mouth every evening. @@ 1900   Yes Historical Provider, MD  fluticasone (FLONASE) 50 MCG/ACT nasal spray Use 2 sprays in each  nostril twice daily 08/21/15  Yes [name], MD  glucosamine-chondroitin 500-400 MG tablet Take 1 tablet by mouth 2 (two) times daily.    Yes Historical Provider, MD  hydrALAZINE (APRESOLINE) 25 MG tablet Take 1 tablet (25 mg total) by mouth 3 (three) times daily. 12/09/15  Yes Ripudeep Krystal Eaton, MD  levothyroxine (SYNTHROID, LEVOTHROID) 50 MCG tablet Take 1 tablet by mouth  daily 07/01/15  Yes [name], MD  metolazone (ZAROXOLYN) 2.5 MG tablet Take 1 tablet by mouth every Monday and Friday.  Hold if weight is 200lbs or less. 12/26/15  Yes Jolaine Artist, MD  Multiple Vitamin (MULTIVITAMIN WITH MINERALS) TABS tablet Take 1 tablet by mouth daily.   Yes Historical Provider, MD  pantoprazole (PROTONIX) 40 MG tablet Take 1 tablet by mouth  daily 09/25/15  Yes [name], MD  potassium chloride 20 MEQ/15ML (10%) SOLN Take 30 mLs (40 mEq total) by mouth 2 (two) times a week. Take on Monday and Friday with metolazone Patient taking differently: Take 40 mEq by mouth daily. If pt weighs over 200 lbs give pt 80 meq daily with the double dose of either metolazone or torsemide, depending on the day 02/20/16  Yes Padmaja Vasireddy, MD  Probiotic Product (ALIGN) 4 MG CAPS Take 4 mg by mouth every evening.   Yes Historical Provider, MD  torsemide (DEMADEX) 20 MG tablet Take 40 mg by mouth daily. If pt weighs over 200lbs give an additional 40mg s to the regular 40mg  daily dose on all days other than Monday and Friday.   Yes Historical Provider, MD  levofloxacin (LEVAQUIN) 500 MG tablet Take 1 tablet (500 mg total) by mouth daily. Patient not taking: Reported on 03/03/2016 02/20/16   Monica Becton, MD    Physical Exam: Filed Vitals:   03/03/16 1830 03/03/16 1900 03/03/16 1930 03/03/16 2058  BP: 129/55 118/52 114/53  138/59  Pulse: 61 61 61 64  Temp:    97.9 F (36.6 C)  TempSrc:    Oral  Resp: 20 20 18 20   Height:    5\' 11"  (1.803 m)  Weight:    201 lb 8 oz (91.4 kg)  SpO2: 99% 99% 98% 100%      Constitutional: Appears normal. Filed Vitals:   03/03/16 1830 03/03/16 1900 03/03/16 1930 03/03/16 2058  BP: 129/55 118/52 114/53 138/59  Pulse: 61 61 61 64  Temp:    97.9 F (36.6 C)  TempSrc:    Oral  Resp: 20 20 18 20   Height:    5\' 11"  (1.803 m)  Weight:    201 lb 8 oz (91.4 kg)  SpO2: 99% 99% 98% 100%   Eyes: Anicteric mild pallor. ENMT: No discharge from the ears eyes nose or mouth. Neck: No mass felt. No neck rigidity. Respiratory: Bilateral air entry present no rhonchi or crepitations. Cardiovascular: S1 and S2 heard. Abdomen: Soft nontender bowel sounds present. Musculoskeletal: Patient is wearing stockings. Skin: No obvious rash. Neurologic: Alert awake oriented to time place and person. Moves all extremities. Psychiatric: Appears normal.   Labs on Admission: I have personally reviewed following labs and imaging studies  CBC:  Recent Labs Lab 03/03/16 1817  WBC 32.4*  HGB 6.8*  HCT 20.3*  MCV 96.2  PLT 23*   Basic Metabolic Panel:  Recent Labs Lab 03/03/16 1817  NA 138  K 3.7  CL 95*  CO2 34*  GLUCOSE 117*  BUN 56*  CREATININE 1.94*  CALCIUM 9.0   GFR: Estimated Creatinine Clearance: 27.5 mL/min (by C-G formula based on Cr of 1.94). Liver Function Tests:  Recent Labs Lab 03/03/16 1817  AST 29  ALT 13*  ALKPHOS 50  BILITOT 0.8  PROT 6.0*  ALBUMIN 3.3*   No results for input(s): LIPASE, AMYLASE in the last 168 hours. No results for input(s): AMMONIA in the last 168 hours. Coagulation Profile: No results for input(s): INR, PROTIME in the last 168 hours. Cardiac Enzymes: No results for input(s): CKTOTAL, CKMB, CKMBINDEX, TROPONINI in the last 168 hours. BNP (last 3 results) No results for input(s): PROBNP in the last 8760 hours. HbA1C: No  results for input(s): HGBA1C in the last 72 hours. CBG: No results for input(s): GLUCAP in the last 168 hours. Lipid Profile: No results for input(s): CHOL, HDL, LDLCALC, TRIG, CHOLHDL, LDLDIRECT in the last 72 hours. Thyroid Function Tests: No results for input(s): TSH, T4TOTAL, FREET4, T3FREE, THYROIDAB in the last 72 hours. Anemia Panel: No results for input(s): VITAMINB12, FOLATE, FERRITIN, TIBC, IRON, RETICCTPCT in the last 72 hours. Urine analysis:    Component Value Date/Time   COLORURINE YELLOW 02/18/2016 1941   APPEARANCEUR CLEAR 02/18/2016 1941   LABSPEC 1.016 02/18/2016 1941   PHURINE 7.5 02/18/2016 1941   GLUCOSEU NEGATIVE 02/18/2016 1941   GLUCOSEU NEGATIVE 07/19/2013 1353   HGBUR NEGATIVE 02/18/2016 1941   BILIRUBINUR NEGATIVE 02/18/2016 1941   KETONESUR NEGATIVE 02/18/2016 1941   PROTEINUR 30* 02/18/2016 1941   UROBILINOGEN 0.2 09/15/2015 1408   NITRITE NEGATIVE 02/18/2016 1941   LEUKOCYTESUR NEGATIVE 02/18/2016 1941   Sepsis Labs: @LABRCNTIP (procalcitonin:4,lacticidven:4) )No results found for this or any previous visit (from the past 240 hour(s)).   Radiological Exams on Admission: No results found.   Assessment/Plan Principal Problem:   Symptomatic anemia Active Problems:   Hypothyroidism   Chronic diastolic heart failure (HCC)   CKD (chronic kidney disease), stage III   GI bleed   Myelodysplasia (myelodysplastic syndrome) (HCC)   Anemia    #1. Symptomatic anemia - probably a combination of MDS and GI bleed. At this time 2 units of packed red blood cell transfusion has been ordered and repeat CBC in a.m. #2. GI bleed - ER physician did discuss with Dr. Henrene Pastor who at this time advised observation and to see if patient's hemoglobin improves with transfusion. For now I have placed patient on Protonix IV and full liquid diet. If hemoglobin improves with transfusion then may do further workup as outpatient. With close follow-up of CBC which I did discuss  with patient's home health aide. #3. Diastolic CHF with last EF measured  was 55-60% with grade 1 diastolic dysfunction in November 2016 - patient is on torsemide and on metolazone on Monday and Fridays. I have ordered 1 dose of Lasix after the first unit of transfusion. Closely follow his pituitary status intake output. #4. Hypertension on hydralazine and Coreg which will be continued. #5. Hypothyroidism on Synthroid. #6. Hyperlipidemia on statin. #7. Chronic kidney disease stage II to 3 - closely follow metabolic panel.   DVT prophylaxis: SCDs. Code Status: Full code.  Family Communication: No family at the bedside.  Disposition Plan: Home.  Consults called: Dr. Henrene Pastor gastroenterologist was consulted by ER physician. See H&P. Admission status: Observation to med surge floor. 23 hours.    Rise Patience MD [facility] Pager [phone].  If 7PM-7AM, please contact night-coverage [email or web address] Password [facility]  03/03/2016, 9:49 PM

## 2016-03-04 DIAGNOSIS — I5032 Chronic diastolic (congestive) heart failure: Secondary | ICD-10-CM

## 2016-03-04 DIAGNOSIS — D649 Anemia, unspecified: Secondary | ICD-10-CM | POA: Diagnosis not present

## 2016-03-04 DIAGNOSIS — R627 Adult failure to thrive: Secondary | ICD-10-CM

## 2016-03-04 DIAGNOSIS — E038 Other specified hypothyroidism: Secondary | ICD-10-CM | POA: Diagnosis not present

## 2016-03-04 DIAGNOSIS — D469 Myelodysplastic syndrome, unspecified: Secondary | ICD-10-CM

## 2016-03-04 DIAGNOSIS — N183 Chronic kidney disease, stage 3 (moderate): Secondary | ICD-10-CM

## 2016-03-04 LAB — URINALYSIS, ROUTINE W REFLEX MICROSCOPIC
BILIRUBIN URINE: NEGATIVE
GLUCOSE, UA: NEGATIVE mg/dL
HGB URINE DIPSTICK: NEGATIVE
Ketones, ur: NEGATIVE mg/dL
Leukocytes, UA: NEGATIVE
Nitrite: NEGATIVE
Protein, ur: 30 mg/dL — AB
SPECIFIC GRAVITY, URINE: 1.016 (ref 1.005–1.030)
pH: 7 (ref 5.0–8.0)

## 2016-03-04 LAB — BASIC METABOLIC PANEL
Anion gap: 13 (ref 5–15)
BUN: 56 mg/dL — AB (ref 6–20)
CHLORIDE: 98 mmol/L — AB (ref 101–111)
CO2: 32 mmol/L (ref 22–32)
Calcium: 9 mg/dL (ref 8.9–10.3)
Creatinine, Ser: 1.9 mg/dL — ABNORMAL HIGH (ref 0.61–1.24)
GFR calc Af Amer: 33 mL/min — ABNORMAL LOW (ref >60)
GFR calc non Af Amer: 29 mL/min — ABNORMAL LOW (ref >60)
GLUCOSE: 100 mg/dL — AB (ref 65–99)
POTASSIUM: 3.4 mmol/L — AB (ref 3.5–5.1)
Sodium: 143 mmol/L (ref 135–145)

## 2016-03-04 LAB — URINE MICROSCOPIC-ADD ON

## 2016-03-04 LAB — CBC
HEMATOCRIT: 26 % — AB (ref 39.0–52.0)
Hemoglobin: 8.7 g/dL — ABNORMAL LOW (ref 13.0–17.0)
MCH: 31.5 pg (ref 26.0–34.0)
MCHC: 33.5 g/dL (ref 30.0–36.0)
MCV: 94.2 fL (ref 78.0–100.0)
Platelets: 23 10*3/uL — CL (ref 150–400)
RBC: 2.76 MIL/uL — ABNORMAL LOW (ref 4.22–5.81)
RDW: 18.3 % — AB (ref 11.5–15.5)
WBC: 30.3 10*3/uL — AB (ref 4.0–10.5)

## 2016-03-04 MED ORDER — SENNOSIDES-DOCUSATE SODIUM 8.6-50 MG PO TABS: 1.0000 | ORAL_TABLET | Freq: Every day | ORAL | Status: AC

## 2016-03-04 NOTE — Progress Notes (Signed)
Patient discharged, left with son and caregiver.  Given discharge instructions and heart failure kit.   Victor Green

## 2016-03-04 NOTE — Discharge Summary (Signed)
Discharge Summary  [name]:[id number] DOB: 09-07-1920  PCP: Velna Hatchet, MD  Admit date: 03/03/2016 Discharge date: 03/04/2016  Time spent: >46mns  Recommendations for Outpatient Follow-up:  1. F/u with PMD within three days for hospital discharge follow up, repeat cbc/bmp at follow up, pmd to arrange outpatient blood product transfusion prn 2. Consider FIT (fecal immunochemical test) to r/o blood in the stool ,as patient has been on iron supplement, regular FOBT may not be reliable. Per personal care giver, patient's stool is brown. But patient did have bleeding ulcer about 20years ago per family.   Discharge Diagnoses:  Active Hospital Problems   Diagnosis Date Noted  . Symptomatic anemia 01/09/2016  . GI bleed 03/03/2016  . Myelodysplasia ([place]plastic syndrome) ([place] 03/03/2016  . Anemia 03/03/2016  . CKD (chronic kidney disease), stage III 01/09/2016  . Chronic diastolic heart failure ([place] 12/26/2015  . Hypothyroidism 12/21/2013    Resolved Hospital Problems   Diagnosis Date Noted Date Resolved  No resolved problems to display.    Discharge Condition: stable  Diet recommendation: heart healthy  Filed Weights   03/03/16 2058  Weight: 91.4 kg (201 lb 8 oz)    History of present illness:  Victor NAKAMAis a 82y.o. male with medical history significant of with history of possible myelodysplastic syndrome who has previously received blood transfusion for symptomatic anemia presents with weakness over the last few days. Patient also received extra dose of torsemide today by patient's healthcare aide after patient's weight was more than normal. In the ER patient's hemoglobin was found to be on 6 and still focal blood has been positive. Patient's home health aide has not noticed any obvious GI bleed. As per the ER physician there was no obvious melena on exam. ER physician did discuss with on-call gastroenterologist Dr. PHenrene Pastor Patient will be admitted for  symptomatic anemia for blood transfusion and close follow-up of hemoglobin for possible GI bleed. As per the caregiver as patient has had previous history of peptic ulcer disease. Patient is not on any NSAIDs or blood thinners.  ED Course: Patient was ordered 2 units of packed red blood cell transfusion.  Hospital Course:  Principal Problem:   Symptomatic anemia Active Problems:   Hypothyroidism   Chronic diastolic heart failure (HCC)   CKD (chronic kidney disease), stage III   GI bleed   Myelodysplasia (myelodysplastic syndrome) (HCC)   Anemia  #1. Symptomatic anemia - likely from  MDS, no sure if patient has GI bleed. See discussion below, hgb improved appropriately s/p 2 units of packed red blood. #2. GI bleed? - Per personal care giver, patient's stool is brown. But patient did have bleeding ulcer about 20years ago per family. Currently patient denies ab pain, no n/v, Consider outpatient FIT (fecal immunochemical test) to r/o blood in the stool ,as patient has been on iron supplement, regular FOBT may not be reliable. ER physician did discuss with [facility] Dr. PHenrene Pastorwho at this time advised observation and to see if patient's hemoglobin improves with transfusion. If hemoglobin improves with transfusion then may do further workup as outpatient.  #3. Diastolic CHF with last EF measured was 55-60% with grade 1 diastolic dysfunction in November 2016 - patient is on torsemide and on metolazone on Monday and Fridays. Patient received one dose of iv Lasix after the first unit of transfusion. euvolemic at discharge, continue home diuretics. #4. Hypertension on hydralazine and Coreg which will be continued. #5. Hypothyroidism on Synthroid. #6. Hyperlipidemia on statin. #  7. Chronic kidney disease stage II to 3 - stable. #8 FTT; patient has been bed to chair bound, require total care at home , has recent frequent hospitalization, now needing frequent blood transfusion due to possible MDS, patient has  declined bone marrow biopsy. Patient 's son Victor Green who is his hPOA is in the process of discussing code status and considering hospice in the future should patient decline further, currently Victor Green prefer to medical management and in the process working with patient/s pmd and hematologist to set up outpatient blood product transfusion, patient and family wish to bring patient home today.    Code Status: Full code.  Family Communication: patient son Victor Green and personal care giver at bedside Disposition Plan: Home.  Consults called: Dr. Henrene Pastor gastroenterologist was consulted by ER physician. See H&P.  Procedures:  prbc transfusionx2 on 5/3   Discharge Exam: BP 135/63 mmHg  Pulse 58  Temp(Src) 98.3 F (36.8 C) (Oral)  Resp 18  Ht 5' 11"  (1.803 m)  Wt 91.4 kg (201 lb 8 oz)  BMI 28.12 kg/m2  SpO2 98%  General: aaox3, frail, chronically ill appearing, NAD Cardiovascular: RRR Respiratory: CTABL Ab: NT/ND, positive bowel sounds Extremity: no edema  Discharge Instructions You were cared for by a hospitalist during your hospital stay. If you have any questions about your discharge medications or the care you received while you were in the hospital after you are discharged, you can call the unit and asked to speak with the hospitalist on call if the hospitalist that took care of you is not available. Once you are discharged, your primary care physician will handle any further medical issues. Please note that NO REFILLS for any discharge medications will be authorized once you are discharged, as it is imperative that you return to your primary care physician (or establish a relationship with a primary care physician if you do not have one) for your aftercare needs so that they can reassess your need for medications and monitor your lab values.      Discharge Instructions    Diet - low sodium heart healthy    Complete by:  As directed      Increase activity slowly    Complete by:  As directed              Medication List    STOP taking these medications        levofloxacin 500 MG tablet  Commonly known as:  LEVAQUIN      TAKE these medications        acetaminophen 325 MG tablet  Commonly known as:  TYLENOL  Take 975 mg by mouth every morning.     ALIGN 4 MG Caps  Take 4 mg by mouth every evening.     atorvastatin 10 MG tablet  Commonly known as:  LIPITOR  Take 1 tablet by mouth  daily     azelastine 0.1 % nasal spray  Commonly known as:  ASTELIN  Place 1 spray into both nostrils 2 (two) times daily. Use in each nostril as directed     brimonidine 0.15 % ophthalmic solution  Commonly known as:  ALPHAGAN  Place 1 drop into the left eye 2 (two) times daily.     carvedilol 6.25 MG tablet  Commonly known as:  COREG  Take 1 tablet (6.25 mg total) by mouth 2 (two) times daily with a meal.     diazepam 2 MG tablet  Commonly known as:  VALIUM  Take 0.5  tablets (1 mg total) by mouth at bedtime.     dorzolamide 2 % ophthalmic solution  Commonly known as:  TRUSOPT  Place 1 drop into the left eye 2 (two) times daily.     EPIPEN 2-PAK 0.3 mg/0.3 mL Soaj injection  Generic drug:  EPINEPHrine  Inject 0.61m (0.329m into  the muscle once as directed     ferrous sulfate 325 (65 FE) MG tablet  Take 325 mg by mouth every evening.     fexofenadine 180 MG tablet  Commonly known as:  ALLEGRA  Take 90 mg by mouth every evening. @@ 1900     fluticasone 50 MCG/ACT nasal spray  Commonly known as:  FLONASE  Use 2 sprays in each  nostril twice daily     glucosamine-chondroitin 500-400 MG tablet  Take 1 tablet by mouth 2 (two) times daily.     hydrALAZINE 25 MG tablet  Commonly known as:  APRESOLINE  Take 1 tablet (25 mg total) by mouth 3 (three) times daily.     levothyroxine 50 MCG tablet  Commonly known as:  SYNTHROID, LEVOTHROID  Take 1 tablet by mouth  daily     metolazone 2.5 MG tablet  Commonly known as:  ZAROXOLYN  Take 1 tablet by mouth every Monday and  Friday.  Hold if weight is 200lbs or less.     multivitamin with minerals Tabs tablet  Take 1 tablet by mouth daily.     pantoprazole 40 MG tablet  Commonly known as:  PROTONIX  Take 1 tablet by mouth  daily     potassium chloride 20 MEQ/15ML (10%) Soln  Take 30 mLs (40 mEq total) by mouth 2 (two) times a week. Take on Monday and Friday with metolazone     senna-docusate 8.6-50 MG tablet  Commonly known as:  Senokot-S  Take 1 tablet by mouth at bedtime.     torsemide 20 MG tablet  Commonly known as:  DEMADEX  Take 40 mg by mouth daily. If pt weighs over 200lbs give an additional 4033mto the regular 74m52mily dose on all days other than Monday and Friday.       Allergies  Allergen Reactions  . Other Anaphylaxis    Horse serum  . Tetanus Toxoids Other (See Comments)   Follow-up Information    Follow up with HOLWVelna Hatchet In 3 days.   Specialty:  Internal Medicine   Why:  hospital discharge follow up, pcp to arrange outpatient blood transfusion as needed   Contact information:   [phone]   Follow up with JohnScarlette Shorts.   Specialty:  Gastroenterology   Why:  as needed for ? GI bleed   Contact information:   [phone]    The results of significant diagnostics from this hospitalization (including imaging, microbiology, ancillary and laboratory) are listed below for reference.    Significant Diagnostic Studies: Dg Chest 2 View  02/18/2016  CLINICAL DATA:  Weakness.  Anemia. EXAM: CHEST  2 VIEW COMPARISON:  02/11/2016 FINDINGS: Elevated right hemidiaphragm with bandlike atelectasis along the right lung base. Generally low lung volumes. Atherosclerotic aortic arch. Borderline enlargement of the cardiopericardial silhouette. Degenerative right glenohumeral arthropathy IMPRESSION: 1. Elevated right hemidiaphragm with chronic right basilar atelectasis similar to prior exams.  2. Low lung volumes are present, causing crowding of the pulmonary vasculature. 3. Borderline enlargement of the cardiopericardial silhouette.  4. Atherosclerotic aortic arch. Electronically Signed   By: Van Clines M.D.   On: 02/18/2016 19:06   Dg Chest 2 View  02/11/2016  CLINICAL DATA:  Elevated white blood cell count. Hypertension. Coronary artery disease EXAM: CHEST  2 VIEW COMPARISON:  Report of 12/03/2015 ; images not available. FINDINGS: Patient rotated right on the frontal. Midline trachea. Cardiomegaly with a tortuous thoracic aorta. Transverse aortic atherosclerosis. Moderate right hemidiaphragm eventration. No pleural effusion or pneumothorax. Bibasilar volume loss. No congestive failure. No lobar consolidation. IMPRESSION: Cardiomegaly and right hemidiaphragm eventration. No acute findings. Electronically Signed   By: Abigail Miyamoto M.D.   On: 02/11/2016 14:51   Dg Chest Port 1 View  03/04/2016  CLINICAL DATA:  Leukocytosis EXAM: PORTABLE CHEST 1 VIEW COMPARISON:  02/18/2016 FINDINGS: Mild cardiac enlargement without pulmonary vascular congestion. Elevation of right hemidiaphragm with scarring or atelectasis in the right lung base, similar to prior study. No developing consolidation or airspace disease. No blunting of costophrenic angles. No pneumothorax. Calcified and tortuous aorta peer IMPRESSION: Stable finding of elevated right hemidiaphragm and linear scarring or atelectasis in the right lung base. Mild cardiac enlargement. Electronically Signed   By: Lucienne Capers M.D.   On: 03/04/2016 01:33    Microbiology: No results found for this or any previous visit (from the past 240 hour(s)).   Labs: Basic Metabolic Panel:  Recent Labs Lab 03/03/16 1817 03/04/16 0806  NA 138 143  K 3.7 3.4*  CL 95* 98*  CO2 34* 32  GLUCOSE 117* 100*  BUN 56* 56*  CREATININE 1.94* 1.90*  CALCIUM 9.0 9.0   Liver Function Tests:  Recent Labs Lab 03/03/16 1817  AST 29  ALT 13*  ALKPHOS  50  BILITOT 0.8  PROT 6.0*  ALBUMIN 3.3*   No results for input(s): LIPASE, AMYLASE in the last 168 hours. No results for input(s): AMMONIA in the last 168 hours. CBC:  Recent Labs Lab 03/03/16 1817 03/04/16 0806  WBC 32.4* 30.3*  HGB 6.8* 8.7*  HCT 20.3* 26.0*  MCV 96.2 94.2  PLT 23* 23*   Cardiac Enzymes: No results for input(s): CKTOTAL, CKMB, CKMBINDEX, TROPONINI in the last 168 hours. BNP: BNP (last 3 results)  Recent Labs  11/04/15 1607 12/03/15 0130 02/18/16 1821  BNP 162.6* 240.6* 354.7*    ProBNP (last 3 results) No results for input(s): PROBNP in the last 8760 hours.  CBG: No results for input(s): GLUCAP in the last 168 hours.     SignedFlorencia Reasons MD, PhD  [facility] 03/04/2016, 11:16 AM

## 2016-03-05 LAB — TYPE AND SCREEN
ABO/RH(D): O POS
Antibody Screen: NEGATIVE
UNIT DIVISION: 0
Unit division: 0

## 2016-03-12 ENCOUNTER — Telehealth (HOSPITAL_COMMUNITY): Payer: Self-pay | Admitting: *Deleted

## 2016-03-12 NOTE — Telephone Encounter (Signed)
Noreene Larsson called concerned about pt's wt.  She states it should be around 197 but lately it stays more around 200-205 and he is at 207 today.  She states she can tell it is fluid because he has swelling in neck, hands and abd when his wt is up.  She states he is taking Torsemide 40 mg in AM and metolazone 2.5 mg on Mon and Fri.  She states since pt was here last he has been diagnosed w/a blood disorder and has to get a transfusion (2 units) about every 2 weeks.  She is just concerned wt will continue to increase, she states she has given him 40 mg of Torsemide in the PM a few times.  Will discuss w/Dr Bensimhon and call her back.

## 2016-03-12 NOTE — Telephone Encounter (Signed)
Per Dr Haroldine Laws, ok to use Metolazone prn during the week for wt gain, spoke w/Jeanie she is aware

## 2016-03-20 ENCOUNTER — Emergency Department (HOSPITAL_COMMUNITY): Payer: Medicare Other

## 2016-03-20 ENCOUNTER — Encounter (HOSPITAL_COMMUNITY): Payer: Self-pay | Admitting: Internal Medicine

## 2016-03-20 ENCOUNTER — Observation Stay (HOSPITAL_COMMUNITY)
Admission: EM | Admit: 2016-03-20 | Discharge: 2016-03-21 | Disposition: A | Discharge status: Home / Self Care | Payer: Medicare Other | Attending: Internal Medicine | Admitting: Internal Medicine

## 2016-03-20 DIAGNOSIS — I509 Heart failure, unspecified: Secondary | ICD-10-CM | POA: Insufficient documentation

## 2016-03-20 DIAGNOSIS — N183 Chronic kidney disease, stage 3 unspecified: Secondary | ICD-10-CM

## 2016-03-20 DIAGNOSIS — D649 Anemia, unspecified: Secondary | ICD-10-CM

## 2016-03-20 DIAGNOSIS — E7800 Pure hypercholesterolemia, unspecified: Secondary | ICD-10-CM | POA: Diagnosis present

## 2016-03-20 DIAGNOSIS — Z79899 Other long term (current) drug therapy: Secondary | ICD-10-CM | POA: Diagnosis not present

## 2016-03-20 DIAGNOSIS — I130 Hypertensive heart and chronic kidney disease with heart failure and stage 1 through stage 4 chronic kidney disease, or unspecified chronic kidney disease: Secondary | ICD-10-CM | POA: Insufficient documentation

## 2016-03-20 DIAGNOSIS — E039 Hypothyroidism, unspecified: Secondary | ICD-10-CM | POA: Diagnosis present

## 2016-03-20 DIAGNOSIS — J45909 Unspecified asthma, uncomplicated: Secondary | ICD-10-CM | POA: Insufficient documentation

## 2016-03-20 DIAGNOSIS — D469 Myelodysplastic syndrome, unspecified: Principal | ICD-10-CM | POA: Insufficient documentation

## 2016-03-20 DIAGNOSIS — I5032 Chronic diastolic (congestive) heart failure: Secondary | ICD-10-CM | POA: Diagnosis present

## 2016-03-20 DIAGNOSIS — I2510 Atherosclerotic heart disease of native coronary artery without angina pectoris: Secondary | ICD-10-CM | POA: Insufficient documentation

## 2016-03-20 DIAGNOSIS — D72829 Elevated white blood cell count, unspecified: Secondary | ICD-10-CM | POA: Diagnosis present

## 2016-03-20 DIAGNOSIS — I10 Essential (primary) hypertension: Secondary | ICD-10-CM | POA: Diagnosis present

## 2016-03-20 DIAGNOSIS — R799 Abnormal finding of blood chemistry, unspecified: Secondary | ICD-10-CM | POA: Diagnosis present

## 2016-03-20 DIAGNOSIS — E785 Hyperlipidemia, unspecified: Secondary | ICD-10-CM | POA: Diagnosis not present

## 2016-03-20 LAB — CBC WITH DIFFERENTIAL/PLATELET
BASOS ABS: 0.3 10*3/uL — AB (ref 0.0–0.1)
Band Neutrophils: 8 %
Basophils Relative: 1 %
EOS ABS: 0.3 10*3/uL (ref 0.0–0.7)
Eosinophils Relative: 1 %
HCT: 22.8 % — ABNORMAL LOW (ref 39.0–52.0)
Hemoglobin: 7.4 g/dL — ABNORMAL LOW (ref 13.0–17.0)
LYMPHS ABS: 4.7 10*3/uL — AB (ref 0.7–4.0)
LYMPHS PCT: 14 %
MCH: 31.5 pg (ref 26.0–34.0)
MCHC: 32.5 g/dL (ref 30.0–36.0)
MCV: 97 fL (ref 78.0–100.0)
MONO ABS: 3.4 10*3/uL — AB (ref 0.1–1.0)
MONOS PCT: 10 %
NEUTROS PCT: 16 %
Neutro Abs: 8.1 10*3/uL — ABNORMAL HIGH (ref 1.7–7.7)
Other: 50 %
Platelets: 30 10*3/uL — ABNORMAL LOW (ref 150–400)
RBC: 2.35 MIL/uL — AB (ref 4.22–5.81)
RDW: 19.6 % — AB (ref 11.5–15.5)
WBC: 33.9 10*3/uL — AB (ref 4.0–10.5)

## 2016-03-20 LAB — BASIC METABOLIC PANEL
ANION GAP: 10 (ref 5–15)
BUN: 50 mg/dL — ABNORMAL HIGH (ref 6–20)
CO2: 33 mmol/L — ABNORMAL HIGH (ref 22–32)
Calcium: 9.1 mg/dL (ref 8.9–10.3)
Chloride: 99 mmol/L — ABNORMAL LOW (ref 101–111)
Creatinine, Ser: 2.12 mg/dL — ABNORMAL HIGH (ref 0.61–1.24)
GFR calc Af Amer: 29 mL/min — ABNORMAL LOW (ref >60)
GFR, EST NON AFRICAN AMERICAN: 25 mL/min — AB (ref >60)
GLUCOSE: 107 mg/dL — AB (ref 65–99)
POTASSIUM: 3.7 mmol/L (ref 3.5–5.1)
SODIUM: 142 mmol/L (ref 135–145)

## 2016-03-20 LAB — PROTIME-INR
INR: 1.21 (ref 0.00–1.49)
Prothrombin Time: 15 seconds (ref 11.6–15.2)

## 2016-03-20 LAB — PREPARE RBC (CROSSMATCH)

## 2016-03-20 MED ORDER — DIAZEPAM 2 MG PO TABS
1.0000 mg | ORAL_TABLET | Freq: Every day | ORAL | Status: DC
  Administered 2016-03-20: 1 mg via ORAL
  Filled 2016-03-20: qty 1

## 2016-03-20 MED ORDER — LEVOTHYROXINE SODIUM 50 MCG PO TABS
50.0000 ug | ORAL_TABLET | Freq: Every day | ORAL | Status: DC
  Administered 2016-03-21: 50 ug via ORAL
  Filled 2016-03-20: qty 1

## 2016-03-20 MED ORDER — ATORVASTATIN CALCIUM 10 MG PO TABS
10.0000 mg | ORAL_TABLET | Freq: Every day | ORAL | Status: DC
  Administered 2016-03-21: 10 mg via ORAL
  Filled 2016-03-20: qty 1

## 2016-03-20 MED ORDER — FERROUS SULFATE 325 (65 FE) MG PO TABS
325.0000 mg | ORAL_TABLET | Freq: Every evening | ORAL | Status: DC
  Administered 2016-03-20 – 2016-03-21 (×2): 325 mg via ORAL
  Filled 2016-03-20 (×2): qty 1

## 2016-03-20 MED ORDER — PANTOPRAZOLE SODIUM 40 MG PO TBEC
40.0000 mg | DELAYED_RELEASE_TABLET | Freq: Every day | ORAL | Status: DC
  Administered 2016-03-21: 40 mg via ORAL
  Filled 2016-03-20: qty 1

## 2016-03-20 MED ORDER — ONDANSETRON HCL 4 MG PO TABS: 4.0000 mg | ORAL_TABLET | Freq: Four times a day (QID) | ORAL | Status: DC | PRN

## 2016-03-20 MED ORDER — AZELASTINE HCL 0.1 % NA SOLN
1.0000 | Freq: Two times a day (BID) | NASAL | Status: DC
  Administered 2016-03-20 – 2016-03-21 (×2): 1 via NASAL
  Filled 2016-03-20: qty 30

## 2016-03-20 MED ORDER — CARVEDILOL 6.25 MG PO TABS
6.2500 mg | ORAL_TABLET | Freq: Two times a day (BID) | ORAL | Status: DC
  Administered 2016-03-21 (×2): 6.25 mg via ORAL
  Filled 2016-03-20 (×2): qty 1

## 2016-03-20 MED ORDER — LORATADINE 10 MG PO TABS
10.0000 mg | ORAL_TABLET | Freq: Every day | ORAL | Status: DC
  Administered 2016-03-20 – 2016-03-21 (×2): 10 mg via ORAL
  Filled 2016-03-20 (×2): qty 1

## 2016-03-20 MED ORDER — SODIUM CHLORIDE 0.9 % IV SOLN
Freq: Once | INTRAVENOUS | Status: DC
Start: 2016-03-20 — End: 2016-03-21

## 2016-03-20 MED ORDER — POTASSIUM CHLORIDE 20 MEQ/15ML (10%) PO SOLN
40.0000 meq | Freq: Every day | ORAL | Status: DC
  Administered 2016-03-21: 40 meq via ORAL
  Filled 2016-03-20 (×2): qty 30

## 2016-03-20 MED ORDER — RISAQUAD PO CAPS
1.0000 | ORAL_CAPSULE | Freq: Every evening | ORAL | Status: DC
  Administered 2016-03-20 – 2016-03-21 (×2): 1 via ORAL
  Filled 2016-03-20 (×2): qty 1

## 2016-03-20 MED ORDER — GLUCOSAMINE-CHONDROITIN 500-400 MG PO TABS: 1.0000 | ORAL_TABLET | Freq: Two times a day (BID) | ORAL | Status: DC

## 2016-03-20 MED ORDER — DORZOLAMIDE HCL 2 % OP SOLN
1.0000 [drp] | Freq: Two times a day (BID) | OPHTHALMIC | Status: DC
  Administered 2016-03-20 – 2016-03-21 (×2): 1 [drp] via OPHTHALMIC
  Filled 2016-03-20: qty 10

## 2016-03-20 MED ORDER — SENNOSIDES-DOCUSATE SODIUM 8.6-50 MG PO TABS
1.0000 | ORAL_TABLET | Freq: Every day | ORAL | Status: DC
  Administered 2016-03-20: 1 via ORAL
  Filled 2016-03-20: qty 1

## 2016-03-20 MED ORDER — TORSEMIDE 20 MG PO TABS
40.0000 mg | ORAL_TABLET | Freq: Every day | ORAL | Status: DC
  Administered 2016-03-21: 40 mg via ORAL
  Filled 2016-03-20: qty 2

## 2016-03-20 MED ORDER — ADULT MULTIVITAMIN W/MINERALS CH
1.0000 | ORAL_TABLET | Freq: Every day | ORAL | Status: DC
  Administered 2016-03-21: 1 via ORAL
  Filled 2016-03-20: qty 1

## 2016-03-20 MED ORDER — ONDANSETRON HCL 4 MG/2ML IJ SOLN: 4.0000 mg | Freq: Four times a day (QID) | INTRAMUSCULAR | Status: DC | PRN

## 2016-03-20 MED ORDER — ACETAMINOPHEN 325 MG PO TABS
975.0000 mg | ORAL_TABLET | Freq: Every morning | ORAL | Status: DC
  Administered 2016-03-21: 975 mg via ORAL
  Filled 2016-03-20: qty 3

## 2016-03-20 MED ORDER — FLUTICASONE PROPIONATE 50 MCG/ACT NA SUSP
1.0000 | Freq: Every day | NASAL | Status: DC
  Administered 2016-03-21: 1 via NASAL
  Filled 2016-03-20: qty 16

## 2016-03-20 MED ORDER — FUROSEMIDE 10 MG/ML IJ SOLN
20.0000 mg | Freq: Once | INTRAMUSCULAR | Status: AC
  Administered 2016-03-21: 20 mg via INTRAVENOUS
  Filled 2016-03-20: qty 2

## 2016-03-20 MED ORDER — BRIMONIDINE TARTRATE 0.15 % OP SOLN
1.0000 [drp] | Freq: Two times a day (BID) | OPHTHALMIC | Status: DC
  Administered 2016-03-20 – 2016-03-21 (×2): 1 [drp] via OPHTHALMIC
  Filled 2016-03-20: qty 5

## 2016-03-20 MED ORDER — HYDRALAZINE HCL 25 MG PO TABS
25.0000 mg | ORAL_TABLET | Freq: Three times a day (TID) | ORAL | Status: DC
  Administered 2016-03-20 – 2016-03-21 (×3): 25 mg via ORAL
  Filled 2016-03-20 (×3): qty 1

## 2016-03-20 MED ORDER — SODIUM CHLORIDE 0.9 % IV SOLN
Freq: Once | INTRAVENOUS | Status: AC
  Administered 2016-03-20: via INTRAVENOUS

## 2016-03-20 NOTE — ED Notes (Addendum)
Report called to oncoming night shift 3W RN; Floor nurse requested additional 30-40 minutes to receive report from day shift before patient comes up. Ok'd.

## 2016-03-20 NOTE — ED Provider Notes (Signed)
CSN: [id number]     Arrival date & time 03/20/16  1506 History   First MD Initiated Contact with Patient 03/20/16 1513     Chief Complaint  Patient presents with  . Abnormal Lab     (Consider location/radiation/quality/duration/timing/severity/associated sxs/prior Treatment) HPI  Victor Green is a(n) 82 y.o. male who presents to the ED with Anemia. The patient states that he does not know why he is here today. He is a poor historian. History is gathered from EMR and nursing report. He has a past medical history of hypertension, CAD, CHF, history of GI bleed in 2003, and history of blood transfusions for anemia. He is currently under the care of oncology for myelodysplastic disorder. The patient was just discharged from the hospital beginning of this month for symptomatic anemia. According to the nursing notes he had a routine visit and had his blood work done. I am unsure if it was yesterday or today. Apparently, the patient was called at home and told that he needed to go to the emergency department because his blood was low again. He has no other complaints at this time. He denies worst shortness of breath or dizziness. He does not walk at baseline. Past Medical History  Diagnosis Date  . Hypertension   . CAD (coronary artery disease) complete w/u in 2009    extensive coronary calcification by EBCT, normal myoview, echo with EF 70% mild AI  . Hyperlipidemia   . Aortic insufficiency     echo July 2011 EF 60%  . 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 History of GI bleed 2003    due to pyloric-positive ulcer   . Diverticulosis     with remote history of diverticulitis  . Plantar fasciitis     chronic  . Allergic rhinitis     responding well to desensitation injections  . Glaucoma   . Osteoarthritis     both knees  . Asthma   . CHF (congestive heart failure) ([place])   . History of blood transfusion "several"    "he's anemic"   Past Surgical History  Procedure  Laterality Date  . Tonsillectomy    . Hemorrhoid surgery  1996  . Inguinal hernia repair Right 1996  . Lumbar laminectomy  2003  . Cataract extraction Left   . Back surgery     Family History  Problem Relation Age of Onset  . Thrombosis Father     mesenteric artery thrombosis after prostate surgery  . Colon cancer Mother    Social History  Substance Use Topics  . Smoking status: Never Smoker   . Smokeless tobacco: Never Used  . Alcohol Use: No     Comment: etoh in the past    Review of Systems   Ten systems reviewed and are negative for acute change, except as noted in the HPI.   Allergies  Other and Tetanus toxoids  Home Medications   Prior to Admission medications   Medication Sig Start Date End Date Taking? Authorizing Provider  acetaminophen (TYLENOL) 325 MG tablet Take 975 mg by mouth every morning.     Historical Provider, MD  atorvastatin (LIPITOR) 10 MG tablet Take 1 tablet by mouth  daily 07/09/15   [name], MD  azelastine (ASTELIN) 0.1 % nasal spray Place 1 spray into both nostrils 2 (two) times daily. Use in each nostril as directed 08/20/14   [name], MD  brimonidine (ALPHAGAN) 0.15 % ophthalmic solution Place  1 drop into the left eye 2 (two) times daily.     Historical Provider, MD  carvedilol (COREG) 6.25 MG tablet Take 1 tablet (6.25 mg total) by mouth 2 (two) times daily with a meal. 09/22/15   Debbe Odea, MD  diazepam (VALIUM) 2 MG tablet Take 0.5 tablets (1 mg total) by mouth at bedtime. 10/11/15   [name], MD  dorzolamide (TRUSOPT) 2 % ophthalmic solution Place 1 drop into the left eye 2 (two) times daily.     Historical Provider, MD  EPIPEN 2-PAK 0.3 MG/0.3ML SOAJ injection Inject 0.75ml (0.3mg ) into  the muscle once as directed 12/26/14   [name], MD  ferrous sulfate 325 (65 FE) MG tablet Take 325 mg by mouth every evening.     Historical Provider, MD  fexofenadine (ALLEGRA) 180 MG tablet Take 90 mg by mouth every evening. @@ 1900    Historical  Provider, MD  fluticasone (FLONASE) 50 MCG/ACT nasal spray Use 2 sprays in each  nostril twice daily 08/21/15   [name], MD  glucosamine-chondroitin 500-400 MG tablet Take 1 tablet by mouth 2 (two) times daily.     Historical Provider, MD  hydrALAZINE (APRESOLINE) 25 MG tablet Take 1 tablet (25 mg total) by mouth 3 (three) times daily. 12/09/15   Ripudeep Krystal Eaton, MD  levothyroxine (SYNTHROID, LEVOTHROID) 50 MCG tablet Take 1 tablet by mouth  daily 07/01/15   [name], MD  metolazone (ZAROXOLYN) 2.5 MG tablet Take 1 tablet by mouth every Monday and Friday.  Hold if weight is 200lbs or less. 12/26/15   Jolaine Artist, MD  Multiple Vitamin (MULTIVITAMIN WITH MINERALS) TABS tablet Take 1 tablet by mouth daily.    Historical Provider, MD  pantoprazole (PROTONIX) 40 MG tablet Take 1 tablet by mouth  daily 09/25/15   [name], MD  potassium chloride 20 MEQ/15ML (10%) SOLN Take 30 mLs (40 mEq total) by mouth 2 (two) times a week. Take on Monday and Friday with metolazone Patient taking differently: Take 40 mEq by mouth daily. If pt weighs over 200 lbs give pt 80 meq daily with the double dose of either metolazone or torsemide, depending on the day 02/20/16   Monica Becton, MD  Probiotic Product (ALIGN) 4 MG CAPS Take 4 mg by mouth every evening.    Historical Provider, MD  senna-docusate (SENOKOT-S) 8.6-50 MG tablet Take 1 tablet by mouth at bedtime. 03/04/16   Florencia Reasons, MD  torsemide (DEMADEX) 20 MG tablet Take 40 mg by mouth daily. If pt weighs over 200lbs give an additional 40mg s to the regular 40mg  daily dose on all days other than Monday and Friday.    Historical Provider, MD   BP 123/58 mmHg  Pulse 63  Temp(Src) 98.2 F (36.8 C) (Oral)  Resp 18  SpO2 97% Physical Exam  Constitutional: He appears well-developed and well-nourished. No distress.  HENT:  Head: Normocephalic and atraumatic.  Eyes: Conjunctivae are normal. No scleral icterus.  Pink mucosal membranes  Neck: Normal range of  motion. Neck supple.  Cardiovascular: Normal rate, regular rhythm and normal heart sounds.   Pulmonary/Chest: Effort normal. No respiratory distress. He has rales (left lung).  Abdominal: Soft. There is no tenderness.  Musculoskeletal: He exhibits no edema.  Neurological: He is alert.  Skin: Skin is warm and dry. He is not diaphoretic.  Petechiae of the toes  Psychiatric: His behavior is normal.  Nursing note and vitals reviewed.   ED Course  Procedures (including critical care time)  Labs Review Labs Reviewed  BASIC METABOLIC PANEL - Abnormal; Notable for the following:    Chloride 99 (*)    CO2 33 (*)    Glucose, Bld 107 (*)    BUN 50 (*)    Creatinine, Ser 2.12 (*)    GFR calc non Af Amer 25 (*)    GFR calc Af Amer 29 (*)    All other components within normal limits  CBC WITH DIFFERENTIAL/PLATELET - Abnormal; Notable for the following:    WBC 33.9 (*)    RBC 2.35 (*)    Hemoglobin 7.4 (*)    HCT 22.8 (*)    RDW 19.6 (*)    Platelets 30 (*)    Neutro Abs 8.1 (*)    Lymphs Abs 4.7 (*)    Monocytes Absolute 3.4 (*)    Basophils Absolute 0.3 (*)    All other components within normal limits  PROTIME-INR  PATHOLOGIST SMEAR REVIEW  OCCULT BLOOD X 1 CARD TO LAB, STOOL  TYPE AND SCREEN    Imaging Review No results found. I have personally reviewed and evaluated these images and lab results as part of my medical decision-making.   EKG Interpretation None      MDM   Final diagnoses:  Myelodysplasia (myelodysplastic syndrome) (HCC)  Symptomatic anemia  CKD (chronic kidney disease), stage III    6:10 PM BP 123/58 mmHg  Pulse 63  Temp(Src) 98.2 F (36.8 C) (Oral)  Resp 18  SpO2 97% Patient with low hgb  I spoke with Dr. Irene Limbo Who feels the patient needs another transfusion of 2 units. He is also concerned about a leukemic conversion as his white blood cell count has steadily climbed and is now 33,000. He also states that the patient will likely need a care  plan so that he can receive transfusions in through oncology and avoid admissions. Oncology will consult tomorrow.    Margarita Mail, PA-C 03/25/16 1707  Isla Pence, MD 03/26/16 [phone]

## 2016-03-20 NOTE — ED Notes (Signed)
Attempted to call report. Was on hold for 8 min

## 2016-03-20 NOTE — ED Notes (Signed)
Pt from home via EMS with a critical hemoglobin of 6.0. Per EMS pt states that pt is more pale than normal. Pt also states his abdomen feels bloated, but pt denies pain with palpation. Pt is on 2L of oxygen at baseline, but his oxygen was 94% so his family turned his oxygen up to 3L and he is now at 100%. Pt recently had a transfusion for low hemoglobin secondary to leukemia. Pt states his doctor called today to tell him that his hemoglobin had dropped again and they suggested he come in for recollect of labs and possible transfusion

## 2016-03-20 NOTE — H&P (Signed)
History and Physical    Victor Green [id number] DOB: 07-Dec-1919 DOA: 03/20/2016  PCP: Velna Hatchet, MD  Outpatient oncologist: Dr. Lorna Few. Patient coming from: Nursing Home.  Chief Complaint: Abnormal blood test.  HPI: Victor Green is a 82 y.o. male with medical history significant of  hypertension, CAD, hyperlipidemia, aortic insufficiency, diverticulosis, glaucoma, osteoarthritis, asthma, diastolic CHF who comes to the emergency department via EMS after the patient had a hemoglobin level reported at 6.0 g/dL. The patient was recently admitted and transfused 2 units of packed RBCs due to symptomatic anemia secondary to myelodysplastic disorder and GI bleed.  Per family members, he was doing better after the last transfusion. However over the past few days, the patient has been sleeping more, increasingly more fatigued and tired. The patient and family members denied hematemesis, melena or hematochezia. They would like the patient to be set up for outpatient as needed packed RBCs transfusions, so the patient does not have to come to the emergency department each time this happens. Dr. Irene Limbo was contacted by the emergency department earlier, recommended transfusion and will evaluate the patient in the morning.  ED Course: Repeat hemoglobin level was 7.4 g/dL. Two units of packed RBCs were ordered.  Review of Systems: As per HPI otherwise 10 point review of systems negative.  Past Medical History  Diagnosis Date  . Hypertension   . CAD (coronary artery disease) complete w/u in 2009    extensive coronary calcification by EBCT, normal myoview, echo with EF 70% mild AI  . Hyperlipidemia   . Aortic insufficiency     echo July 2011 EF 60%  . 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 History of GI bleed 2003    due to pyloric-positive ulcer   . Diverticulosis     with remote history of diverticulitis  . Plantar fasciitis     chronic  . Allergic rhinitis      responding well to desensitation injections  . Glaucoma   . Osteoarthritis     both knees  . Asthma   . CHF (congestive heart failure) ([place])   . History of blood transfusion "several"    "he's anemic"    Past Surgical History  Procedure Laterality Date  . Tonsillectomy    . Hemorrhoid surgery  1996  . Inguinal hernia repair Right 1996  . Lumbar laminectomy  2003  . Cataract extraction Left   . Back surgery       reports that he has never smoked. He has never used smokeless tobacco. He reports that he does not drink alcohol or use illicit drugs.  Allergies  Allergen Reactions  . Other Anaphylaxis    Horse serum  . Tetanus Toxoids Other (See Comments)    Family History  Problem Relation Age of Onset  . Thrombosis Father     mesenteric artery thrombosis after prostate surgery  . Colon cancer Mother     Prior to Admission medications   Medication Sig Start Date End Date Taking? Authorizing Provider  acetaminophen (TYLENOL) 325 MG tablet Take 975 mg by mouth every morning.    Yes Historical Provider, MD  atorvastatin (LIPITOR) 10 MG tablet Take 1 tablet by mouth  daily Patient taking differently: Take 10 mg by mouth daily 07/09/15  Yes [name], MD  azelastine (ASTELIN) 0.1 % nasal spray Place 1 spray into both nostrils 2 (two) times daily. Use in each nostril as directed 08/20/14  Yes Ajay  Dwyane Dee, MD  brimonidine (ALPHAGAN) 0.15 % ophthalmic solution Place 1 drop into the left eye 2 (two) times daily.    Yes Historical Provider, MD  carvedilol (COREG) 6.25 MG tablet Take 1 tablet (6.25 mg total) by mouth 2 (two) times daily with a meal. 09/22/15  Yes Debbe Odea, MD  diazepam (VALIUM) 2 MG tablet Take 0.5 tablets (1 mg total) by mouth at bedtime. 10/11/15  Yes [name], MD  dorzolamide (TRUSOPT) 2 % ophthalmic solution Place 1 drop into the left eye 2 (two) times daily.    Yes Historical Provider, MD  EPIPEN 2-PAK 0.3 MG/0.3ML SOAJ injection Inject 0.27ml (0.3mg ) into   the muscle once as directed 12/26/14  Yes [name], MD  ferrous sulfate 325 (65 FE) MG tablet Take 325 mg by mouth every evening.    Yes Historical Provider, MD  fexofenadine (ALLEGRA) 180 MG tablet Take 90 mg by mouth every evening. @@ 1900   Yes Historical Provider, MD  fluticasone (FLONASE) 50 MCG/ACT nasal spray Use 2 sprays in each  nostril twice daily 08/21/15  Yes [name], MD  glucosamine-chondroitin 500-400 MG tablet Take 1 tablet by mouth 2 (two) times daily.    Yes Historical Provider, MD  hydrALAZINE (APRESOLINE) 25 MG tablet Take 1 tablet (25 mg total) by mouth 3 (three) times daily. 12/09/15  Yes Ripudeep Krystal Eaton, MD  levothyroxine (SYNTHROID, LEVOTHROID) 50 MCG tablet Take 1 tablet by mouth  daily Patient taking differently: Take 50 mcg by mouth daily 07/01/15  Yes [name], MD  metolazone (ZAROXOLYN) 2.5 MG tablet Take 1 tablet by mouth every Monday and Friday.  Hold if weight is 200lbs or less. 12/26/15  Yes Jolaine Artist, MD  Multiple Vitamin (MULTIVITAMIN WITH MINERALS) TABS tablet Take 1 tablet by mouth daily.   Yes Historical Provider, MD  pantoprazole (PROTONIX) 40 MG tablet Take 1 tablet by mouth  daily Patient taking differently: Take 40 mg by mouth daily.  09/25/15  Yes [name], MD  potassium chloride 20 MEQ/15ML (10%) SOLN Take 30 mLs (40 mEq total) by mouth 2 (two) times a week. Take on Monday and Friday with metolazone Patient taking differently: Take 40 mEq by mouth daily. If pt weighs over 200 lbs give pt 80 meq daily with the double dose of either metolazone or torsemide, depending on the day 02/20/16  Yes Padmaja Vasireddy, MD  Probiotic Product (ALIGN) 4 MG CAPS Take 4 mg by mouth every evening.   Yes Historical Provider, MD  senna-docusate (SENOKOT-S) 8.6-50 MG tablet Take 1 tablet by mouth at bedtime. 03/04/16  Yes Florencia Reasons, MD  torsemide (DEMADEX) 20 MG tablet Take 40 mg by mouth daily. If pt weighs over 200lbs give an additional 40mg s to the regular 40mg  daily  dose on all days other than Monday and Friday.   Yes Historical Provider, MD    Physical Exam: Filed Vitals:   03/20/16 1518 03/20/16 1523  BP:  123/58  Pulse:  63  Temp:  98.2 F (36.8 C)  TempSrc:  Oral  Resp:  18  SpO2: 96% 97%      Constitutional: NAD, calm, comfortable Filed Vitals:   03/20/16 1518 03/20/16 1523  BP:  123/58  Pulse:  63  Temp:  98.2 F (36.8 C)  TempSrc:  Oral  Resp:  18  SpO2: 96% 97%   Eyes: PERRL, lids and conjunctivae normal ENMT: Mucous membranes are moist. Posterior pharynx clear of any exudate or lesions.Normal dentition.  Neck: normal,  supple, no masses, no thyromegaly Respiratory: Bibasilar rales, no wheezing, no crackles. Normal respiratory effort. No accessory muscle use.  Cardiovascular: Regular rate and rhythm, no murmurs / rubs / gallops. No extremity edema. 2+ pedal pulses. No carotid bruits.  Abdomen: no tenderness, no masses palpated. No hepatosplenomegaly. Bowel sounds positive.  Musculoskeletal: no clubbing / cyanosis. No joint deformity upper and lower extremities. Good ROM, no contractures. Normal muscle tone.  Skin: no rashes, lesions, ulcers. No induration Neurologic: CN 2-12 grossly intact. Sensation intact, DTR normal. Strength 5/5 in all 4.  Psychiatric: Normal judgment and insight. Alert and oriented x 3. Normal mood.    Labs on Admission: I have personally reviewed following labs and imaging studies  CBC:  Recent Labs Lab 03/20/16 1535  WBC 33.9*  NEUTROABS 8.1*  HGB 7.4*  HCT 22.8*  MCV 97.0  PLT 30*   Basic Metabolic Panel:  Recent Labs Lab 03/20/16 1535  NA 142  K 3.7  CL 99*  CO2 33*  GLUCOSE 107*  BUN 50*  CREATININE 2.12*  CALCIUM 9.1   GFR: CrCl cannot be calculated (Unknown ideal weight.).  Coagulation Profile:  Recent Labs Lab 03/20/16 1535  INR 1.21   Urine analysis:    Component Value Date/Time   COLORURINE YELLOW 03/04/2016 [place] 03/04/2016 1048    LABSPEC 1.016 03/04/2016 1048   PHURINE 7.0 03/04/2016 1048   GLUCOSEU NEGATIVE 03/04/2016 1048   GLUCOSEU NEGATIVE 07/19/2013 1353   HGBUR NEGATIVE 03/04/2016 1048   BILIRUBINUR NEGATIVE 03/04/2016 1048   KETONESUR NEGATIVE 03/04/2016 1048   PROTEINUR 30* 03/04/2016 1048   UROBILINOGEN 0.2 09/15/2015 1408   NITRITE NEGATIVE 03/04/2016 1048   LEUKOCYTESUR NEGATIVE 03/04/2016 1048    Radiological Exams on Admission: Dg Chest 2 View  03/20/2016  CLINICAL DATA:  Critically low hemoglobin of 6.0 g, pale, abdominal bloating, recent transfusion, history hypertension, leukemia, coronary artery disease, asthma, CHF, myelodysplastic syndrome EXAM: CHEST  2 VIEW COMPARISON:  07/04/2016 FINDINGS: Enlargement of cardiac silhouette. Atherosclerotic calcification and tortuosity of thoracic aorta. Pulmonary vascularity normal. Chronic elevation of RIGHT diaphragm with RIGHT basilar atelectasis. Lungs otherwise clear. No pleural effusion or pneumothorax. BILATERAL chronic rotator cuff tears. IMPRESSION: Chronic elevation of RIGHT diaphragm with RIGHT basilar atelectasis. Enlargement of cardiac silhouette. Electronically Signed   By: Lavonia Dana M.D.   On: 03/20/2016 16:37    Echocardiogram 09/22/2015  ------------------------------------------------------------------- LV EF: 55% - 60%  ------------------------------------------------------------------- Indications: CHF - 428.0.  ------------------------------------------------------------------- History: PMH: Murmur. Coronary artery disease. Risk factors: Hypertension.  ------------------------------------------------------------------- Study Conclusions  - Left ventricle: The cavity size was normal. Wall thickness was  normal. Systolic function was normal. The estimated ejection  fraction was in the range of 55% to 60%. Wall motion was normal;  there were no regional wall motion abnormalities. Doppler  parameters are  consistent with abnormal left ventricular  relaxation (grade 1 diastolic dysfunction). - Aortic valve: There was no stenosis. There was trivial  regurgitation. - Mitral valve: Mildly calcified annulus. There was trivial  regurgitation. - Left atrium: The atrium was mildly dilated. - Right ventricle: The cavity size was normal. Systolic function  was normal. - Pulmonary arteries: No complete TR doppler jet so unable to  estimate PA systolic pressure. - Pericardium, extracardiac: A trivial pericardial effusion was  identified posterior to the heart. - Impressions: IVC measured 2.2 cm with < 50% respirophasic  variation, suggesting RA pressure 15 mmHg.  Impressions:  - IVC measured 2.2 cm with < 50% respirophasic  variation,  suggesting RA pressure 15 mmHg.   Assessment/Plan Principal Problem:   Symptomatic anemia   Myelodysplasia (myelodysplastic syndrome) (HCC) Admit to MedSurg/observation. Transfuse 2 units of packed RBCs as suggested by hematology. Follow-up CBC in a.m. Hematology/oncology will evaluate in the morning.  Active Problems:   Essential hypertension Continue carvedilol 6.25 mg by mouth twice a day Monitor blood pressure.    CAD (coronary artery disease) Continue aspirin, atorvastatin and beta blocker. Cardiology follow-ups as scheduled.    Pure hypercholesterolemia Atorvastatin 10 mg by mouth daily. Monitor lipid panel and LFTs periodically.    Hypothyroidism Continue levothyroxine 50 g daily supplementation. Monitor TSH periodically.    Chronic diastolic heart failure (HCC) Compensated. Low-dose furosemide in between transfusions. Continue Demadex 20 mg by mouth daily.    CKD (chronic kidney disease), stage III Monitor BUN/creatinine and electrolytes.       DVT prophylaxis: SCDs. Code Status: Full code. Family Communication: His son Shanon Brow and other family members are present in the room. Disposition Plan: Dr. Irene Limbo, from Hem-Onc will  see the patient in AM. Consults called:  Admission status: Observation/MedSurg   Reubin Milan MD [facility] Pager [phone].  If 7PM-7AM, please contact night-coverage [email or web address] Password [facility]  03/20/2016, 6:43 PM

## 2016-03-20 NOTE — Progress Notes (Signed)
PHARMACIST - PHYSICIAN ORDER COMMUNICATION  CONCERNING: P&T Medication Policy on Herbal Medications  DESCRIPTION:  This patient's order for: glucosamine-chondroitin   has been noted.  This product(s) is classified as an "herbal" or natural product. Due to a lack of definitive safety studies or FDA approval, nonstandard manufacturing practices, plus the potential risk of unknown drug-drug interactions while on inpatient medications, the Pharmacy and Therapeutics Committee does not permit the use of "herbal" or natural products of this type within [facility]   ACTION TAKEN: The pharmacy department is unable to verify this order at this time and your patient has been informed of this safety policy. Please reevaluate patient's clinical condition at discharge and address if the herbal or natural product(s) should be resumed at that time.  Dolly Rias RPh 03/20/2016, 9:08 PM Pager [phone]

## 2016-03-21 DIAGNOSIS — I10 Essential (primary) hypertension: Secondary | ICD-10-CM | POA: Diagnosis not present

## 2016-03-21 DIAGNOSIS — D696 Thrombocytopenia, unspecified: Secondary | ICD-10-CM | POA: Diagnosis not present

## 2016-03-21 DIAGNOSIS — D469 Myelodysplastic syndrome, unspecified: Secondary | ICD-10-CM | POA: Diagnosis not present

## 2016-03-21 DIAGNOSIS — E039 Hypothyroidism, unspecified: Secondary | ICD-10-CM

## 2016-03-21 DIAGNOSIS — D72829 Elevated white blood cell count, unspecified: Secondary | ICD-10-CM | POA: Diagnosis not present

## 2016-03-21 DIAGNOSIS — I5032 Chronic diastolic (congestive) heart failure: Secondary | ICD-10-CM | POA: Diagnosis not present

## 2016-03-21 DIAGNOSIS — D649 Anemia, unspecified: Secondary | ICD-10-CM | POA: Diagnosis not present

## 2016-03-21 DIAGNOSIS — N183 Chronic kidney disease, stage 3 (moderate): Secondary | ICD-10-CM | POA: Diagnosis not present

## 2016-03-21 LAB — COMPREHENSIVE METABOLIC PANEL
ALBUMIN: 3.4 g/dL — AB (ref 3.5–5.0)
ALT: 14 U/L — AB (ref 17–63)
AST: 28 U/L (ref 15–41)
Alkaline Phosphatase: 51 U/L (ref 38–126)
Anion gap: 12 (ref 5–15)
BILIRUBIN TOTAL: 0.8 mg/dL (ref 0.3–1.2)
BUN: 45 mg/dL — AB (ref 6–20)
CALCIUM: 8.8 mg/dL — AB (ref 8.9–10.3)
CO2: 30 mmol/L (ref 22–32)
CREATININE: 1.77 mg/dL — AB (ref 0.61–1.24)
Chloride: 98 mmol/L — ABNORMAL LOW (ref 101–111)
GFR calc Af Amer: 36 mL/min — ABNORMAL LOW (ref >60)
GFR calc non Af Amer: 31 mL/min — ABNORMAL LOW (ref >60)
GLUCOSE: 101 mg/dL — AB (ref 65–99)
Potassium: 2.7 mmol/L — CL (ref 3.5–5.1)
SODIUM: 140 mmol/L (ref 135–145)
TOTAL PROTEIN: 6.6 g/dL (ref 6.5–8.1)

## 2016-03-21 LAB — MAGNESIUM: Magnesium: 2 mg/dL (ref 1.7–2.4)

## 2016-03-21 MED ORDER — POTASSIUM CHLORIDE 20 MEQ/15ML (10%) PO SOLN
40.0000 meq | Freq: Once | ORAL | Status: AC
  Administered 2016-03-21: 40 meq via ORAL
  Filled 2016-03-21: qty 30

## 2016-03-21 MED ORDER — POTASSIUM CHLORIDE 20 MEQ/15ML (10%) PO SOLN
40.0000 meq | Freq: Once | ORAL | Status: AC
  Administered 2016-03-21: 40 meq via ORAL

## 2016-03-21 MED ORDER — POTASSIUM CHLORIDE 20 MEQ/15ML (10%) PO SOLN: 40.0000 meq | Freq: Every day | ORAL | Status: DC

## 2016-03-21 NOTE — Consult Note (Signed)
[place]  [phone]   HEMATOLOGY ONCOLOGY INPATIENT CONSULTATION   Victor Green  DOB: 04/15/1920  MR#: [id number]  CSN#: [id number]    Requesting Physician: [facility] Dr. Grandville Silos   Patient Care Team: Velna Hatchet, MD as PCP - General (Internal Medicine) Curt Bears, MD as Consulting Physician (Oncology)  Reason for consult: worsening anemia and thrombocytopenia   History of present illness: Victor Green is a 82 yo male with multiple medical comorbilities, and long standing history of anemia and thrombocytopenia, who was admitted for blood transfusion. He has been seen by my partner Dr. Julien Nordmann in [place], last was seen on 02/03/2016. His cytopenia was felt to be related to MDS,also has not been confirmed by bone marrow biopsy due to is advanced age and comorbidities. He has been receiving blood transfusion periodically,his blood transfusion needs has been increased lately, average every 10 days He has very good care system at home, has two CNAs, and blood draw at home. He is not physically active,usually stays in chair or bed, but mind is still very sharp per his son. Per his son, no bleeding, fever, or signs of infection daily. He does appear to be more drowsy, sleeps more when he has worsening anemia.  He was found to have hemoglobin 6.6 by home blood draw, and came to the emergency room yesterday, repeated hemoglobin7.4, he received 2 units of hemoglobin over night, hemoglobin 9 this morning. His platelet counts was 27 this morning, white count 30.9K today, which has been gradually increasing in the past 2 month.   MEDICAL HISTORY:  Past Medical History  Diagnosis Date  . Hypertension   . CAD (coronary artery disease) complete w/u in 2009    extensive coronary calcification by EBCT, normal myoview, echo with EF 70% mild AI  . Hyperlipidemia   . Aortic insufficiency     echo July 2011 EF 60%  . 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 History of GI bleed 2003    due to pyloric-positive ulcer   . Diverticulosis     with remote history of diverticulitis  . Plantar fasciitis     chronic  . Allergic rhinitis     responding well to desensitation injections  . Glaucoma   . Osteoarthritis     both knees  . Asthma   . CHF (congestive heart failure) ([place])   . History of blood transfusion "several"    "he's anemic"    SURGICAL HISTORY: Past Surgical History  Procedure Laterality Date  . Tonsillectomy    . Hemorrhoid surgery  1996  . Inguinal hernia repair Right 1996  . Lumbar laminectomy  2003  . Cataract extraction Left   . Back surgery      SOCIAL HISTORY: Social History   Social History  . Marital Status: Widowed    Spouse Name: N/A  . Number of Children: N/A  . Years of Education: N/A   Occupational History  . Not on file.   Social History Main Topics  . Smoking status: Never Smoker   . Smokeless tobacco: Never Used  . Alcohol Use: No     Comment: etoh in the past  . Drug Use: No  . Sexual Activity: Not Currently   Other Topics Concern  . Not on file   Social History Narrative    FAMILY HISTORY: Family History  Problem Relation Age of Onset  . Thrombosis Father  mesenteric artery thrombosis after prostate surgery  . Colon cancer Mother     ALLERGIES:  is allergic to other and tetanus toxoids.  MEDICATIONS:  Current Facility-Administered Medications  Medication Dose Route Frequency Provider Last Rate Last Dose  . 0.9 %  sodium chloride infusion   Intravenous Once Reubin Milan, MD      . acetaminophen (TYLENOL) tablet 975 mg  975 mg Oral q morning - 10a Reubin Milan, MD   975 mg at 03/21/16 1000  . acidophilus (RISAQUAD) capsule 1 capsule  1 capsule Oral QPM Reubin Milan, MD   1 capsule at 03/20/16 2330  . atorvastatin (LIPITOR) tablet 10 mg  10 mg Oral q1800 Reubin Milan, MD      . azelastine (ASTELIN) 0.1 % nasal spray 1 spray  1 spray  Each Nare BID Reubin Milan, MD   1 spray at 03/21/16 1012  . brimonidine (ALPHAGAN) 0.15 % ophthalmic solution 1 drop  1 drop Left Eye BID Reubin Milan, MD   1 drop at 03/21/16 1012  . carvedilol (COREG) tablet 6.25 mg  6.25 mg Oral BID WC Reubin Milan, MD   6.25 mg at 03/21/16 1001  . diazepam (VALIUM) tablet 1 mg  1 mg Oral QHS Reubin Milan, MD   1 mg at 03/20/16 2330  . dorzolamide (TRUSOPT) 2 % ophthalmic solution 1 drop  1 drop Left Eye BID Reubin Milan, MD   1 drop at 03/21/16 1012  . ferrous sulfate tablet 325 mg  325 mg Oral QPM Reubin Milan, MD   325 mg at 03/20/16 2353  . fluticasone (FLONASE) 50 MCG/ACT nasal spray 1 spray  1 spray Each Nare Daily Reubin Milan, MD   1 spray at 03/21/16 1012  . hydrALAZINE (APRESOLINE) tablet 25 mg  25 mg Oral TID Reubin Milan, MD   25 mg at 03/21/16 1000  . levothyroxine (SYNTHROID, LEVOTHROID) tablet 50 mcg  50 mcg Oral QAC breakfast Reubin Milan, MD   50 mcg at 03/21/16 0815  . loratadine (CLARITIN) tablet 10 mg  10 mg Oral Daily Reubin Milan, MD   10 mg at 03/21/16 1000  . multivitamin with minerals tablet 1 tablet  1 tablet Oral Daily Reubin Milan, MD   1 tablet at 03/21/16 1001  . ondansetron (ZOFRAN) tablet 4 mg  4 mg Oral Q6H PRN Reubin Milan, MD       Or  . ondansetron [facility]) injection 4 mg  4 mg Intravenous Q6H PRN Reubin Milan, MD      . pantoprazole (PROTONIX) EC tablet 40 mg  40 mg Oral Daily Reubin Milan, MD   40 mg at 03/21/16 1001  . potassium chloride 20 MEQ/15ML (10%) solution 40 mEq  40 mEq Oral Daily Reubin Milan, MD   40 mEq at 03/21/16 1001  . senna-docusate (Senokot-S) tablet 1 tablet  1 tablet Oral QHS Reubin Milan, MD   1 tablet at 03/20/16 2330  . torsemide (DEMADEX) tablet 40 mg  40 mg Oral Daily Reubin Milan, MD   40 mg at 03/21/16 1000    REVIEW OF SYSTEMS:   Constitutional: Denies fevers, chills or abnormal night sweats  (+) fatigue  Eyes: Denies blurriness of vision, double vision or watery eyes Ears, nose, mouth, throat, and face: Denies mucositis or sore throat Respiratory: Denies cough, dyspnea or wheezes Cardiovascular: Denies palpitation, chest discomfort or lower extremity swelling  Gastrointestinal:  Denies nausea, heartburn or change in bowel habits Skin: Denies abnormal skin rashes Lymphatics: Denies new lymphadenopathy or easy bruising Neurological:Denies numbness, tingling or new weaknesses Behavioral/Psych: Mood is stable, no new changes  All other systems were reviewed with the patient and are negative.  PHYSICAL EXAMINATION: ECOG PERFORMANCE STATUS: 4 - Bedbound  Filed Vitals:   03/21/16 0445 03/21/16 1300  BP: 127/47 125/60  Pulse: 59 56  Temp: 97.6 F (36.4 C) 98 F (36.7 C)  Resp: 16 18   Filed Weights   03/20/16 2043  Weight: 200 lb 12.8 oz (91.082 kg)    GENERAL:alert, no distress and comfortable SKIN: skin color, texture, turgor are normal, no rashes, petechia or ecchymosis EYES: normal, conjunctiva are pink and non-injected, sclera clear OROPHARYNX:no exudate, no erythema and lips, buccal mucosa, and tongue normal  NECK: supple, thyroid normal size, non-tender, without nodularity LYMPH:  no palpable lymphadenopathy in the cervical, axillary or inguinal LUNGS: clear to auscultation and percussion with normal breathing effort HEART: regular rate & rhythm and no murmurs and no lower extremity edema ABDOMEN:abdomen soft, non-tender and normal bowel sounds Musculoskeletal:no cyanosis of digits and no clubbing  PSYCH: alert & oriented x 3 with fluent speech NEURO: no focal motor/sensory deficits  LABORATORY DATA:  I have reviewed the data as listed Lab Results  Component Value Date   WBC 30.9* 03/21/2016   HGB 9.0* 03/21/2016   HCT 27.2* 03/21/2016   MCV 92.2 03/21/2016   PLT 27* 03/21/2016    Recent Labs  08/10/15 0536  02/18/16 1821  03/03/16 1817  03/04/16 0806 03/20/16 1535 03/21/16 0706  NA 140  < > 142  < > 138 143 142 140  K 4.1  < > 3.9  < > 3.7 3.4* 3.7 2.7*  CL 97*  < > 104  < > 95* 98* 99* 98*  CO2 34*  < > 28  < > 34* 32 33* 30  GLUCOSE 108*  < > 101*  < > 117* 100* 107* 101*  BUN 42*  < > 39*  < > 56* 56* 50* 45*  CREATININE 1.05  < > 1.83*  < > 1.94* 1.90* 2.12* 1.77*  CALCIUM 8.6*  < > 9.1  < > 9.0 9.0 9.1 8.8*  GFRNONAA 59*  < > 30*  < > 28* 29* 25* 31*  GFRAA >60  < > 35*  < > 33* 33* 29* 36*  PROT 5.4*  < > 6.4*  --  6.0*  --   --  6.6  ALBUMIN 2.8*  < > 3.2*  --  3.3*  --   --  3.4*  AST 42*  < > 33  --  29  --   --  28  ALT 21  < > 18  --  13*  --   --  14*  ALKPHOS 61  < > 54  --  50  --   --  51  BILITOT 1.3*  < > 0.8  --  0.8  --   --  0.8  BILIDIR 0.2  --   --   --   --   --   --   --   IBILI 1.1*  --   --   --   --   --   --   --   < > = values in this interval not displayed.  RADIOGRAPHIC STUDIES: I have personally reviewed the radiological images as listed and agreed with the  findings in the report. Dg Chest 2 View  03/20/2016  CLINICAL DATA:  Critically low hemoglobin of 6.0 g, pale, abdominal bloating, recent transfusion, history hypertension, leukemia, coronary artery disease, asthma, CHF, myelodysplastic syndrome EXAM: CHEST  2 VIEW COMPARISON:  07/04/2016 FINDINGS: Enlargement of cardiac silhouette. Atherosclerotic calcification and tortuosity of thoracic aorta. Pulmonary vascularity normal. Chronic elevation of RIGHT diaphragm with RIGHT basilar atelectasis. Lungs otherwise clear. No pleural effusion or pneumothorax. BILATERAL chronic rotator cuff tears. IMPRESSION: Chronic elevation of RIGHT diaphragm with RIGHT basilar atelectasis. Enlargement of cardiac silhouette. Electronically Signed   By: Lavonia Dana M.D.   On: 03/20/2016 16:37   Dg Chest Port 1 View  03/04/2016  CLINICAL DATA:  Leukocytosis EXAM: PORTABLE CHEST 1 VIEW COMPARISON:  02/18/2016 FINDINGS: Mild cardiac enlargement without pulmonary  vascular congestion. Elevation of right hemidiaphragm with scarring or atelectasis in the right lung base, similar to prior study. No developing consolidation or airspace disease. No blunting of costophrenic angles. No pneumothorax. Calcified and tortuous aorta peer IMPRESSION: Stable finding of elevated right hemidiaphragm and linear scarring or atelectasis in the right lung base. Mild cardiac enlargement. Electronically Signed   By: Lucienne Capers M.D.   On: 03/04/2016 01:33    ASSESSMENT & PLAN:  82 year old gentleman with long-standinghistory of anemia and thrombocytopenia,presented with worsening anemia and thrombocytopenia, and leukocytosis for the past 2 months.  1. Leukocytosis -His white count was normal up to 2 months ago, it has been gradually increased over the past 2 months, around 30K now, with increased neutrophil, lymphocytes, and monocytes. -he has no fever, chills, signs of infection -I reviewed his peripheral smear, he has significant left shift, including a few blasts. This is most concerning for progression of his underlying MDS, likely transforming to leukemia phase now. -I think myeloproliferative disorder is less likely, I'll discuss with Dr. Julien Nordmann and our hematology pathologist on Monday, to see if it worse of checking BCR/ABL, given CML is much more treatable   2. . Worsening anemia and thrombocytopenia, likely MDS -he has hadanemia and thrombocytopenia since 2011, has declined bone marrow biopsy due to his advanced age and comorbidities. His clinical presentation are most consistent with MDS -we'll continue supportive care with blood transfusion. I discussed the role of ESA, given his probable acute leukemia transformation, ESA may not be a good option now. -we discussed the risk of bleeding, and the benefit of platelet transfusion if pain count less than 20K  3. Goal of care   -we discussed his overall prognosis is very poor, giving the fact that his MDS if probably  transforming to acute leukemia now -We also discussed the option of hospice, giving his worsening hematological disorder. The son states they have very good support system at home, and wished him to continue blood transfusion for now.  Recommendations: -from hematology standpoint, OK to discharge home today. -we'll arrange his follow-up with Dr. Julien Nordmann on Monday.   All questions were answered. The patient knows to call the clinic with any problems, questions or concerns.      Truitt Merle, MD 03/21/2016 3:59 PM

## 2016-03-21 NOTE — Discharge Summary (Addendum)
Physician Discharge Summary  Victor Green ZOX:[id number] DOB: 09/15/1920 DOA: 03/20/2016  PCP: Velna Hatchet, MD  Admit date: 03/20/2016 Discharge date: 03/21/2016  Time spent: 65 minutes  Recommendations for Outpatient Follow-up:  1. Follow-up with Dr. Lorna Few. Office will call with appointment time. On follow-up patient will need a CBC with differential to follow-up on counts and monitor leukocytosis, anemia and thrombocytopenia. Patient will also need a basic metabolic profile to follow-up on electrolytes and renal function. 2. Follow-up with Velna Hatchet, MD in 2 weeks. On follow-up patient will need a basic metabolic profile to follow-up on electrolytes and renal function.   Discharge Diagnoses:  Principal Problem:   Symptomatic anemia Active Problems:   Leukocytosis   Essential hypertension   CAD (coronary artery disease)   Pure hypercholesterolemia   Hypothyroidism   Chronic diastolic heart failure (HCC)   CKD (chronic kidney disease), stage III   Myelodysplasia (myelodysplastic syndrome) ([place])   Discharge Condition: Stable and improved  Diet recommendation: Regular  Filed Weights   03/20/16 2043  Weight: 91.082 kg (200 lb 12.8 oz)    History of present illness:  Per Dr. Winona Legato is a 82 y.o. male with medical history significant of hypertension, CAD, hyperlipidemia, aortic insufficiency, diverticulosis, glaucoma, osteoarthritis, asthma, diastolic CHF who came to the emergency department via EMS after the patient had a hemoglobin level reported at 6.0 g/dL. The patient was recently admitted and transfused 2 units of packed RBCs due to symptomatic anemia secondary to myelodysplastic disorder and GI bleed.  Per family members, he was doing better after the last transfusion. However over the past few days, the patient has been sleeping more, increasingly more fatigued and tired. The patient and family members denied hematemesis, melena or  hematochezia. They would like the patient to be set up for outpatient as needed packed RBCs transfusions, so the patient does not have to come to the emergency department each time this happens. Dr. Irene Limbo was contacted by the emergency department earlier, recommended transfusion and will evaluate the patient in the morning.  ED Course: Repeat hemoglobin level was 7.4 g/dL. Two units of packed RBCs were ordered.  Hospital Course:  #1 symptomatic anemia and thrombocytopenia likely MDS Patient was admitted with a symptomatic anemia felt secondary to myelodysplastic syndrome. Patient is being followed by oncology and was recommended that patient be admitted under observation transfused 2 units of packed red blood cells and oncology will evaluate in the morning. Patient was transfused 2 units of packed red blood cells hemoglobin improved appropriately from 7.4 to 9.0. Patient noted to have anemia thrombocytopenia since 2011. Patient has declined bone marrow biopsy in the past due to advanced age and comorbidities. Family had decided on supportive care with blood transfusions as needed. Patient's platelet count remained stable. Patient was seen in consultation by oncology and patient will follow-up in the outpatient setting with oncology.  #2 leukocytosis Patient noted to have a leukocytosis which have increased gradually over the past 2 months with a white count of 30,000 with increased neutrophil, lymphocytes and monocytes. Patient had been afebrile with no signs or symptoms of infection. Oncology/hematology was consulted and assessed the patient and reviewed peripheral smear showed a significant left shift including a few blasts with concerns for progression of underlying MDS to leukemic phase. Patient is to follow-up with his oncologist/hematologist, Dr. Lorna Few in the outpatient setting. Outpatient follow-up.  #3 hypertension Remained stable during the hospitalization. Patient was meant 10 on  home regimen of  Coreg.  #4 hyperlipidemia Patient was maintained on statin.  #5 hypothyroidism Patient was maintained on home regimen of Synthroid.  #6 chronic diastolic heart failure Remained compensated. Patient was maintained on home regimen of Demadex.  #7 hypokalemia Secondary to diureses. Repleted. Outpatient follow-up.  #8 chronic kidney disease stage III Remained stable.   Procedures:  2 units packed red blood cells 5/19-5/20  Consultations:  Hematology oncology: Dr. Burr Medico 03/21/2016  Discharge Exam: Filed Vitals:   03/21/16 0445 03/21/16 1300  BP: 127/47 125/60  Pulse: 59 56  Temp: 97.6 F (36.4 C) 98 F (36.7 C)  Resp: 16 18    General: NAD Cardiovascular: RRR Respiratory: CTAB  Discharge Instructions   Discharge Instructions    Diet general    Complete by:  As directed      Discharge instructions    Complete by:  As directed   Follow up with Dr Earlie Server. Office will call with appointment time.     Increase activity slowly    Complete by:  As directed           Current Discharge Medication List    CONTINUE these medications which have NOT CHANGED   Details  acetaminophen (TYLENOL) 325 MG tablet Take 975 mg by mouth every morning.     atorvastatin (LIPITOR) 10 MG tablet Take 1 tablet by mouth  daily Qty: 90 tablet, Refills: 1    azelastine (ASTELIN) 0.1 % nasal spray Place 1 spray into both nostrils 2 (two) times daily. Use in each nostril as directed Qty: 30 mL, Refills: 3    brimonidine (ALPHAGAN) 0.15 % ophthalmic solution Place 1 drop into the left eye 2 (two) times daily.     carvedilol (COREG) 6.25 MG tablet Take 1 tablet (6.25 mg total) by mouth 2 (two) times daily with a meal. Qty: 60 tablet, Refills: 0    diazepam (VALIUM) 2 MG tablet Take 0.5 tablets (1 mg total) by mouth at bedtime. Qty: 90 tablet, Refills: 1    dorzolamide (TRUSOPT) 2 % ophthalmic solution Place 1 drop into the left eye 2 (two) times daily.     EPIPEN  2-PAK 0.3 MG/0.3ML SOAJ injection Inject 0.11m (0.318m into  the muscle once as directed Qty: 2 Device, Refills: 0    ferrous sulfate 325 (65 FE) MG tablet Take 325 mg by mouth every evening.     fexofenadine (ALLEGRA) 180 MG tablet Take 90 mg by mouth every evening. @@ 1900    fluticasone (FLONASE) 50 MCG/ACT nasal spray Use 2 sprays in each  nostril twice daily Qty: 64 g, Refills: 1    glucosamine-chondroitin 500-400 MG tablet Take 1 tablet by mouth 2 (two) times daily.     hydrALAZINE (APRESOLINE) 25 MG tablet Take 1 tablet (25 mg total) by mouth 3 (three) times daily. Qty: 90 tablet, Refills: 3    levothyroxine (SYNTHROID, LEVOTHROID) 50 MCG tablet Take 1 tablet by mouth  daily Qty: 90 tablet, Refills: 1    metolazone (ZAROXOLYN) 2.5 MG tablet Take 1 tablet by mouth every Monday and Friday.  Hold if weight is 200lbs or less. Qty: 10 tablet, Refills: 3    Multiple Vitamin (MULTIVITAMIN WITH MINERALS) TABS tablet Take 1 tablet by mouth daily.    pantoprazole (PROTONIX) 40 MG tablet Take 1 tablet by mouth  daily Qty: 90 tablet, Refills: 1    potassium chloride 20 MEQ/15ML (10%) SOLN Take 30 mLs (40 mEq total) by mouth 2 (two) times a week. Take on Monday and  Friday with metolazone Qty: 450 mL, Refills: 0    Probiotic Product (ALIGN) 4 MG CAPS Take 4 mg by mouth every evening.    senna-docusate (SENOKOT-S) 8.6-50 MG tablet Take 1 tablet by mouth at bedtime. Qty: 30 tablet, Refills: 0    torsemide (DEMADEX) 20 MG tablet Take 40 mg by mouth daily. If pt weighs over 200lbs give an additional 51ms to the regular 410mdaily dose on all days other than Monday and Friday.       Allergies  Allergen Reactions  . Other Anaphylaxis    Horse serum  . Tetanus Toxoids Other (See Comments)   Follow-up Information    Follow up with MOEilleen Kempf MD.   Specialty:  Oncology   Why:  office will call with appointment time.   Contact information:   [phone]     Follow up with HOVelna HatchetMD. Schedule an appointment as soon as possible for a visit in 2 weeks.   Specialty:  Internal Medicine   Contact information:   [phone]      The results of significant diagnostics from this hospitalization (including imaging, microbiology, ancillary and laboratory) are listed below for reference.    Significant Diagnostic Studies: Dg Chest 2 View  03/20/2016  CLINICAL DATA:  Critically low hemoglobin of 6.0 g, pale, abdominal bloating, recent transfusion, history hypertension, leukemia, coronary artery disease, asthma, CHF, myelodysplastic syndrome EXAM: CHEST  2 VIEW COMPARISON:  07/04/2016 FINDINGS: Enlargement of cardiac silhouette. Atherosclerotic calcification and tortuosity of thoracic aorta. Pulmonary vascularity normal. Chronic elevation of RIGHT diaphragm with RIGHT basilar atelectasis. Lungs otherwise clear. No pleural effusion or pneumothorax. BILATERAL chronic rotator cuff tears. IMPRESSION: Chronic elevation of RIGHT diaphragm with RIGHT basilar atelectasis. Enlargement of cardiac silhouette. Electronically Signed   By: MaLavonia Dana.D.   On: 03/20/2016 16:37   Dg Chest Port 1 View  03/04/2016  CLINICAL DATA:  Leukocytosis EXAM: PORTABLE CHEST 1 VIEW COMPARISON:  02/18/2016 FINDINGS: Mild cardiac enlargement without pulmonary vascular congestion. Elevation of right hemidiaphragm with scarring or atelectasis in the right lung base, similar to prior study. No developing consolidation or airspace disease. No blunting of costophrenic angles. No pneumothorax. Calcified and tortuous aorta peer IMPRESSION: Stable finding of elevated right hemidiaphragm and linear scarring or atelectasis in the right lung base. Mild cardiac enlargement. Electronically Signed   By: WiLucienne Capers.D.   On: 03/04/2016 01:33    Microbiology: No results found for this or any previous visit (from the  past 240 hour(s)).   Labs: Basic Metabolic Panel:  Recent Labs Lab 03/20/16 1535 03/21/16 0706  NA 142 140  K 3.7 2.7*  CL 99* 98*  CO2 33* 30  GLUCOSE 107* 101*  BUN 50* 45*  CREATININE 2.12* 1.77*  CALCIUM 9.1 8.8*  MG  --  2.0   Liver Function Tests:  Recent Labs Lab 03/21/16 0706  AST 28  ALT 14*  ALKPHOS 51  BILITOT 0.8  PROT 6.6  ALBUMIN 3.4*   No results for input(s): LIPASE, AMYLASE in the last 168 hours. No results for input(s): AMMONIA in the last 168 hours. CBC:  Recent Labs Lab 03/20/16 1535 03/21/16 0706  WBC 33.9* 30.9*  NEUTROABS 8.1* 13.0*  HGB 7.4* 9.0*  HCT 22.8* 27.2*  MCV 97.0 92.2  PLT 30* 27*   Cardiac Enzymes: No results for input(s): CKTOTAL, CKMB, CKMBINDEX, TROPONINI in the last  168 hours. BNP: BNP (last 3 results)  Recent Labs  11/04/15 1607 12/03/15 0130 02/18/16 1821  BNP 162.6* 240.6* 354.7*    ProBNP (last 3 results) No results for input(s): PROBNP in the last 8760 hours.  CBG: No results for input(s): GLUCAP in the last 168 hours.     SignedIrine Seal MD.  [facility] 03/21/2016, 4:47 PM

## 2016-03-23 LAB — CBC WITH DIFFERENTIAL/PLATELET
BAND NEUTROPHILS: 9 %
BASOS ABS: 0 10*3/uL (ref 0.0–0.1)
BLASTS: 5 %
Basophils Relative: 0 %
EOS ABS: 0.3 10*3/uL (ref 0.0–0.7)
Eosinophils Relative: 1 %
HEMATOCRIT: 27.2 % — AB (ref 39.0–52.0)
HEMOGLOBIN: 9 g/dL — AB (ref 13.0–17.0)
LYMPHS PCT: 16 %
Lymphs Abs: 4.9 10*3/uL — ABNORMAL HIGH (ref 0.7–4.0)
MCH: 30.5 pg (ref 26.0–34.0)
MCHC: 33.1 g/dL (ref 30.0–36.0)
MCV: 92.2 fL (ref 78.0–100.0)
MYELOCYTES: 26 %
Metamyelocytes Relative: 0 %
Monocytes Absolute: 5.3 10*3/uL — ABNORMAL HIGH (ref 0.1–1.0)
Monocytes Relative: 17 %
NEUTROS ABS: 18.8 10*3/uL — AB (ref 1.7–7.7)
Neutrophils Relative %: 20 %
PROMYELOCYTES ABS: 6 %
Platelets: 27 10*3/uL — CL (ref 150–400)
RBC: 2.95 MIL/uL — ABNORMAL LOW (ref 4.22–5.81)
RDW: 19.5 % — ABNORMAL HIGH (ref 11.5–15.5)
WBC: 30.9 10*3/uL — ABNORMAL HIGH (ref 4.0–10.5)
nRBC: 0 /100 WBC

## 2016-03-23 LAB — TYPE AND SCREEN
ABO/RH(D): O POS
ANTIBODY SCREEN: POSITIVE
DAT, IgG: NEGATIVE
Donor AG Type: NEGATIVE
Donor AG Type: NEGATIVE
Unit division: 0
Unit division: 0

## 2016-03-23 LAB — PATHOLOGIST SMEAR REVIEW

## 2016-03-26 ENCOUNTER — Telehealth: Payer: Self-pay | Admitting: Medical Oncology

## 2016-03-26 NOTE — Telephone Encounter (Signed)
asking about other lab tests that may have been ordered this week and f/u with [facility]. Note to [place].

## 2016-03-27 ENCOUNTER — Encounter: Payer: Self-pay | Admitting: Endocrinology

## 2016-03-31 ENCOUNTER — Telehealth: Payer: Self-pay | Admitting: *Deleted

## 2016-03-31 NOTE — Telephone Encounter (Signed)
Call from [facility] regarding pt feeling fatigue. "The RN will be coming out to the pt today for lab work as he is having fatigue, short of breath with movements and may need a blood transfusion. He was discharged from the hospital and they said he should follow up with Dr. Julien Nordmann for a blood transfusion." Pt discharged from [facility] 5/20 Informed Aide I will give MD concerns and call back with additional information. No appts in system for pt at this time.

## 2016-03-31 NOTE — Telephone Encounter (Signed)
After review of [name] and pt concerns with MD,  Per MD, pt will need transfusion if hgb below 7. Call to [facility] above information and to check status of lab results. Pt Hgb 7.3. Dr. Nanine Means is pt's Home PCP who has recommended hospice/pallative care for pt.   POF cancelled. Message left on melissa dale's desk to cancel POF

## 2016-04-01 ENCOUNTER — Telehealth: Payer: Self-pay | Admitting: Internal Medicine

## 2016-04-01 NOTE — Telephone Encounter (Signed)
Per 5/30 pof and 5/30 nurse telephone note - cancel request per 5/30 pof for f/u - patient being referred to hospice - new patient/hosp f/u.

## 2016-04-02 ENCOUNTER — Other Ambulatory Visit (HOSPITAL_COMMUNITY): Payer: Self-pay | Admitting: *Deleted

## 2016-04-03 ENCOUNTER — Ambulatory Visit (HOSPITAL_COMMUNITY)
Admission: RE | Admit: 2016-04-03 | Discharge: 2016-04-03 | Disposition: A | Discharge status: Home / Self Care | Payer: Medicare Other | Source: Ambulatory Visit | Attending: Internal Medicine | Admitting: Internal Medicine

## 2016-04-03 DIAGNOSIS — D471 Chronic myeloproliferative disease: Secondary | ICD-10-CM | POA: Insufficient documentation

## 2016-04-03 DIAGNOSIS — D500 Iron deficiency anemia secondary to blood loss (chronic): Secondary | ICD-10-CM | POA: Diagnosis not present

## 2016-04-03 DIAGNOSIS — D469 Myelodysplastic syndrome, unspecified: Secondary | ICD-10-CM | POA: Diagnosis not present

## 2016-04-03 DIAGNOSIS — C9590 Leukemia, unspecified not having achieved remission: Secondary | ICD-10-CM | POA: Insufficient documentation

## 2016-04-03 DIAGNOSIS — D696 Thrombocytopenia, unspecified: Secondary | ICD-10-CM | POA: Insufficient documentation

## 2016-04-03 LAB — PREPARE RBC (CROSSMATCH)

## 2016-04-03 MED ORDER — SODIUM CHLORIDE 0.9 % IV SOLN: Freq: Once | INTRAVENOUS | Status: DC

## 2016-04-03 NOTE — Progress Notes (Signed)
Pt came in today for scheduled blood transfusion.  We drew the pt's blood to send to lab for a type and cross.  Pt's son and his nurse and 2 aids were with him.  They stated that he "got blood on last Saturday and it took 12 hours to type because of his antibodies".  Blood bank called at 930 and stated that the patient had several antibodies and he had cold antibodies and it would take a couple more hours.  At 1130 the blood bank called back and said that he had a new antibody show up that he has not had before.  It was going to take several more hours to finish typing.  I informed the family.  I explained about the antibodies and how it was going to take along time and I could not guarantee that it would even be ready today.  They have agreed to come back Monday morning to get the infusion.  I explained the importance of not cutting off his blue blood band as it matches him to the blood we typed today.  I also told them that if he develops any symptoms this weekend such as chest pain shortness of breath weakness bleeding to come to the emergency room.  The nurse stated that he was going to have his blood drawn Sunday and if his hemoglobin was less than 6 she would take him to the ED.  We further talked about how in the future if he needs blood we need to see him 2 days in advance to type and screen him so that we can be sure we will have the best and safest match for him.  They were all in agreement.  After the patient left the blood bank called at 1430 and said he had 2 more antibodies show up and one was warm.  I talked to the PA for the patient Mr Marjory Lies and explained to him about the antibodies and the need for coming in earlier to get typed and screened.  He was very reasonable and understanding.  We will see the patient Monday morning at 0800.

## 2016-04-05 ENCOUNTER — Encounter (HOSPITAL_COMMUNITY): Payer: Self-pay

## 2016-04-05 ENCOUNTER — Emergency Department (HOSPITAL_COMMUNITY)
Admission: EM | Admit: 2016-04-05 | Discharge: 2016-04-05 | Disposition: A | Discharge status: Home / Self Care | Payer: Medicare Other | Attending: Emergency Medicine | Admitting: Emergency Medicine

## 2016-04-05 DIAGNOSIS — I110 Hypertensive heart disease with heart failure: Secondary | ICD-10-CM | POA: Insufficient documentation

## 2016-04-05 DIAGNOSIS — D469 Myelodysplastic syndrome, unspecified: Secondary | ICD-10-CM | POA: Insufficient documentation

## 2016-04-05 DIAGNOSIS — IMO0001 Reserved for inherently not codable concepts without codable children: Secondary | ICD-10-CM | POA: Insufficient documentation

## 2016-04-05 DIAGNOSIS — I2510 Atherosclerotic heart disease of native coronary artery without angina pectoris: Secondary | ICD-10-CM | POA: Diagnosis not present

## 2016-04-05 DIAGNOSIS — D649 Anemia, unspecified: Secondary | ICD-10-CM | POA: Diagnosis present

## 2016-04-05 DIAGNOSIS — E785 Hyperlipidemia, unspecified: Secondary | ICD-10-CM | POA: Diagnosis not present

## 2016-04-05 DIAGNOSIS — J45909 Unspecified asthma, uncomplicated: Secondary | ICD-10-CM | POA: Diagnosis not present

## 2016-04-05 DIAGNOSIS — I509 Heart failure, unspecified: Secondary | ICD-10-CM | POA: Insufficient documentation

## 2016-04-05 HISTORY — DX: Reserved for inherently not codable concepts without codable children: IMO0001

## 2016-04-05 HISTORY — DX: Myelodysplastic syndrome, unspecified: D46.9

## 2016-04-05 LAB — CBC
HCT: 22 % — ABNORMAL LOW (ref 39.0–52.0)
HEMOGLOBIN: 7.3 g/dL — AB (ref 13.0–17.0)
MCH: 30.3 pg (ref 26.0–34.0)
MCHC: 33.2 g/dL (ref 30.0–36.0)
MCV: 91.3 fL (ref 78.0–100.0)
Platelets: 31 10*3/uL — ABNORMAL LOW (ref 150–400)
RBC: 2.41 MIL/uL — AB (ref 4.22–5.81)
RDW: 18.8 % — ABNORMAL HIGH (ref 11.5–15.5)
WBC: 51 10*3/uL (ref 4.0–10.5)

## 2016-04-05 LAB — BASIC METABOLIC PANEL
Anion gap: 10 (ref 5–15)
BUN: 56 mg/dL — AB (ref 6–20)
CHLORIDE: 99 mmol/L — AB (ref 101–111)
CO2: 31 mmol/L (ref 22–32)
Calcium: 9.4 mg/dL (ref 8.9–10.3)
Creatinine, Ser: 1.9 mg/dL — ABNORMAL HIGH (ref 0.61–1.24)
GFR calc Af Amer: 33 mL/min — ABNORMAL LOW (ref >60)
GFR calc non Af Amer: 29 mL/min — ABNORMAL LOW (ref >60)
GLUCOSE: 125 mg/dL — AB (ref 65–99)
POTASSIUM: 3.7 mmol/L (ref 3.5–5.1)
Sodium: 140 mmol/L (ref 135–145)

## 2016-04-05 NOTE — ED Notes (Signed)
Bed: [id number] Expected date:  Expected time:  Means of arrival:  Comments: EMS for lab work

## 2016-04-05 NOTE — Discharge Instructions (Signed)
Hb is 7.2 today. Calling short stay revealed that patient does have 2 units of blood ready. Please proceed to short stay tomorrow as planned.

## 2016-04-05 NOTE — ED Provider Notes (Signed)
CSN: [id number]     Arrival date & time 04/05/16  [id number] History   First MD Initiated Contact with Patient 04/05/16 [phone]     Chief Complaint  Patient presents with  . Anemia     (Consider location/radiation/quality/duration/timing/severity/associated sxs/prior Treatment) HPI Comments: Pt comes in with cc of blood check. Pt has hx of myelodysplasia. He has his CNA and RNs at bedside. They report that pt is supposed to get blood transfusion at short stay tomorrow-  But they were informed that transfusion wouldn't be done if his Hb < 6 -so essentially they are here for blood draw. Pt denies nausea, emesis, fevers, chills, chest pains, shortness of breath, headaches, abdominal pain, uti like symptoms.   Patient is a 82 y.o. male presenting with anemia. The history is provided by the patient.  Anemia Pertinent negatives include no chest pain and no shortness of breath.    Past Medical History  Diagnosis Date  . Hypertension   . CAD (coronary artery disease) complete w/u in 2009    extensive coronary calcification by EBCT, normal myoview, echo with EF 70% mild AI  . Hyperlipidemia   . Aortic insufficiency     echo July 2011 EF 60%  . 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 History of GI bleed 2003    due to pyloric-positive ulcer   . Diverticulosis     with remote history of diverticulitis  . Plantar fasciitis     chronic  . Allergic rhinitis     responding well to desensitation injections  . Glaucoma   . Osteoarthritis     both knees  . Asthma   . CHF (congestive heart failure) ([place])   . History of blood transfusion "several"    "he's anemic"  . Myelodysplasia    Past Surgical History  Procedure Laterality Date  . Tonsillectomy    . Hemorrhoid surgery  1996  . Inguinal hernia repair Right 1996  . Lumbar laminectomy  2003  . Cataract extraction Left   . Back surgery     Family History  Problem Relation Age of Onset  . Thrombosis Father     mesenteric  artery thrombosis after prostate surgery  . Colon cancer Mother    Social History  Substance Use Topics  . Smoking status: Never Smoker   . Smokeless tobacco: Never Used  . Alcohol Use: No     Comment: etoh in the past    Review of Systems  Constitutional: Negative for activity change.  Respiratory: Negative for shortness of breath.   Cardiovascular: Negative for chest pain.  Genitourinary: Negative for dysuria.      Allergies  Other and Tetanus toxoids  Home Medications   Prior to Admission medications   Medication Sig Start Date End Date Taking? Authorizing Provider  acetaminophen (TYLENOL) 325 MG tablet Take 975 mg by mouth every morning.     Historical Provider, MD  atorvastatin (LIPITOR) 10 MG tablet Take 1 tablet by mouth  daily Patient taking differently: Take 10 mg by mouth daily 07/09/15   [name], MD  azelastine (ASTELIN) 0.1 % nasal spray Place 1 spray into both nostrils 2 (two) times daily. Use in each nostril as directed 08/20/14   [name], MD  brimonidine (ALPHAGAN) 0.15 % ophthalmic solution Place 1 drop into the left eye 2 (two) times daily.     Historical Provider, MD  carvedilol (COREG) 6.25 MG tablet Take 1 tablet (6.25 mg total)  by mouth 2 (two) times daily with a meal. 09/22/15   Debbe Odea, MD  diazepam (VALIUM) 2 MG tablet Take 0.5 tablets (1 mg total) by mouth at bedtime. 10/11/15   [name], MD  dorzolamide (TRUSOPT) 2 % ophthalmic solution Place 1 drop into the left eye 2 (two) times daily.     Historical Provider, MD  EPIPEN 2-PAK 0.3 MG/0.3ML SOAJ injection Inject 0.35ml (0.3mg ) into  the muscle once as directed 12/26/14   [name], MD  ferrous sulfate 325 (65 FE) MG tablet Take 325 mg by mouth every evening.     Historical Provider, MD  fexofenadine (ALLEGRA) 180 MG tablet Take 90 mg by mouth every evening. @@ 1900    Historical Provider, MD  fluticasone (FLONASE) 50 MCG/ACT nasal spray Use 2 sprays in each  nostril twice daily 08/21/15   [name], MD  glucosamine-chondroitin 500-400 MG tablet Take 1 tablet by mouth 2 (two) times daily.     Historical Provider, MD  hydrALAZINE (APRESOLINE) 25 MG tablet Take 1 tablet (25 mg total) by mouth 3 (three) times daily. 12/09/15   Ripudeep Krystal Eaton, MD  levothyroxine (SYNTHROID, LEVOTHROID) 50 MCG tablet Take 1 tablet by mouth  daily Patient taking differently: Take 50 mcg by mouth daily 07/01/15   [name], MD  metolazone (ZAROXOLYN) 2.5 MG tablet Take 1 tablet by mouth every Monday and Friday.  Hold if weight is 200lbs or less. 12/26/15   Jolaine Artist, MD  Multiple Vitamin (MULTIVITAMIN WITH MINERALS) TABS tablet Take 1 tablet by mouth daily.    Historical Provider, MD  pantoprazole (PROTONIX) 40 MG tablet Take 1 tablet by mouth  daily Patient taking differently: Take 40 mg by mouth daily.  09/25/15   [name], MD  potassium chloride 20 MEQ/15ML (10%) SOLN Take 30 mLs (40 mEq total) by mouth 2 (two) times a week. Take on Monday and Friday with metolazone Patient taking differently: Take 40 mEq by mouth daily. If pt weighs over 200 lbs give pt 80 meq daily with the double dose of either metolazone or torsemide, depending on the day 02/20/16   Monica Becton, MD  Probiotic Product (ALIGN) 4 MG CAPS Take 4 mg by mouth every evening.    Historical Provider, MD  senna-docusate (SENOKOT-S) 8.6-50 MG tablet Take 1 tablet by mouth at bedtime. 03/04/16   Florencia Reasons, MD  torsemide (DEMADEX) 20 MG tablet Take 40 mg by mouth daily. If pt weighs over 200lbs give an additional 40mg s to the regular 40mg  daily dose on all days other than Monday and Friday.    Historical Provider, MD   BP 108/60 mmHg  Pulse 67  Temp(Src) 98.4 F (36.9 C) (Oral)  Resp 18  SpO2 97% Physical Exam  Constitutional: He is oriented to person, place, and time. He appears well-developed.  HENT:  Head: Atraumatic.  Neck: Neck supple.  Cardiovascular: Normal rate.   Pulmonary/Chest: Effort normal.  Neurological: He is alert  and oriented to person, place, and time.  Skin: Skin is warm.  Nursing note and vitals reviewed.   ED Course  Procedures (including critical care time) Labs Review Labs Reviewed  CBC - Abnormal; Notable for the following:    WBC 51.0 (*)    RBC 2.41 (*)    Hemoglobin 7.3 (*)    HCT 22.0 (*)    RDW 18.8 (*)    Platelets 31 (*)    All other components within normal limits  BASIC METABOLIC PANEL -  Abnormal; Notable for the following:    Chloride 99 (*)    Glucose, Bld 125 (*)    BUN 56 (*)    Creatinine, Ser 1.90 (*)    GFR calc non Af Amer 29 (*)    GFR calc Af Amer 33 (*)    All other components within normal limits    Imaging Review No results found. I have personally reviewed and evaluated these images and lab results as part of my medical decision-making.   EKG Interpretation None      MDM   Final diagnoses:  Myelodysplasia    Pt comes in with cc of CBC evaluation. Pt is not symptomatic. He required intermittent transfusion of blood - last one was 2 weeks ago. There is an outpatient arrangement in place for the transfusion. Pt has run into some unexpected complications this week, as there were some antibodies that delayed getting type specific blood. They were also told that Hb < 6 = not safe for transfusion at the infusion center.  - Will check CBC. - Will call Cancer center to see if blood is ready for tomorrow/  10:50 AM Pt and his care givers informed that hte Hb is 7.2. Also informed that there is blood ready at the Cancer center for tomorrow. With Hb 7.2, no symptoms of acute anemia, we will d/c pt for transfusion tomorrow.     Varney Biles, MD 04/05/16 1051

## 2016-04-05 NOTE — ED Notes (Signed)
His private duty nurse phoned EMS to have pt. Transported to hospital to have hemoglobin check.  She told EMS that pt. Has leukemia and that he is to receive a transfusion tomorrow in our infusion center at the [facility].  Pt. Arrives here in no distress and with no c/o.  He is normally on O2 at 3 l.p.m. 24 hours/day, which we maintain.

## 2016-04-05 NOTE — ED Notes (Signed)
His caregivers verbalize plan to receive transfusion as scheduled tomorrow at [facility].  I had phoned [facility] blood bank to confirm ready units and they did indeed confirm that they have two units ready for him to receive tomorrow, of which Dr. Lenoria Chime was informed.  His blood band I.D. (Blue bracelet) is fastidiously maintained and is in place upon his departure.

## 2016-04-05 NOTE — ED Notes (Signed)
He remains in no distress in the presence of his three private duty nurses.  We are awaiting the arrival of their vehicle with oxygen for his d/c home; which they state will be here soon.

## 2016-04-06 ENCOUNTER — Other Ambulatory Visit (HOSPITAL_COMMUNITY): Payer: Self-pay | Admitting: *Deleted

## 2016-04-06 ENCOUNTER — Encounter (HOSPITAL_COMMUNITY)
Admission: RE | Admit: 2016-04-06 | Discharge: 2016-04-06 | Disposition: A | Discharge status: Home / Self Care | Payer: Medicare Other | Source: Ambulatory Visit | Attending: Internal Medicine | Admitting: Internal Medicine

## 2016-04-06 DIAGNOSIS — D469 Myelodysplastic syndrome, unspecified: Secondary | ICD-10-CM | POA: Diagnosis not present

## 2016-04-06 DIAGNOSIS — D696 Thrombocytopenia, unspecified: Secondary | ICD-10-CM | POA: Diagnosis not present

## 2016-04-06 DIAGNOSIS — D500 Iron deficiency anemia secondary to blood loss (chronic): Secondary | ICD-10-CM | POA: Diagnosis not present

## 2016-04-06 LAB — TYPE AND SCREEN
ABO/RH(D): O POS
ANTIBODY SCREEN: POSITIVE
DAT, IGG: POSITIVE
Donor AG Type: NEGATIVE
Donor AG Type: NEGATIVE
Unit division: 0
Unit division: 0

## 2016-04-06 LAB — PREPARE RBC (CROSSMATCH)

## 2016-04-06 MED ORDER — SODIUM CHLORIDE 0.9 % IV SOLN: Freq: Once | INTRAVENOUS | Status: DC

## 2016-04-06 NOTE — Progress Notes (Signed)
Report received from [place]; client with blood transfusing

## 2016-04-07 LAB — TYPE AND SCREEN
ABO/RH(D): O POS
ANTIBODY SCREEN: POSITIVE
DAT, IGG: POSITIVE
DONOR AG TYPE: NEGATIVE
Donor AG Type: NEGATIVE
UNIT DIVISION: 0
UNIT DIVISION: 0

## 2016-04-08 ENCOUNTER — Encounter (HOSPITAL_COMMUNITY): Payer: Medicare Other

## 2016-04-14 ENCOUNTER — Encounter (HOSPITAL_COMMUNITY)
Admission: RE | Admit: 2016-04-14 | Discharge: 2016-04-14 | Disposition: A | Discharge status: Home / Self Care | Payer: Medicare Other | Source: Ambulatory Visit | Attending: Internal Medicine | Admitting: Internal Medicine

## 2016-04-14 DIAGNOSIS — D469 Myelodysplastic syndrome, unspecified: Secondary | ICD-10-CM | POA: Diagnosis not present

## 2016-04-14 LAB — PREPARE RBC (CROSSMATCH)

## 2016-04-15 ENCOUNTER — Other Ambulatory Visit (HOSPITAL_COMMUNITY): Payer: Self-pay | Admitting: *Deleted

## 2016-04-16 ENCOUNTER — Encounter (HOSPITAL_COMMUNITY)
Admission: RE | Admit: 2016-04-16 | Discharge: 2016-04-16 | Disposition: A | Discharge status: Home / Self Care | Payer: Medicare Other | Source: Ambulatory Visit | Attending: Internal Medicine | Admitting: Internal Medicine

## 2016-04-16 DIAGNOSIS — D469 Myelodysplastic syndrome, unspecified: Secondary | ICD-10-CM | POA: Diagnosis not present

## 2016-04-16 MED ORDER — SODIUM CHLORIDE 0.9 % IV SOLN: Freq: Once | INTRAVENOUS | Status: DC

## 2016-04-17 LAB — TYPE AND SCREEN
ABO/RH(D): O POS
ANTIBODY SCREEN: POSITIVE
DAT, IGG: POSITIVE
Donor AG Type: NEGATIVE
Donor AG Type: NEGATIVE
UNIT DIVISION: 0
Unit division: 0

## 2016-05-02 DEATH — deceased

## 2016-11-20 IMAGING — DX DG CHEST 2V
2 series · 2 of 2 positions shown · non-contrast
Comparison: 09/15/2015

CLINICAL DATA: Cough and sob x 1 month

EXAM:
CHEST  2 VIEW

[chest lat]
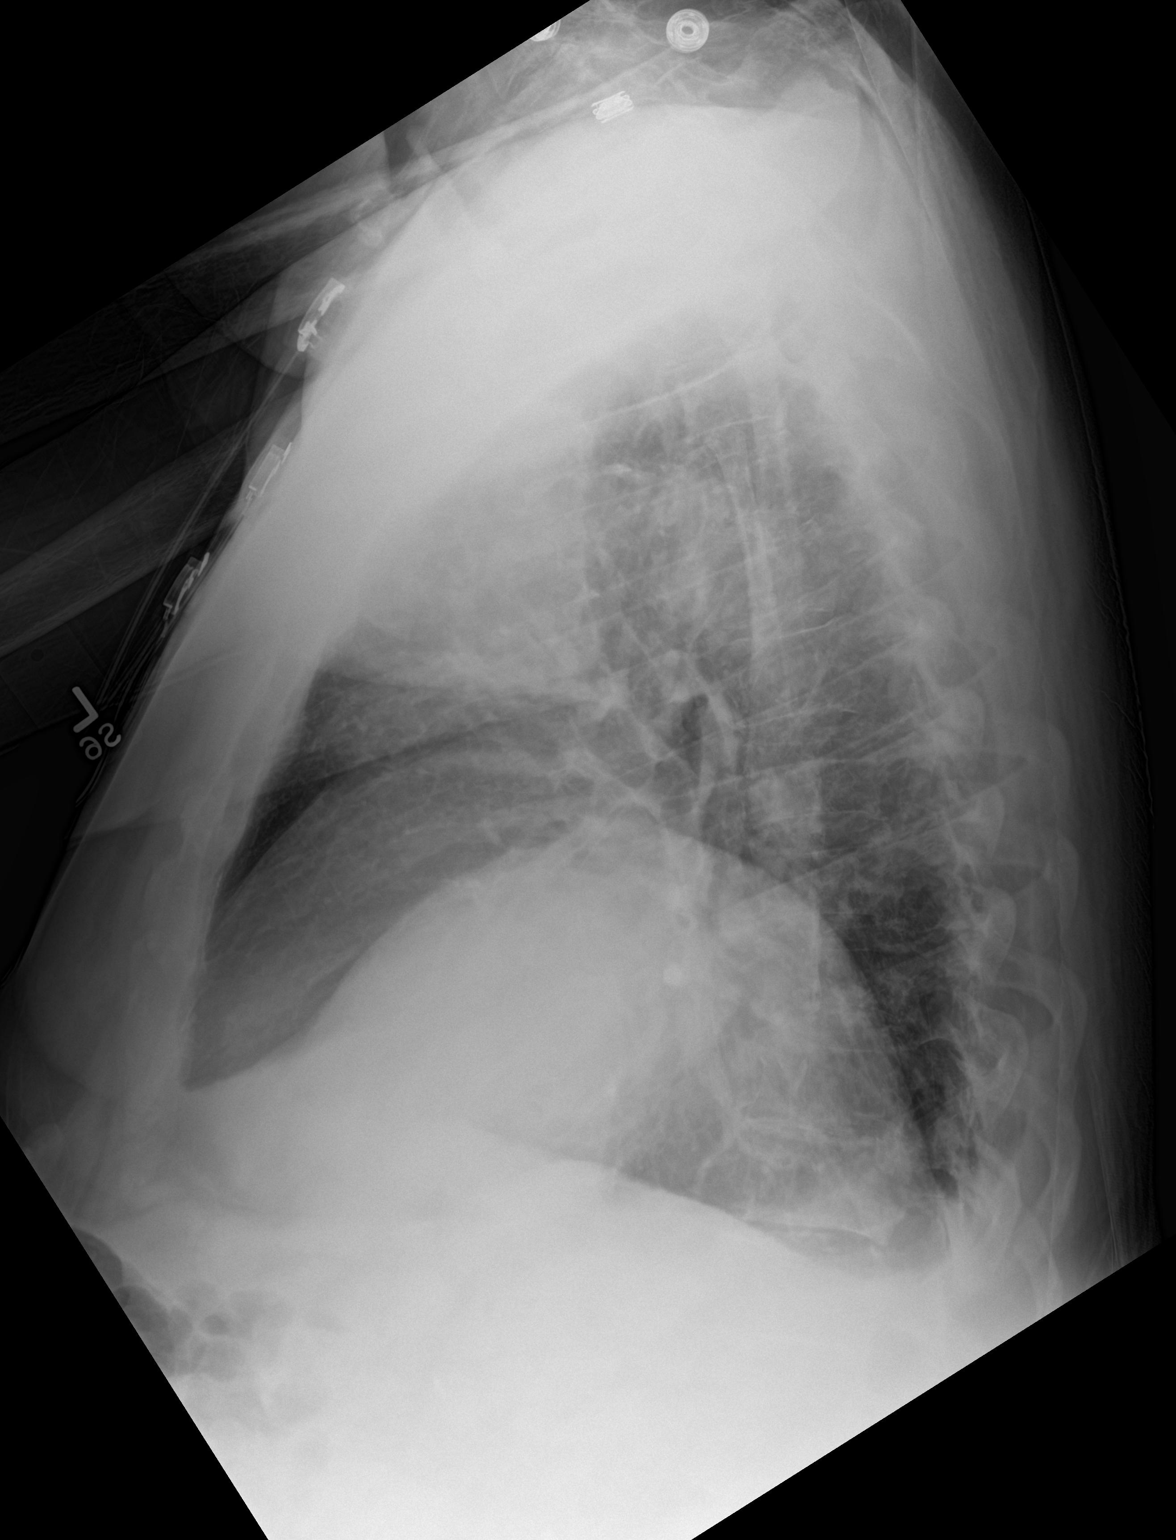

[chest ap]
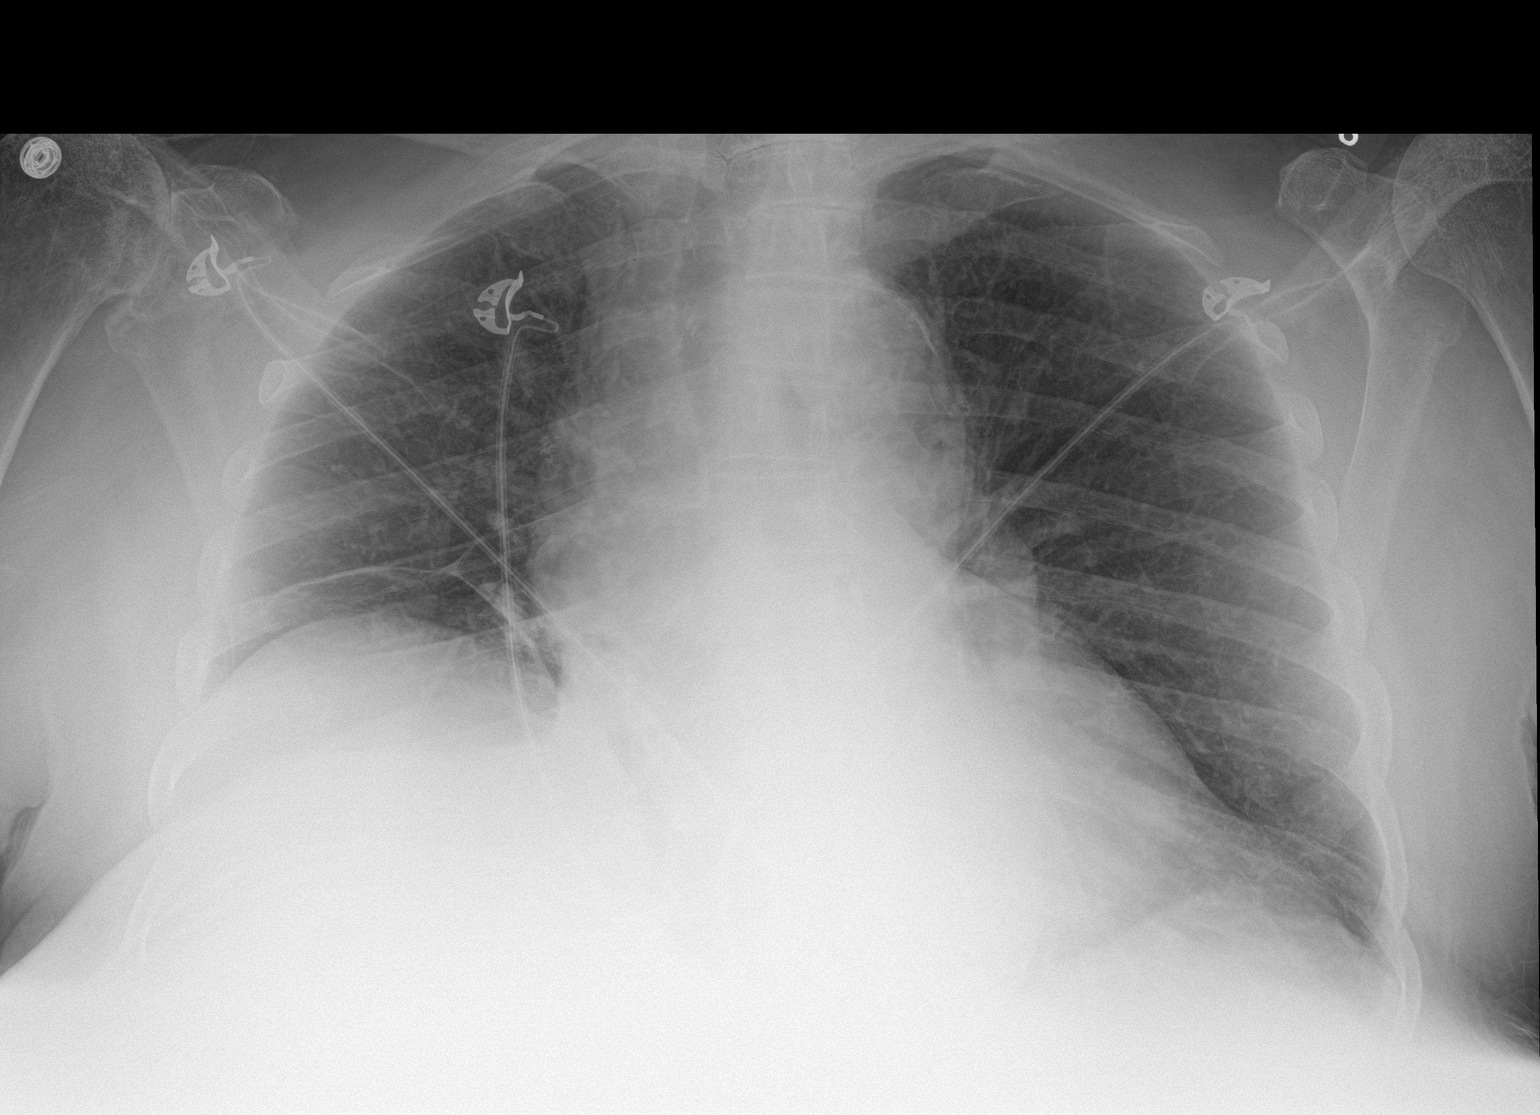

[2 of 2 positions shown; findings below may reference images not displayed]

FINDINGS: Lateral view degraded by patient arm position. Moderate right
hemidiaphragm elevation. High-riding humeral heads, consistent with
chronic rotator cuff insufficiency. Patient minimally rotated to the
right on the frontal. Cardiomegaly with a tortuous thoracic aorta.
No pleural effusion or pneumothorax. Atelectasis at the lung bases,
worse on the right. No congestive failure. Transverse aortic
atherosclerosis.
IMPRESSION: No acute cardiopulmonary disease.

Cardiomegaly without congestive failure.

Right hemidiaphragm elevation with bibasilar atelectasis, worse at
the right lung base.

## 2017-04-13 IMAGING — DX DG CHEST 2V
2 series · 2 of 2 positions shown · non-contrast
Comparison: Report of 12/03/2015 ; images not available.

CLINICAL DATA: Elevated white blood cell count. Hypertension.
Coronary artery disease

EXAM:
CHEST  2 VIEW

[chest lat]
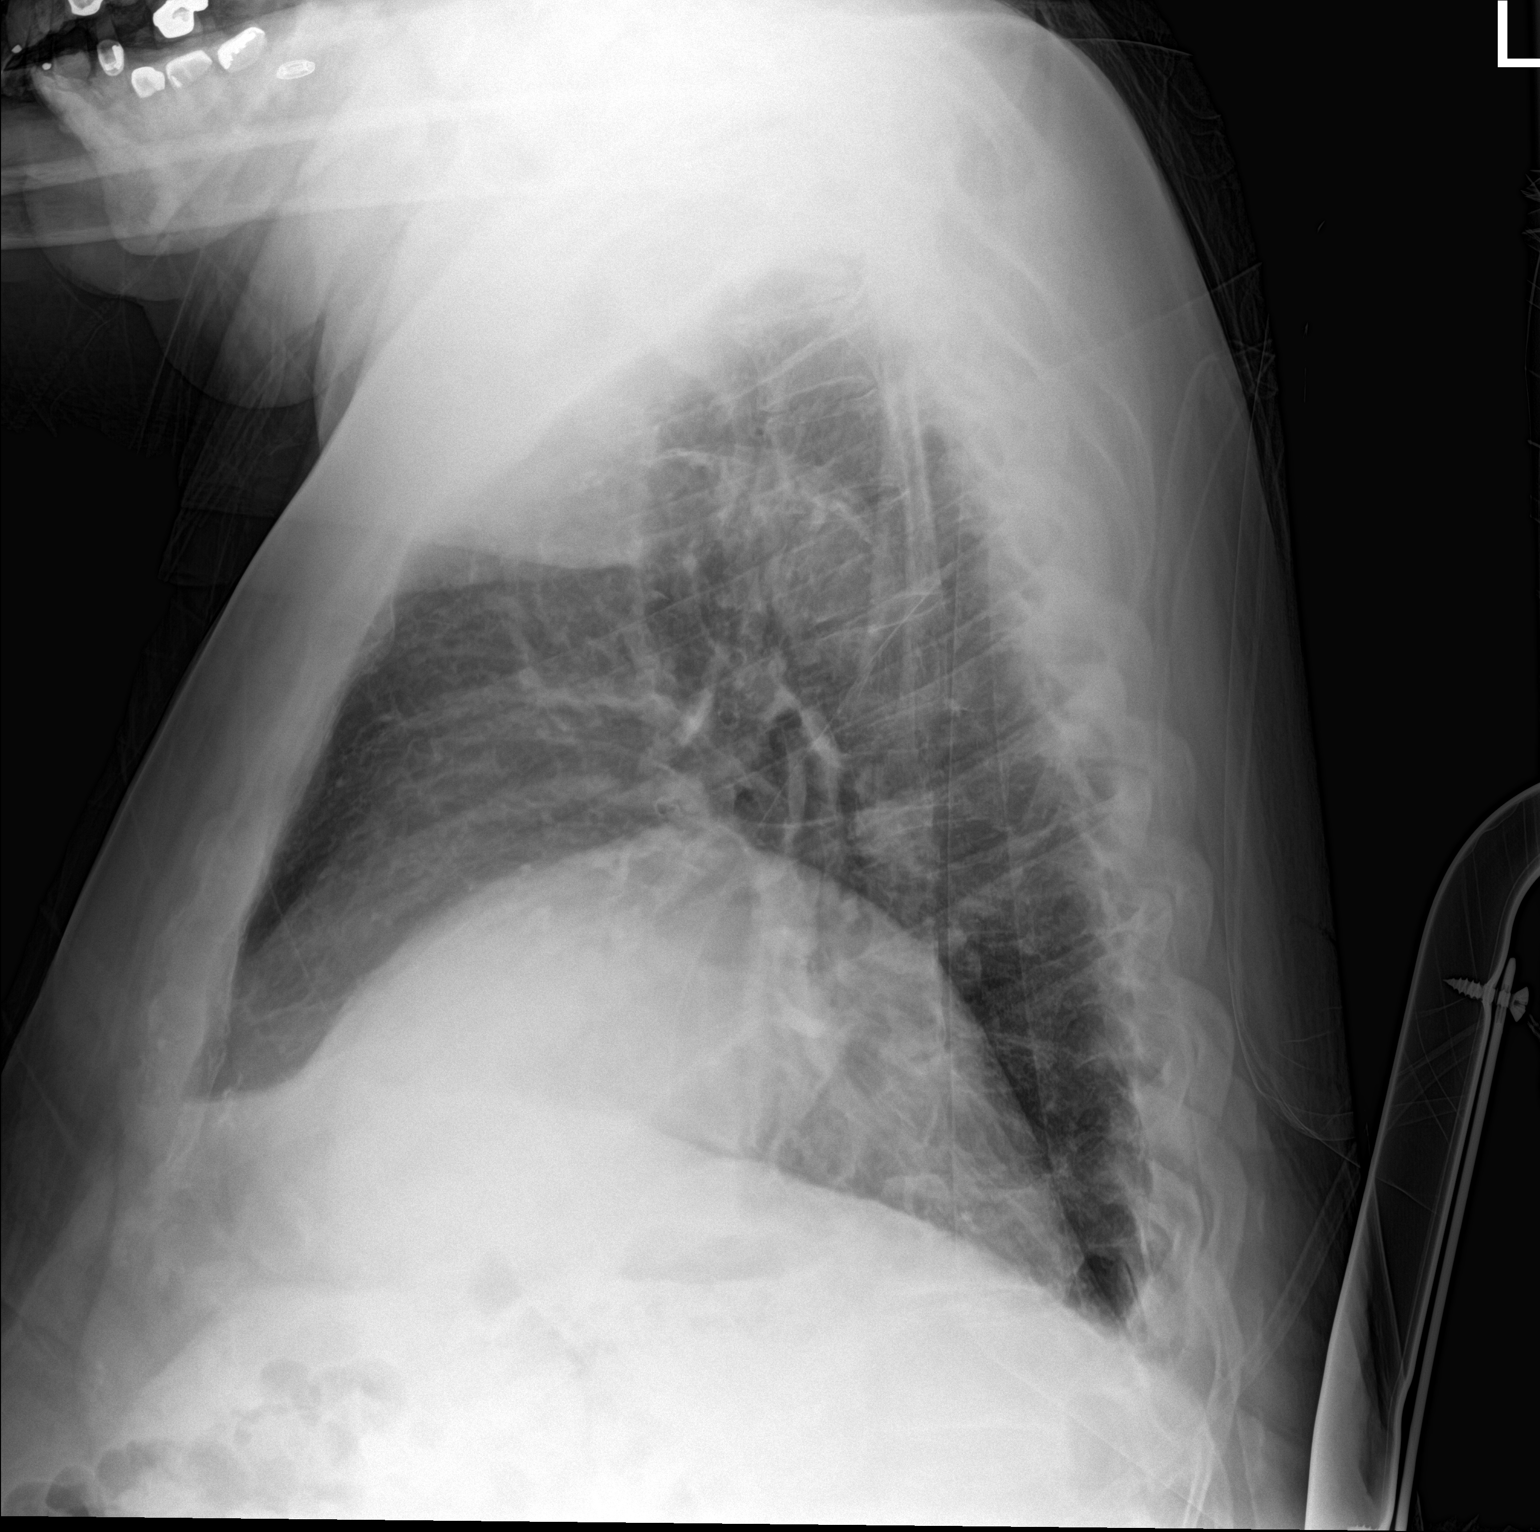

[chest ap]
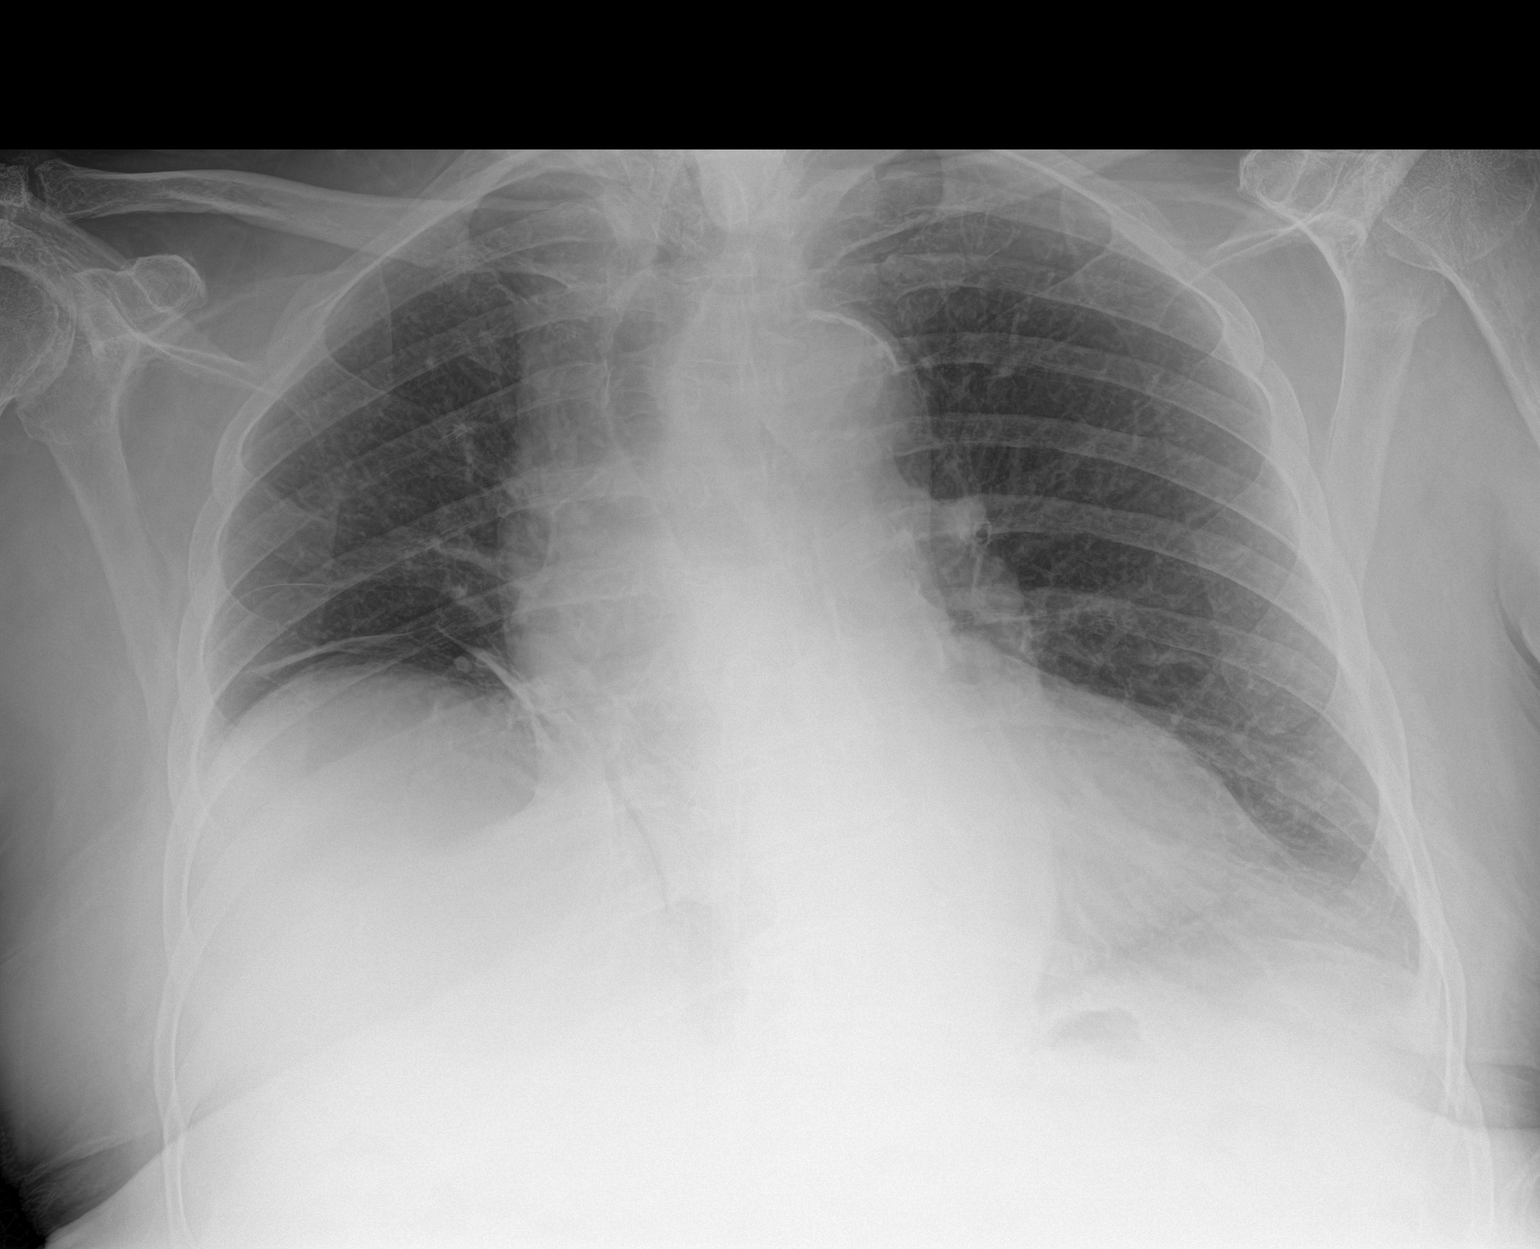

[2 of 2 positions shown; findings below may reference images not displayed]

FINDINGS: Patient rotated right on the frontal. Midline trachea. Cardiomegaly
with a tortuous thoracic aorta. Transverse aortic atherosclerosis.
Moderate right hemidiaphragm eventration. No pleural effusion or
pneumothorax. Bibasilar volume loss. No congestive failure. No lobar
consolidation.
IMPRESSION: Cardiomegaly and right hemidiaphragm eventration. No acute findings.

## 2017-04-20 IMAGING — DX DG CHEST 2V
2 series · 2 of 2 positions shown · non-contrast
Comparison: 02/11/2016

CLINICAL DATA: Weakness.  Anemia.

EXAM:
CHEST  2 VIEW

[w chest lat]
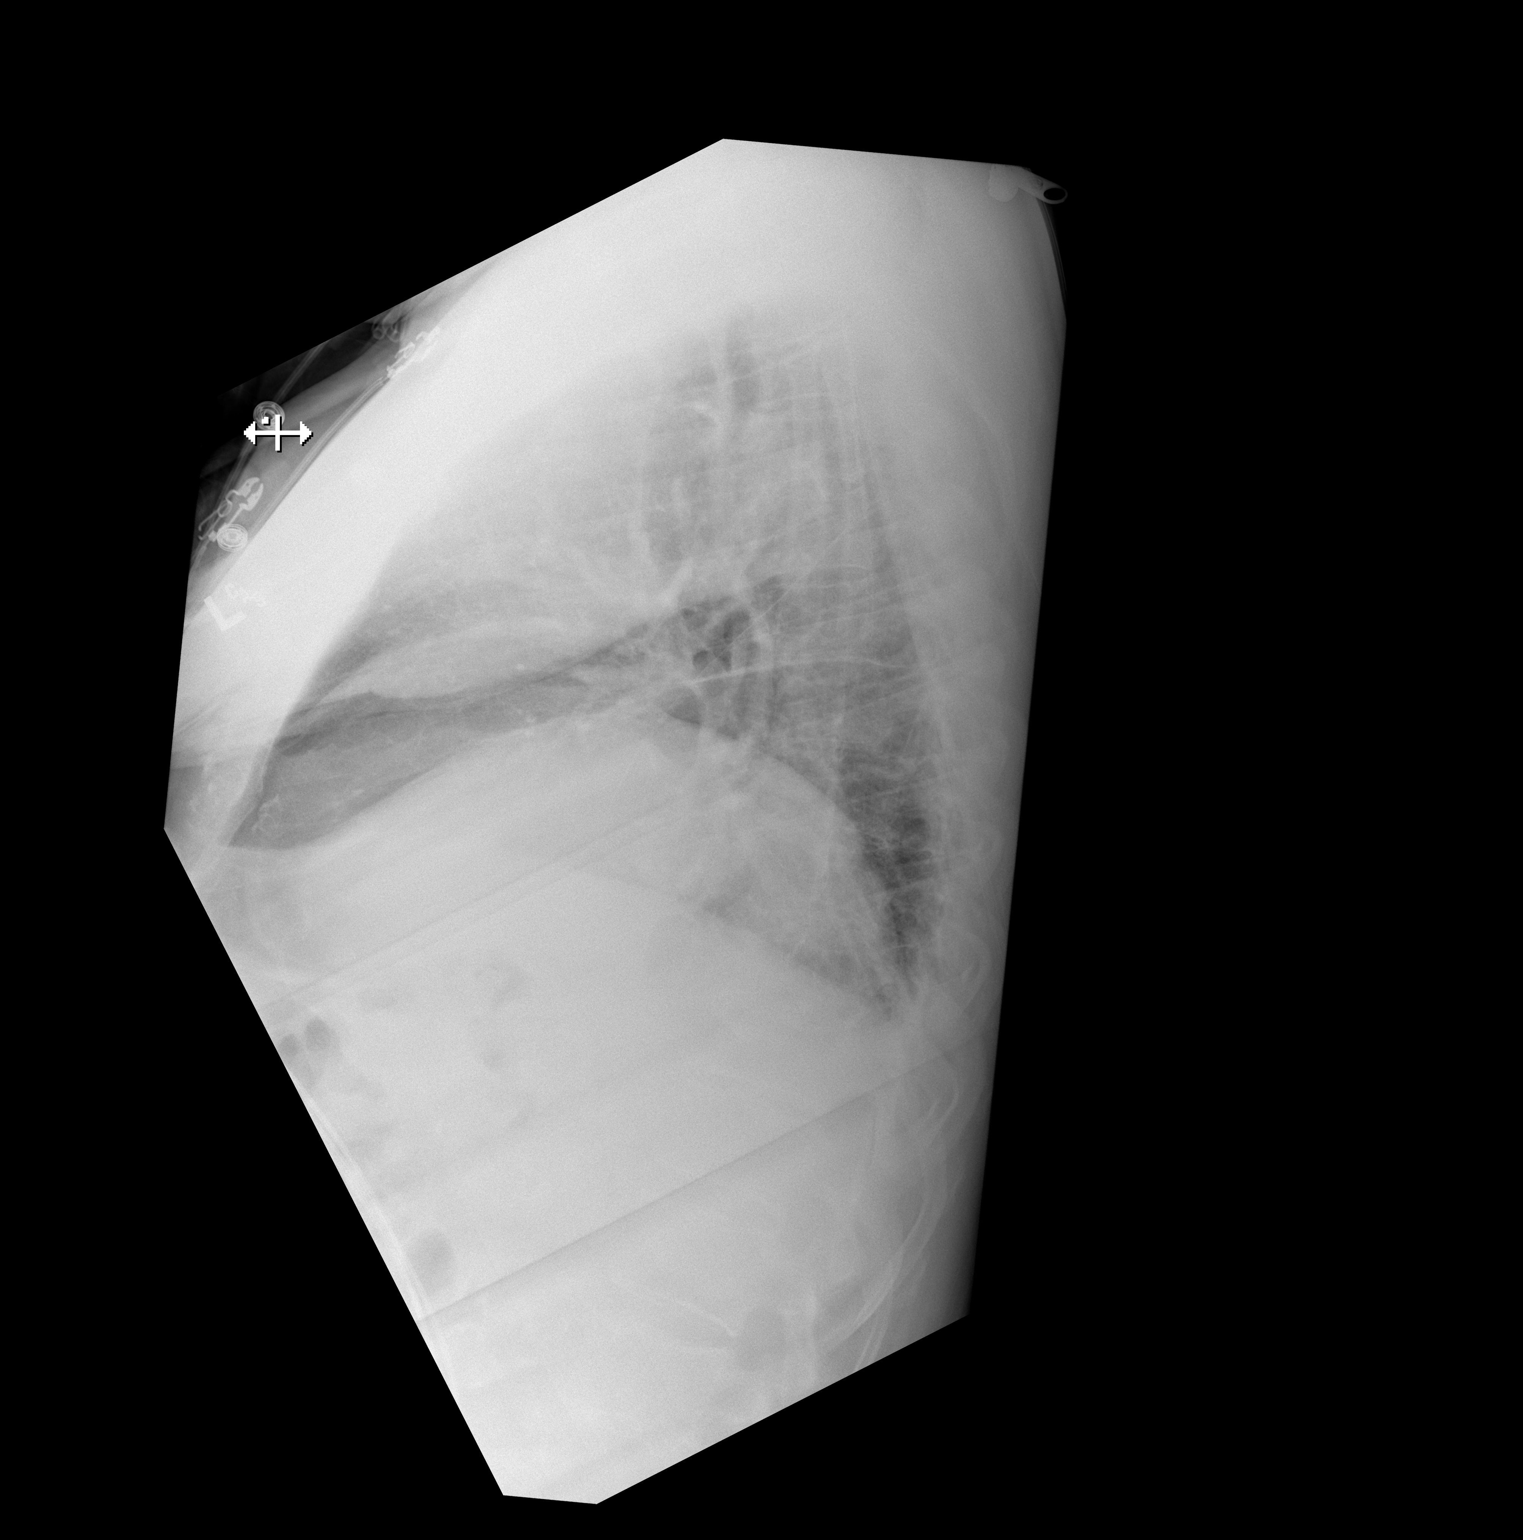

[x chest ap]
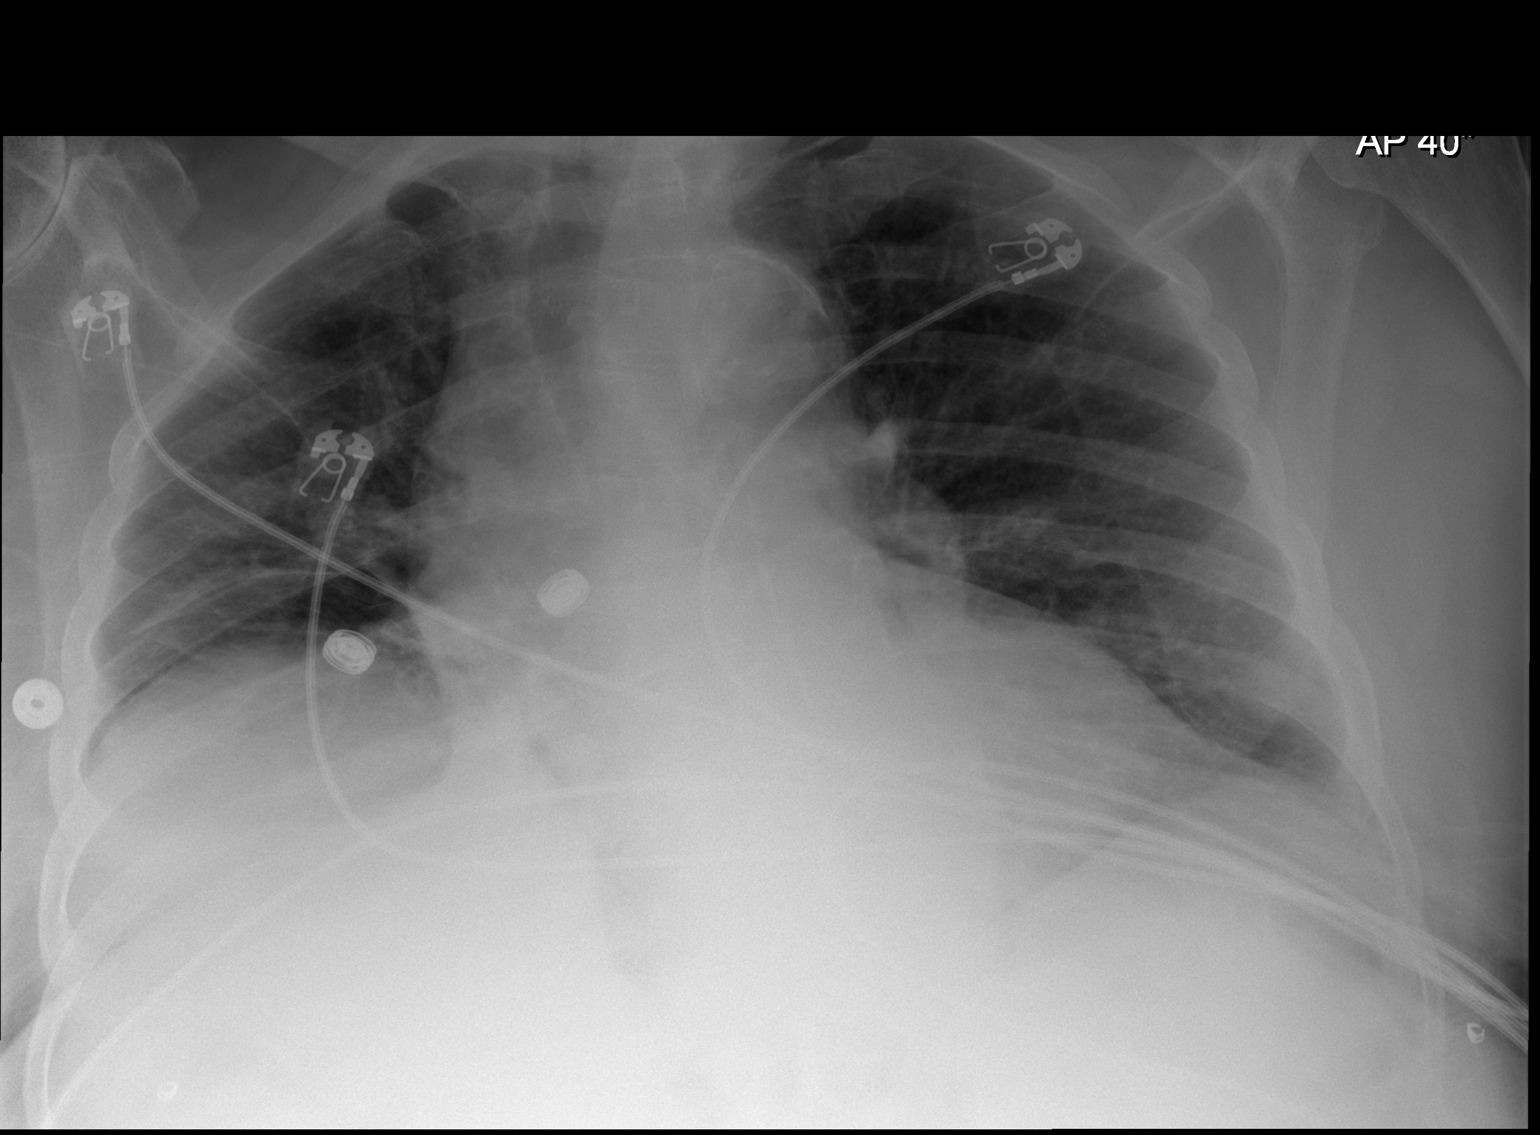

[2 of 2 positions shown; findings below may reference images not displayed]

FINDINGS: Elevated right hemidiaphragm with bandlike atelectasis along the
right lung base. Generally low lung volumes.

Atherosclerotic aortic arch. Borderline enlargement of the
cardiopericardial silhouette.

Degenerative right glenohumeral arthropathy
IMPRESSION: 1. Elevated right hemidiaphragm with chronic right basilar
atelectasis similar to prior exams.
2. Low lung volumes are present, causing crowding of the pulmonary
vasculature.
3. Borderline enlargement of the cardiopericardial silhouette.
4. Atherosclerotic aortic arch.
